# Patient Record
Sex: Male | Born: 1960 | Race: Black or African American | Hispanic: No | Marital: Married | Smoking: Never smoker
Health system: Southern US, Community
[De-identification: ages and names within clinical notes are randomized; demographics above are authoritative.]

## PROBLEM LIST (undated history)

## (undated) DIAGNOSIS — E119 Type 2 diabetes mellitus without complications: Secondary | ICD-10-CM

## (undated) DIAGNOSIS — F4310 Post-traumatic stress disorder, unspecified: Secondary | ICD-10-CM

## (undated) DIAGNOSIS — S46912A Strain of unspecified muscle, fascia and tendon at shoulder and upper arm level, left arm, initial encounter: Secondary | ICD-10-CM

## (undated) DIAGNOSIS — N189 Chronic kidney disease, unspecified: Secondary | ICD-10-CM

## (undated) DIAGNOSIS — G459 Transient cerebral ischemic attack, unspecified: Secondary | ICD-10-CM

## (undated) DIAGNOSIS — I10 Essential (primary) hypertension: Secondary | ICD-10-CM

## (undated) DIAGNOSIS — I2510 Atherosclerotic heart disease of native coronary artery without angina pectoris: Secondary | ICD-10-CM

## (undated) DIAGNOSIS — E785 Hyperlipidemia, unspecified: Secondary | ICD-10-CM

## (undated) DIAGNOSIS — G43909 Migraine, unspecified, not intractable, without status migrainosus: Secondary | ICD-10-CM

## (undated) DIAGNOSIS — K589 Irritable bowel syndrome without diarrhea: Secondary | ICD-10-CM

## (undated) DIAGNOSIS — M25373 Other instability, unspecified ankle: Secondary | ICD-10-CM

## (undated) DIAGNOSIS — M722 Plantar fascial fibromatosis: Secondary | ICD-10-CM

## (undated) DIAGNOSIS — K760 Fatty (change of) liver, not elsewhere classified: Secondary | ICD-10-CM

## (undated) DIAGNOSIS — G4730 Sleep apnea, unspecified: Secondary | ICD-10-CM

## (undated) DIAGNOSIS — M5410 Radiculopathy, site unspecified: Secondary | ICD-10-CM

## (undated) HISTORY — DX: Other instability, unspecified ankle: M25.373

## (undated) HISTORY — DX: Essential (primary) hypertension: I10

## (undated) HISTORY — DX: Atherosclerotic heart disease of native coronary artery without angina pectoris: I25.10

## (undated) HISTORY — DX: Migraine, unspecified, not intractable, without status migrainosus: G43.909

## (undated) HISTORY — DX: Radiculopathy, site unspecified: M54.10

## (undated) HISTORY — DX: Irritable bowel syndrome, unspecified: K58.9

## (undated) HISTORY — DX: Hyperlipidemia, unspecified: E78.5

## (undated) HISTORY — DX: Chronic kidney disease, unspecified: N18.9

## (undated) HISTORY — DX: Transient cerebral ischemic attack, unspecified: G45.9

## (undated) HISTORY — DX: Fatty (change of) liver, not elsewhere classified: K76.0

## (undated) HISTORY — DX: Strain of unspecified muscle, fascia and tendon at shoulder and upper arm level, left arm, initial encounter: S46.912A

## (undated) HISTORY — DX: Plantar fascial fibromatosis: M72.2

## (undated) HISTORY — DX: Sleep apnea, unspecified: G47.30

## (undated) HISTORY — PX: BUNIONECTOMY: SHX129

## (undated) HISTORY — DX: Type 2 diabetes mellitus without complications: E11.9

## (undated) HISTORY — DX: Post-traumatic stress disorder, unspecified: F43.10

---

## 2019-06-21 HISTORY — PX: COLONOSCOPY: SHX174

## 2019-11-21 NOTE — Progress Notes (Signed)
Primary Physician/Referring:  Reynold Bowen, MD  Patient ID: William Savage, male    DOB: [birth year], 59 y.o.   MRN: [id number]  Chief Complaint  Patient presents with  . Chest Pain  . New Patient (Initial Visit)   HPI:    William Savage  is a 59 y.o. African-American ex [employer], hypertension, hyperlipidemia referred to me on urgent basis due to abnormal EKG and chest pain.  Over the past 3 to 4 weeks, patient started noticing chest heaviness in the middle of the chest with stressful situations.  Spontaneously after 15 to 20 minutes.  Described as mild.  He is also noticed episodes of chest tightness with sexual intercourse, last episode was last night.  Again easily relieved with rest.  No shortness of breath, no palpitations, no PND or orthopnea.  Wife present.  Past Medical History:  Diagnosis Date  . Hyperlipidemia   . Hypertension    History reviewed. No pertinent surgical history. Family History  Problem Relation Age of Onset  . Diabetes Mother   . Stroke Mother   . Hypertension Mother   . Unexplained death Father   . Hypertension Sister   . Diabetes Sister   . Hypertension Sister   . Diabetes Sister     Social History   Tobacco Use  . Smoking status: Never Smoker  . Smokeless tobacco: Never Used  Substance Use Topics  . Alcohol use: Not Currently   Marital Status:    ROS  Review of Systems  Cardiovascular: Positive for chest pain. Negative for dyspnea on exertion, leg swelling and syncope.  Gastrointestinal: Negative for melena.   Objective  Blood pressure (!) 172/99, pulse 61, resp. rate 16, height 6' (1.829 m), weight 239 lb (108.4 kg), SpO2 97 %.  Vitals with BMI 11/22/2019 11/22/2019  Height - _0   Weight - 239 lbs  BMI - 63.14  Systolic 970 263  Diastolic 99 785  Pulse 61 68     Physical Exam  Constitutional: He appears well-developed and well-nourished. No distress.  muscular  Cardiovascular: Normal rate, regular rhythm, normal heart sounds and  intact distal pulses. Exam reveals no gallop.  No murmur heard. No leg edema, no JVD.   Pulmonary/Chest: Effort normal and breath sounds normal. No accessory muscle usage.  Abdominal: Soft. Bowel sounds are normal.   Laboratory examination:   No results for input(s): NA, K, CL, CO2, GLUCOSE, BUN, CREATININE, CALCIUM, GFRNONAA, GFRAA in the last 8760 hours. CrCl cannot be calculated (No successful lab value found.).  No flowsheet data found. No flowsheet data found.  External labs:   Lipid Panel completed 10/30/2019 Cholesterol, total 235.000 m 10/30/2019 Triglycerides 238.000 10/30/2019 HDL 39 MG/DL 10/30/2019 LDL 148.000 m 10/30/2019  Glucose Random 85.000 mg 10/30/2019 BUN 12.000 mg 10/30/2019 Creatinine, Serum 1.200 mg/ 10/30/2019  10/30/2019: EGFR 63/76, Na 140.  Medications and allergies  No Known Allergies   Current Outpatient Medications  Medication Instructions  . amLODipine (NORVASC) 10 mg, Oral, Every evening  . aspirin 81 mg, Oral, Daily  . colchicine 0.6 mg, Oral, Daily, As needed for gout   . losartan-hydrochlorothiazide (HYZAAR) 50-12.5 MG tablet 1 tablet, Oral,  Every morning - 10a  . prazosin (MINIPRESS) 1 mg, Oral, Daily at bedtime  . Psyllium (METAMUCIL) 48.57 % POWD Oral  . rosuvastatin (CRESTOR) 20 mg, Oral, Daily  . sertraline (ZOLOFT) 100 mg, Oral, Daily   Radiology:   No results found.  Cardiac Studies:   None  EKG  EKG 11/22/2019:  Normal sinus rhythm with rate of 57 bpm, normal axis, T wave abnormality noted on 11/21/2019 (PCP)  in the inferior leads no longer present.  Normal EKG.  Assessment     ICD-10-CM   1. Angina pectoris (HCC)  I20.9 EKG 12-Lead    PCV MYOCARDIAL PERFUSION WO LEXISCAN    PCV ECHOCARDIOGRAM COMPLETE  2. Primary hypertension  I10 amLODipine (NORVASC) 10 MG tablet    Basic metabolic panel    losartan-hydrochlorothiazide (HYZAAR) 50-12.5 MG tablet    PCV MYOCARDIAL PERFUSION WO LEXISCAN    PCV ECHOCARDIOGRAM COMPLETE  3.  Hypercholesteremia  E78.00 PCV MYOCARDIAL PERFUSION WO LEXISCAN    rosuvastatin (CRESTOR) 20 MG tablet     Recommendations:   William Savage  is a 59 y.o. African-American ex [employer], hypertension, hyperlipidemia referred to me on urgent basis due to abnormal EKG and chest pain.  Over the past 3 to 4 weeks, patient started noticing chest heaviness in the middle of the chest with stressful situations and also during sexual intercourse.   His blood pressure is elevated, lipids reviewed from Dr. Forde Dandy, concerned about his symptoms which have been indicator of angina pectoris.  Will increase amlodipine to 10 mg in the evening, added losartan HCT 50/12.5 mg in the morning.  Will increase Crestor that was prescribed yesterday from 5 mg to 20 mg in view of suspicion for new onset angina pectoris to be aggressive with risk modification.  Eventually he may need only 10 mg.  Changes with lifestyle including diet discussed with the patient and his wife.  Continue aspirin 81 mg that he started yesterday.  Schedule for a Exercise Nuclear stress test to evaluate for myocardial ischemia. Will schedule for an echocardiogram. OV in 4-6 weeks if stable or sooner if recurrent symptoms or high risk stress test.    Adrian Prows, MD, [facility] 11/22/2019, 11:59 AM [facility] [place] Pager: [phone] Office: [phone]

## 2019-11-22 ENCOUNTER — Ambulatory Visit: Payer: Federal, State, Local not specified - PPO | Admitting: Cardiology

## 2019-11-22 ENCOUNTER — Other Ambulatory Visit: Payer: Self-pay

## 2019-11-22 ENCOUNTER — Encounter: Payer: Self-pay | Admitting: Cardiology

## 2019-11-22 VITALS — BP 172/99 | HR 61 | Resp 16 | Ht 72.0 in | Wt 239.0 lb

## 2019-11-22 DIAGNOSIS — I10 Essential (primary) hypertension: Secondary | ICD-10-CM

## 2019-11-22 DIAGNOSIS — I209 Angina pectoris, unspecified: Secondary | ICD-10-CM

## 2019-11-22 DIAGNOSIS — E7800 Pure hypercholesterolemia, unspecified: Secondary | ICD-10-CM

## 2019-11-22 MED ORDER — AMLODIPINE BESYLATE 10 MG PO TABS: 10.0000 mg | ORAL_TABLET | Freq: Every evening | ORAL | 1 refills | Status: DC

## 2019-11-22 MED ORDER — ROSUVASTATIN CALCIUM 20 MG PO TABS: 20.0000 mg | ORAL_TABLET | Freq: Every day | ORAL | 2 refills | Status: DC

## 2019-11-22 MED ORDER — LOSARTAN POTASSIUM-HCTZ 50-12.5 MG PO TABS: 1.0000 | ORAL_TABLET | Freq: Every morning | ORAL | 2 refills | Status: DC

## 2019-11-28 ENCOUNTER — Ambulatory Visit: Payer: Federal, State, Local not specified - PPO

## 2019-11-28 ENCOUNTER — Other Ambulatory Visit: Payer: Self-pay

## 2019-11-28 DIAGNOSIS — I10 Essential (primary) hypertension: Secondary | ICD-10-CM

## 2019-11-28 DIAGNOSIS — I209 Angina pectoris, unspecified: Secondary | ICD-10-CM

## 2019-11-30 LAB — BASIC METABOLIC PANEL
BUN/Creatinine Ratio: 11 (ref 9–20)
BUN: 12 mg/dL (ref 6–24)
CO2: 26 mmol/L (ref 20–29)
Calcium: 10 mg/dL (ref 8.7–10.2)
Chloride: 103 mmol/L (ref 96–106)
Creatinine, Ser: 1.11 mg/dL (ref 0.76–1.27)
GFR calc Af Amer: 85 mL/min/{1.73_m2} (ref >59)
GFR calc non Af Amer: 74 mL/min/{1.73_m2} (ref >59)
Glucose: 84 mg/dL (ref 65–99)
Potassium: 4.3 mmol/L (ref 3.5–5.2)
Sodium: 142 mmol/L (ref 134–144)

## 2019-12-09 ENCOUNTER — Other Ambulatory Visit: Payer: Federal, State, Local not specified - PPO

## 2019-12-18 ENCOUNTER — Other Ambulatory Visit: Payer: Self-pay

## 2019-12-18 ENCOUNTER — Ambulatory Visit: Payer: Federal, State, Local not specified - PPO

## 2019-12-18 DIAGNOSIS — I10 Essential (primary) hypertension: Secondary | ICD-10-CM

## 2019-12-18 DIAGNOSIS — I209 Angina pectoris, unspecified: Secondary | ICD-10-CM

## 2019-12-18 DIAGNOSIS — E7800 Pure hypercholesterolemia, unspecified: Secondary | ICD-10-CM

## 2019-12-24 NOTE — Telephone Encounter (Signed)
From pt please read

## 2020-01-02 ENCOUNTER — Other Ambulatory Visit: Payer: Self-pay

## 2020-01-02 ENCOUNTER — Encounter: Payer: Self-pay | Admitting: Cardiology

## 2020-01-02 ENCOUNTER — Ambulatory Visit: Payer: Federal, State, Local not specified - PPO | Admitting: Cardiology

## 2020-01-02 VITALS — BP 141/84 | HR 61 | Resp 16 | Ht 72.0 in | Wt 241.4 lb

## 2020-01-02 DIAGNOSIS — I209 Angina pectoris, unspecified: Secondary | ICD-10-CM

## 2020-01-02 DIAGNOSIS — E7800 Pure hypercholesterolemia, unspecified: Secondary | ICD-10-CM

## 2020-01-02 DIAGNOSIS — I10 Essential (primary) hypertension: Secondary | ICD-10-CM

## 2020-01-02 DIAGNOSIS — N522 Drug-induced erectile dysfunction: Secondary | ICD-10-CM

## 2020-01-02 DIAGNOSIS — G4733 Obstructive sleep apnea (adult) (pediatric): Secondary | ICD-10-CM

## 2020-01-02 MED ORDER — ROSUVASTATIN CALCIUM 20 MG PO TABS: 20.0000 mg | ORAL_TABLET | Freq: Every day | ORAL | 2 refills | Status: DC

## 2020-01-02 MED ORDER — SILDENAFIL CITRATE 100 MG PO TABS: 100.0000 mg | ORAL_TABLET | Freq: Every day | ORAL | 3 refills | Status: DC | PRN

## 2020-01-02 MED ORDER — NITROGLYCERIN 0.4 MG SL SUBL: 0.4000 mg | SUBLINGUAL_TABLET | SUBLINGUAL | 3 refills | Status: DC | PRN

## 2020-01-02 MED ORDER — LOSARTAN POTASSIUM-HCTZ 100-12.5 MG PO TABS: 1.0000 | ORAL_TABLET | ORAL | 3 refills | Status: DC

## 2020-01-02 NOTE — Progress Notes (Signed)
Primary Physician/Referring:  Reynold Bowen, MD  Patient ID: William Savage, male    DOB: 1960/09/09, 59 y.o.   MRN: [id number]  Chief Complaint  Patient presents with   Hypertension   Chest Pain   Follow-up    6 week   HPI:    William Savage  is a 59 y.o. African-American ex [employer], hypertension, hyperlipidemia referred to me on urgent basis due to abnormal EKG and chest pain.  Over the past 3 to 4 weeks, patient started noticing chest heaviness in the middle of the chest with stressful situations.  Spontaneously after 15 to 20 minutes.  Described as mild.  He is also noticed episodes of chest tightness with sexual intercourse, last episode was last night.  Again easily relieved with rest.  No shortness of breath, no palpitations, no PND or orthopnea.  Wife present.  I seen him 6 weeks ago for symptoms suggestive of angina, nuclear stress test in June 2021 and echocardiogram June 2021 was low risk and normal respectively.  He has noticed improvement in symptoms of angina since increasing the dose of Crestor and also blood pressure control.  He complains of erectile dysfunction that has been ongoing for a while and requests help.  Past Medical History:  Diagnosis Date   Hyperlipidemia    Hypertension    History reviewed. No pertinent surgical history. Family History  Problem Relation Age of Onset   Diabetes Mother    Stroke Mother    Hypertension Mother    Unexplained death Father    Hypertension Sister    Diabetes Sister    Hypertension Sister    Diabetes Sister     Social History   Tobacco Use   Smoking status: Never Smoker   Smokeless tobacco: Never Used  Substance Use Topics   Alcohol use: Not Currently   Marital Status:    ROS  Review of Systems  Cardiovascular: Positive for chest pain. Negative for dyspnea on exertion, leg swelling and syncope.  Respiratory: Positive for snoring (has diagnosis of OSA ).   Gastrointestinal: Negative for melena.   Genitourinary: Positive for decreased libido.   Objective  Blood pressure (!) 141/84, pulse 61, resp. rate 16, height 6' (1.829 m), weight 241 lb 6.4 oz (109.5 kg), SpO2 97 %.  Vitals with BMI 01/02/2020 01/02/2020 11/22/2019  Height - 6' 0"  -  Weight - 241 lbs 6 oz -  BMI - 40.98 -  Systolic 119 147 829  Diastolic 84 93 99  Pulse 61 62 61     Physical Exam Constitutional:      General: He is not in acute distress.    Appearance: He is well-developed.     Comments: muscular  Cardiovascular:     Rate and Rhythm: Normal rate and regular rhythm.     Pulses: Intact distal pulses.     Heart sounds: Normal heart sounds. No murmur heard.  No gallop.      Comments: No leg edema, no JVD.  Pulmonary:     Effort: Pulmonary effort is normal. No accessory muscle usage.     Breath sounds: Normal breath sounds.  Abdominal:     General: Bowel sounds are normal.     Palpations: Abdomen is soft.    Laboratory examination:   Recent Labs    11/29/19 1147  NA 142  K 4.3  CL 103  CO2 26  GLUCOSE 84  BUN 12  CREATININE 1.11  CALCIUM 10.0  GFRNONAA 74  GFRAA 85  CrCl cannot be calculated (Patient's most recent lab result is older than the maximum 21 days allowed.).  CMP Latest Ref Rng & Units 11/29/2019  Glucose 65 - 99 mg/dL 84  BUN 6 - 24 mg/dL 12  Creatinine 0.76 - 1.27 mg/dL 1.11  Sodium 134 - 144 mmol/L 142  Potassium 3.5 - 5.2 mmol/L 4.3  Chloride 96 - 106 mmol/L 103  CO2 20 - 29 mmol/L 26  Calcium 8.7 - 10.2 mg/dL 10.0   No flowsheet data found.  External labs:   Lipid Panel completed 10/30/2019 Cholesterol, total 235.000 m 10/30/2019 Triglycerides 238.000 10/30/2019 HDL 39 MG/DL 10/30/2019 LDL 148.000 m 10/30/2019  Glucose Random 85.000 mg 10/30/2019 BUN 12.000 mg 10/30/2019 Creatinine, Serum 1.200 mg/ 10/30/2019  10/30/2019: EGFR 63/76, Na 140.  Medications and allergies  No Known Allergies   Current Outpatient Medications  Medication Instructions   amLODipine  (NORVASC) 10 mg, Oral, Every evening   aspirin 81 mg, Oral, Daily   colchicine 0.6 mg, Oral, Daily, As needed for gout    losartan-hydrochlorothiazide (HYZAAR) 100-12.5 MG tablet 1 tablet, Oral, BH-each morning   nitroGLYCERIN (NITROSTAT) 0.4 mg, Sublingual, Every 5 min PRN   prazosin (MINIPRESS) 1 mg, Oral, Daily at bedtime   Psyllium (METAMUCIL) 48.57 % POWD Oral   rosuvastatin (CRESTOR) 20 mg, Oral, Daily   sertraline (ZOLOFT) 100 mg, Oral, Daily   sildenafil (VIAGRA) 100 mg, Oral, Daily PRN   Radiology:   No results found.  Cardiac Studies:   Echocardiogram 11/28/2019:  Normal LV systolic function with visual EF 60-65%. Left ventricle cavity  is normal in size. Mild to moderate hypertrophy of the left ventricle.  Normal global wall motion. Normal diastolic filling pattern, normal LAP.  Calculated EF 59%.  Mild pulmonic regurgitation.  No prior study for comparison.  Exercise Sestamibi stress test 12/18/2019: Exercise nuclear stress test was performed using Bruce protocol. Patient reached 13.2 METS, and 88% of age predicted maximum heart rate. Exercise capacity was excellent. Chest pain not reported. Heart rate and hemodynamic response were normal. Stress EKG revealed no ischemic changes. Normal myocardial perfusion. Stress LVEF 53%.  EKG  EKG 11/22/2019: Normal sinus rhythm with rate of 57 bpm, normal axis, T wave abnormality noted on 11/21/2019 (PCP)  in the inferior leads no longer present.  Normal EKG.  Assessment     ICD-10-CM   1. Angina pectoris ([place])  I20.9 Ambulatory referral to Sleep Studies    nitroGLYCERIN (NITROSTAT) 0.4 MG SL tablet  2. OSA (obstructive sleep apnea)  G47.33 Ambulatory referral to Sleep Studies  3. Primary hypertension  I10 losartan-hydrochlorothiazide (HYZAAR) 100-12.5 MG tablet  4. Drug-induced erectile dysfunction  N52.2 sildenafil (VIAGRA) 100 MG tablet  5. Hypercholesteremia  E78.00 rosuvastatin (CRESTOR) 20 MG tablet    Lipid  Panel With LDL/HDL Ratio    Lipid Panel With LDL/HDL Ratio     Recommendations:   William Savage  is a 59 y.o. African-American ex [employer], hypertension, hyperlipidemia referred to me on urgent basis due to abnormal EKG and chest pain.  Over the past 3 to 4 weeks, patient started noticing chest heaviness in the middle of the chest with stressful situations and also during sexual intercourse.  Since increasing the dose of Crestor and antihypertensive medications, amlodipine from 5 to 10 mg and addition of losartan HCT 50/12.5 mg, he has noticed improvement in symptoms of chest pain, he is still having occasional episodes of exertional chest pain and also chest pain during intercourse.  Today the blood  pressure is still elevated but much improved.  Will increase losartan HCT to 100/12.5 mg every morning.  Continue Crestor 40 mg daily and also amlodipine 10 mg daily.  For erectile dysfunction I have prescribed Viagra and advised him regarding interaction with sublingual nitroglycerin which I prescribed today for angina pectoris to be used on a as needed basis.  He has diagnosis of sleep apnea but not using CPAP.  He continues to have fatigue and elevated blood pressure may be also be related to sleep apnea.  In view of angina pectoris and hypertension, I will refer him to be evaluated by Dr. Rexene Alberts.  I would like to see him back in 3 months or sooner if problems.  I will obtain lipid profile testing prior to his office visit.  This was a 40-minute encounter with discussions regarding angina pectoris, sleep apnea, hypertension, obesity.   Adrian Prows, MD, [facility] 01/02/2020, 12:52 PM Office: [phone]

## 2020-02-10 ENCOUNTER — Institutional Professional Consult (permissible substitution): Payer: Self-pay | Admitting: Neurology

## 2020-02-17 ENCOUNTER — Other Ambulatory Visit: Payer: Self-pay | Admitting: Cardiology

## 2020-03-08 ENCOUNTER — Encounter: Payer: Self-pay | Admitting: Neurology

## 2020-03-09 ENCOUNTER — Ambulatory Visit: Payer: Federal, State, Local not specified - PPO | Admitting: Neurology

## 2020-03-09 ENCOUNTER — Other Ambulatory Visit: Payer: Self-pay

## 2020-03-09 ENCOUNTER — Encounter: Payer: Self-pay | Admitting: Neurology

## 2020-03-09 VITALS — BP 126/82 | HR 62 | Ht 72.0 in | Wt 236.3 lb

## 2020-03-09 DIAGNOSIS — E669 Obesity, unspecified: Secondary | ICD-10-CM

## 2020-03-09 DIAGNOSIS — R635 Abnormal weight gain: Secondary | ICD-10-CM | POA: Diagnosis not present

## 2020-03-09 DIAGNOSIS — G4733 Obstructive sleep apnea (adult) (pediatric): Secondary | ICD-10-CM | POA: Diagnosis not present

## 2020-03-09 DIAGNOSIS — G4719 Other hypersomnia: Secondary | ICD-10-CM

## 2020-03-09 DIAGNOSIS — Z9989 Dependence on other enabling machines and devices: Secondary | ICD-10-CM

## 2020-03-09 NOTE — Patient Instructions (Signed)
It was nice to meet you today.  As discussed, I would like for you to have a local DME company which is a durable medical equipment company that would keep you updated with all your AutoPap related supplies. I will request that your maximum pressure be reduced so you have less feeling of getting too much air. Please try to use your machine consistently every night.  It would help to get a copy of your original sleep study from 2018.  If need be, we can consider repeating your sleep study since you also had some weight fluctuation.  Please follow-up to see one of our nurse practitioners in 3 months.

## 2020-03-09 NOTE — Progress Notes (Signed)
Subjective:    Patient ID: William Savage is a 59 y.o. male.  HPI     William Foley, MD, PhD [facility] [address], [address] [address] [place], [place] [address]  Dear Vonna Kotyk,   I saw your patient, William Savage, upon your kind request, in my sleep clinic today for Initial consultation of his sleep disorder, in particular, evaluation of his prior diagnosis of sleep apnea.  The patient is accompanied by his wife today.  As you know, William Savage is a 59 year old right-handed gentleman with an underlying medical history of hypertension, hyperlipidemia, angina, ED, and obesity, who was previously diagnosed with obstructive sleep apnea and placed on PAP therapy.  He has not been using his machine consistently. He brought his autoPAP machine and I was able to review his compliance: In the past 90 days from 12/10/2019 through 03/08/2020 he used his machine 16 days with percent use days greater than 4 hours at 13% only, indicating suboptimal compliance, he recently restarted using his machine on 02/09/2020. Residual AHI is 0.3/h, average pressure for the 95th percentile at 8.6 cm with a maximum of 10 cm, range of 4 to 20 cm which is the default setting. 95th percentile of leak at 8.4 L/min. He uses a full facemask. He reports that he got this machine in 2018 and has not received any new supplies. Prior sleep study results are not available for my review today, [place] in [place].  I reviewed your office note from 01/02/2020.  His Epworth sleepiness score is 18 out of 24, fatigue severity score is 44 out of 63. He reports not waking up rested. He has sleep disruption. He reports that he has nightmares at times. He also reports that he has PTSD. Generally, he tries to be in bed around midnight and wake up time is around 8 AM. He has nocturia about once or twice per average night. He does not drink alcohol, is a non-smoker and drinks caffeine in limitation, about 1cup/day in the  morning. He has had some weight gain in the past year, around 12 pounds.   His Past Medical History Is Significant For: Past Medical History:  Diagnosis Date  . Hyperlipidemia   . Hypertension     His Past Surgical History Is Significant For: No past surgical history on file.  His Family History Is Significant For: Family History  Problem Relation Age of Onset  . Diabetes Mother   . Stroke Mother   . Hypertension Mother   . Unexplained death Father   . Hypertension Sister   . Diabetes Sister   . Hypertension Sister   . Diabetes Sister     His Social History Is Significant For: Social History   Socioeconomic History  . Marital status: Married    Spouse name: Not on file  . Number of children: 2  . Years of education: Not on file  . Highest education level: Not on file  Occupational History  . Not on file  Tobacco Use  . Smoking status: Never Smoker  . Smokeless tobacco: Never Used  Vaping Use  . Vaping Use: Never used  Substance and Sexual Activity  . Alcohol use: Not Currently  . Drug use: Never  . Sexual activity: Not on file  Other Topics Concern  . Not on file  Social History Narrative  . Not on file   Social Determinants of Health   Financial Resource Strain:   . Difficulty of Paying Living Expenses: Not  on file  Food Insecurity:   . Worried About Programme researcher, broadcasting/film/video in the Last Year: Not on file  . Ran Out of Food in the Last Year: Not on file  Transportation Needs:   . Lack of Transportation (Medical): Not on file  . Lack of Transportation (Non-Medical): Not on file  Physical Activity:   . Days of Exercise per Week: Not on file  . Minutes of Exercise per Session: Not on file  Stress:   . Feeling of Stress : Not on file  Social Connections:   . Frequency of Communication with Friends and Family: Not on file  . Frequency of Social Gatherings with Friends and Family: Not on file  . Attends Religious Services: Not on file  . Active Member of  Clubs or Organizations: Not on file  . Attends Banker Meetings: Not on file  . Marital Status: Not on file    His Allergies Are:  No Known Allergies:   His Current Medications Are:  Outpatient Encounter Medications as of 03/09/2020  Medication Sig  . amLODipine (NORVASC) 10 MG tablet Take 1 tablet (10 mg total) by mouth every evening.  Marland Kitchen aspirin 81 MG chewable tablet Chew 81 mg by mouth daily.  . colchicine 0.6 MG tablet Take 0.6 mg by mouth daily. As needed for gout  . losartan-hydrochlorothiazide (HYZAAR) 100-12.5 MG tablet Take 1 tablet by mouth every morning.  Marland Kitchen losartan-hydrochlorothiazide (HYZAAR) 50-12.5 MG tablet TAKE 1 TABLET BY MOUTH EVERY MORNING.  . prazosin (MINIPRESS) 1 MG capsule Take 1 mg by mouth at bedtime.  . Psyllium (METAMUCIL) 48.57 % POWD Take by mouth.  . rosuvastatin (CRESTOR) 20 MG tablet Take 1 tablet (20 mg total) by mouth daily.  . sertraline (ZOLOFT) 100 MG tablet Take 100 mg by mouth daily.  . sildenafil (VIAGRA) 100 MG tablet Take 1 tablet (100 mg total) by mouth daily as needed for erectile dysfunction.  . nitroGLYCERIN (NITROSTAT) 0.4 MG SL tablet Place 1 tablet (0.4 mg total) under the tongue every 5 (five) minutes as needed for up to 25 days for chest pain.   No facility-administered encounter medications on file as of 03/09/2020.  :  Review of Systems:  Out of a complete 14 point review of systems, all are reviewed and negative with the exception of these symptoms as listed below: Review of Systems  Neurological:       Here for sleep consult. Pt reports prior sleep study and has been on cpap for a few years. Pt reports he has struggled to use consistently since. He currently does not have a local DME and would like to discuss getting set up with one.  Epworth Sleepiness Scale 0= would never doze 1= slight chance of dozing 2= moderate chance of dozing 3= high chance of dozing  Sitting and reading:3 Watching TV:3 Sitting inactive  in a public place (ex. Theater or meeting):1 As a passenger in a car for an hour without a break:2 Lying down to rest in the afternoon:3 Sitting and talking to someone:1 Sitting quietly after lunch (no alcohol):3 In a car, while stopped in traffic:2 Total: 18     Objective:  Neurological Exam  Physical Exam Physical Examination:   Vitals:   03/09/20 1104  BP: 126/82  Pulse: 62  SpO2: 97%    General Examination: The patient is a very pleasant 59 y.o. male in no acute distress. He appears well-developed and well-nourished and well groomed.   HEENT: Normocephalic, atraumatic, pupils  are equal, round and reactive to light, extraocular tracking is good without limitation to gaze excursion or nystagmus noted. Hearing is grossly intact. Face is symmetric with normal facial animation. Speech is clear with no dysarthria noted. There is no hypophonia. There is no lip, neck/head, jaw or voice tremor. Neck is supple with full range of passive and active motion. There are no carotid bruits on auscultation. Oropharynx exam reveals: moderate mouth dryness, adequate dental hygiene and moderate airway crowding. Tongue protrudes centrally and palate elevates symmetrically. Neck circumference is 16-1/4 inches.  Chest: Clear to auscultation without wheezing, rhonchi or crackles noted.  Heart: S1+S2+0, regular and normal without murmurs, rubs or gallops noted.   Abdomen: Soft, non-tender and non-distended with normal bowel sounds appreciated on auscultation.  Extremities: There is no obv. Edema.  Skin: Warm and dry without trophic changes noted.   Musculoskeletal: exam reveals some discomfort reported in the left knee as well as low back pain reported.  Neurologically:  Mental status: The patient is awake, alert and oriented in all 4 spheres. His immediate and remote memory, attention, language skills and fund of knowledge are fairly appropriate. There is no evidence of aphasia, agnosia, apraxia  or anomia. Speech is clear with normal prosody and enunciation. Thought process is linear. Mood is normal.  Cranial nerves II - XII are as described above under HEENT exam.  Motor exam: Normal bulk, strength and tone is noted. There is no tremor, fine motor skills and coordination: grossly intact.  Cerebellar testing: No dysmetria or intention tremor. There is no truncal or gait ataxia.   Gait, station and balance:  stands easily. No veering to one side is noted. No leaning to one side is noted. Posture is age-appropriate and stance is narrow based. Gait shows normal stride length and normal pace. No problems turning are noted.   Assessment and Plan:   In summary, William Savage is a very pleasant 59 y.o.-year old male with an underlying medical history of hypertension, hyperlipidemia, angina, ED, and obesity, who presents for evaluation of his sleep disorder, in particular, his prior diagnosis of obstructive sleep apnea. He has been on AutoPap therapy, recently restarted using the machine, has not been consistent with the AutoPap machine. He reports discomfort with the pressure. He has a full facemask. He is advised that we could seek a mask refit and pressure reduction through a local DME company. He is willing to establish with a local company. We may need a copy of his sleep study. I certainly would like to know the severity of his sleep apnea. Per his recollection he had moderate sleep apnea. Sleep study testing was in 2018. If need be, he would be willing to get reevaluated with another sleep study. For now, we will see if we can have him send [place] a copy of his prior sleep study results. He would be willing to try to get a copy. He reports that testing was done through [facility] in [place] in 2018. I suggested follow-up with one of our nurse practitioners in about 2 to 3 months to reevaluate his symptoms and compliance. In the interim we will send his DME order to a local DME company. I answered  all their questions today and the patient and his wife are in agreement.  Thank you very much for allowing me to participate in the care of this nice patient. If I can be of any further assistance to you please do not hesitate to call me at [phone].  Sincerely,   Star Age, MD, PhD

## 2020-04-08 ENCOUNTER — Encounter: Payer: Self-pay | Admitting: Cardiology

## 2020-04-08 ENCOUNTER — Other Ambulatory Visit: Payer: Self-pay

## 2020-04-08 ENCOUNTER — Ambulatory Visit: Payer: Federal, State, Local not specified - PPO | Admitting: Cardiology

## 2020-04-08 VITALS — BP 120/86 | HR 59 | Resp 14 | Ht 72.0 in | Wt 236.6 lb

## 2020-04-08 DIAGNOSIS — E7800 Pure hypercholesterolemia, unspecified: Secondary | ICD-10-CM

## 2020-04-08 DIAGNOSIS — G4733 Obstructive sleep apnea (adult) (pediatric): Secondary | ICD-10-CM

## 2020-04-08 DIAGNOSIS — I10 Essential (primary) hypertension: Secondary | ICD-10-CM

## 2020-04-08 NOTE — Progress Notes (Signed)
Primary Physician/Referring:  Reynold Bowen, MD  Patient ID: William Savage, male    DOB: 11-26-1960, 59 y.o.   MRN: [id number]  Chief Complaint  Patient presents with  . Chest Pain  . Hypertension  . Follow-up    3 month   HPI:    William Savage  is a 59 y.o. African-American ex [employer], hypertension, hyperlipidemia originally referred to our office due to abnormal EKG and chest pain.  Nuclear stress test and echocardiogram in June 2021 were low risk and normal respectively.  Symptoms have improved with medical management and blood pressure control.    Patient presents for 42-monthfollow-up.  At last visit increase losartan/HCTZ to 100/12.5 daily and prescribed Viagra for erectile dysfunction.  Patient reports he is presently doing well, without recurrence of anginal symptoms and erectile dysfunction has improved with use of Viagra.  Denies chest pain, shortness of breath, palpitations, PND, orthopnea.  He has been diagnosed with sleep apnea in the past, however he only uses his CPAP 2-3 times per week.  He is following with Dr. ARexene Alberts who is working on adjusting CPAP machine settings and mask for improved patient compliance.  Patient does not check his blood pressure regularly at home.  He has not used nitroglycerin since last visit.  Past Medical History:  Diagnosis Date  . Hyperlipidemia   . Hypertension    History reviewed. No pertinent surgical history. Family History  Problem Relation Age of Onset  . Diabetes Mother   . Stroke Mother   . Hypertension Mother   . Unexplained death Father   . Hypertension Sister   . Diabetes Sister   . Hypertension Sister   . Diabetes Sister     Social History   Tobacco Use  . Smoking status: Never Smoker  . Smokeless tobacco: Never Used  Substance Use Topics  . Alcohol use: Not Currently   Marital Status:    ROS  Review of Systems  Constitutional: Negative for malaise/fatigue.  Cardiovascular: Negative for chest pain, dyspnea on  exertion, leg swelling, near-syncope, orthopnea, palpitations, paroxysmal nocturnal dyspnea and syncope.  Respiratory: Positive for snoring (has diagnosis of OSA , not compliant with CPAP).   Gastrointestinal: Negative for melena.  Neurological: Negative for dizziness.   Objective  Blood pressure 135/88, pulse (!) 59, resp. rate 14, height 6' (1.829 m), weight 236 lb 9.6 oz (107.3 kg), SpO2 99 %.  Vitals with BMI 04/08/2020 03/09/2020 01/02/2020  Height 6' 0"  6' 0"  -  Weight 236 lbs 10 oz 236 lbs 5 oz -  BMI 376.54365.03-  Systolic [id number] Diastolic 88 82 84  Pulse 59 62 61     Physical Exam Vitals reviewed.  Constitutional:      General: He is not in acute distress.    Appearance: He is well-developed.     Comments: muscular  HENT:     Head: Normocephalic and atraumatic.  Cardiovascular:     Rate and Rhythm: Normal rate and regular rhythm.     Pulses: Intact distal pulses.     Heart sounds: Normal heart sounds, S1 normal and S2 normal. No murmur heard.  No gallop.      Comments: No leg edema, no JVD.  Pulmonary:     Effort: Pulmonary effort is normal. No accessory muscle usage or respiratory distress.     Breath sounds: Normal breath sounds. No wheezing, rhonchi or rales.  Abdominal:     General: Bowel sounds are normal.  Palpations: Abdomen is soft.  Musculoskeletal:     Right lower leg: No edema.     Left lower leg: No edema.  Neurological:     Mental Status: He is alert.    Laboratory examination:   CMP Latest Ref Rng & Units 11/29/2019  Glucose 65 - 99 mg/dL 84  BUN 6 - 24 mg/dL 12  Creatinine 0.76 - 1.27 mg/dL 1.11  Sodium 134 - 144 mmol/L 142  Potassium 3.5 - 5.2 mmol/L 4.3  Chloride 96 - 106 mmol/L 103  CO2 20 - 29 mmol/L 26  Calcium 8.7 - 10.2 mg/dL 10.0   No flowsheet data found. Lipid Panel  No results found for: CHOL, TRIG, HDL, CHOLHDL, VLDL, LDLCALC, LDLDIRECT HEMOGLOBIN A1C No results found for: HGBA1C, MPG TSH No results for input(s):  TSH in the last 8760 hours.  No flowsheet data found.  External labs:  03/25/2020:  Total cholesterol 131, triglycerides 86, HDL 36, LDL 78 BUN 12, creatinine 1.2, GFR 62, sodium 140, potassium 4.2,  BMP otherwise normal  Lipid Panel completed 10/30/2019 Cholesterol, total 235.000 m 10/30/2019 Triglycerides 238.000 10/30/2019 HDL 39 MG/DL 10/30/2019 LDL 148.000 m 10/30/2019  Glucose Random 85.000 mg 10/30/2019 BUN 12.000 mg 10/30/2019 Creatinine, Serum 1.200 mg/ 10/30/2019  10/30/2019: EGFR 63/76, Na 140.  Medications and allergies  No Known Allergies   Current Outpatient Medications  Medication Instructions  . amLODipine (NORVASC) 10 mg, Oral, Every evening  . aspirin 81 mg, Oral, Daily  . colchicine 0.6 mg, Oral, Daily, As needed for gout   . losartan-hydrochlorothiazide (HYZAAR) 100-12.5 MG tablet 1 tablet, Oral, BH-each morning  . losartan-hydrochlorothiazide (HYZAAR) 50-12.5 MG tablet TAKE 1 TABLET BY MOUTH EVERY MORNING.  . nitroGLYCERIN (NITROSTAT) 0.4 mg, Sublingual, Every 5 min PRN  . prazosin (MINIPRESS) 1 mg, Oral, Daily at bedtime  . Psyllium (METAMUCIL) 48.57 % POWD Oral  . rosuvastatin (CRESTOR) 20 mg, Oral, Daily  . sertraline (ZOLOFT) 100 mg, Oral, Daily  . sildenafil (VIAGRA) 100 mg, Oral, Daily PRN   Radiology:   No results found.  Cardiac Studies:   Echocardiogram 11/28/2019:  Normal LV systolic function with visual EF 60-65%. Left ventricle cavity  is normal in size. Mild to moderate hypertrophy of the left ventricle.  Normal global wall motion. Normal diastolic filling pattern, normal LAP.  Calculated EF 59%.  Mild pulmonic regurgitation.  No prior study for comparison.  Exercise Sestamibi stress test 12/18/2019: Exercise nuclear stress test was performed using Bruce protocol. Patient reached 13.2 METS, and 88% of age predicted maximum heart rate. Exercise capacity was excellent. Chest pain not reported. Heart rate and hemodynamic response were normal.  Stress EKG revealed no ischemic changes. Normal myocardial perfusion. Stress LVEF 53%.  EKG  EKG 11/22/2019: Normal sinus rhythm with rate of 57 bpm, normal axis, T wave abnormality noted on 11/21/2019 (PCP)  in the inferior leads no longer present.  Normal EKG.  Assessment     ICD-10-CM   1. Primary hypertension  I10   2. Hypercholesteremia  E78.00   3. OSA (obstructive sleep apnea)  G47.33      Recommendations:   William Savage  is a 59 y.o. African-American ex [employer], hypertension, hyperlipidemia originally referred to our office due to abnormal EKG and chest pain.  Nuclear stress test and echocardiogram in June 2021 were low risk and normal respectively.  Symptoms have improved with medical management and blood pressure control.    Patient presents for follow-up, presently doing well without recurrence of angina.  Blood pressure was originally elevated in the office today, upon recheck well-controlled 120/86.  Encourage patient to continue to monitor blood pressure at home on a regular basis.  However as blood pressure is well controlled will continue amlodipine 10 mg daily, losartan/HCTZ 100/12.5 mg daily. Instructed patient to notify our office if blood pressure on home readings remains consistently >353 systolic.  Reviewed external labs from PCP, lipids are well controlled.  Will continue rosuvastatin 20 mg daily. S/L NTG was again explained how to and when to use it and to notify us if there is change in frequency of use. Interaction with cialis-like agents was discussed, patient verbalized understanding.   Of note he is currently only wearing his CPAP 2-3 nights per week.  Encouraged him to continue to follow with Dr. Rexene Alberts in order to improve tolerability of CPAP for sleep apnea.  Follow-up in 6 months for hypertension, hyperlipidemia.  Patient was seen in collaboration with Dr. Einar Gip and is in agreement of the plan.    William Berthold, PA-C 04/08/2020, 2:06 PM Office:  [phone]

## 2020-04-10 NOTE — Progress Notes (Signed)
Labs 03/25/2020:  Total cholesterol 131, triglycerides eighty-six, HDL thirty-six, LDL seventy-eight, non-HDL cholesterol ninety-five.  Total protein 8.2, albumin 4.2, LFTs including AST and ALT normal.  Serum glucose 85 mg, BUN twelve, creatinine 1.2, EGFR >62 mL.  Sodium 140, potassium 4.2.

## 2020-06-11 ENCOUNTER — Other Ambulatory Visit: Payer: Self-pay | Admitting: Cardiology

## 2020-06-11 DIAGNOSIS — I10 Essential (primary) hypertension: Secondary | ICD-10-CM

## 2020-06-23 ENCOUNTER — Ambulatory Visit: Payer: Federal, State, Local not specified - PPO | Admitting: Family Medicine

## 2020-06-23 ENCOUNTER — Encounter: Payer: Self-pay | Admitting: Family Medicine

## 2020-06-23 VITALS — BP 114/70 | HR 53 | Ht 72.0 in | Wt 238.0 lb

## 2020-06-23 DIAGNOSIS — G4733 Obstructive sleep apnea (adult) (pediatric): Secondary | ICD-10-CM | POA: Diagnosis not present

## 2020-06-23 DIAGNOSIS — Z9989 Dependence on other enabling machines and devices: Secondary | ICD-10-CM

## 2020-06-23 NOTE — Progress Notes (Addendum)
PATIENT: William Savage DOB: 05/10/1961  REASON FOR VISIT: follow up HISTORY FROM: patient  Chief Complaint  Patient presents with  . Follow-up    Rm 2, alone , doing well on cpap      HISTORY OF PRESENT ILLNESS: Today 06/23/20 William Savage is a 60 y.o. male here today for follow up for OSA on CPAP. He was seen by Dr Rexene Alberts in 02/2020 when he had restarted CPAP therapy. Pressure settings were adjusted as he felt they were too strong. He reports that he has tried to use CPAP "on and off" since last being seen. He feels that pressure settings are fine. He is using a full face mask. He reports difficulty with anxiety, depression and [facility]. He feels claustrophobic at times in full face mask. He continues to feel tired and has significant daytime sleepiness.   Compliance report dated 05/23/2020 through 06/22/2019 reveals that he used CPAP 3 of the past 30 days for compliance of 10%.  He used CPAP greater than 4 hours to of the past 30 days for compliance of 7%.  Average usage on days used was 4 hours and 44 minutes.  Residual AHI was 0.1 on 4 to 20 cm of water and an EPR of 2.  Pressure settings in the 95th percentile of 7.9.  There was no significant leak.   HISTORY: (copied from previous note)  Dear Ulice Dash,   I saw your patient, William Savage, upon your kind request, in my sleep clinic today for Initial consultation of his sleep disorder, in particular, evaluation of his prior diagnosis of sleep apnea.  The patient is accompanied by his wife today.  As you know, Mr. Hattabaugh is a 60 year old right-handed gentleman with an underlying medical history of hypertension, hyperlipidemia, angina, ED, and obesity, who was previously diagnosed with obstructive sleep apnea and placed on PAP therapy.  He has not been using his machine consistently. He brought his autoPAP machine and I was able to review his compliance: In the past 90 days from 12/10/2019 through 03/08/2020 he used his machine 16 days with  percent use days greater than 4 hours at 13% only, indicating suboptimal compliance, he recently restarted using his machine on 02/09/2020. Residual AHI is 0.3/h, average pressure for the 95th percentile at 8.6 cm with a maximum of 10 cm, range of 4 to 20 cm which is the default setting. 95th percentile of leak at 8.4 L/min. He uses a full facemask. He reports that he got this machine in 2018 and has not received any new supplies. Prior sleep study results are not available for my review today, [place] in [place].  I reviewed your office note from 01/02/2020.  His Epworth sleepiness score is 18 out of 24, fatigue severity score is 44 out of 63. He reports not waking up rested. He has sleep disruption. He reports that he has nightmares at times. He also reports that he has PTSD. Generally, he tries to be in bed around midnight and wake up time is around 8 AM. He has nocturia about once or twice per average night. He does not drink alcohol, is a non-smoker and drinks caffeine in limitation, about 1cup/day in the morning. He has had some weight gain in the past year, around 12 pounds.    REVIEW OF SYSTEMS: Out of a complete 14 system review of symptoms, the patient complains only of the following symptoms, anxiety, depression, PTSD, daytime sleepiness, and all other reviewed systems are  negative.  ESS:16 FSS:30   ALLERGIES: No Known Allergies  HOME MEDICATIONS: Outpatient Medications Prior to Visit  Medication Sig Dispense Refill  . amLODipine (NORVASC) 10 MG tablet TAKE 1 TABLET BY MOUTH EVERY DAY IN THE EVENING 90 tablet 1  . aspirin 81 MG chewable tablet Chew 81 mg by mouth daily.    . colchicine 0.6 MG tablet Take 0.6 mg by mouth daily. As needed for gout    . losartan-hydrochlorothiazide (HYZAAR) 100-12.5 MG tablet Take 1 tablet by mouth every morning. 90 tablet 3  . prazosin (MINIPRESS) 1 MG capsule Take 1 mg by mouth in the morning, at noon, in the evening, and at bedtime. 1  tab 4 times a day    . Psyllium (METAMUCIL) 48.57 % POWD Take by mouth.    . rosuvastatin (CRESTOR) 20 MG tablet Take 1 tablet (20 mg total) by mouth daily. 90 tablet 2  . sertraline (ZOLOFT) 100 MG tablet Take 100 mg by mouth daily.    . sildenafil (VIAGRA) 100 MG tablet Take 1 tablet (100 mg total) by mouth daily as needed for erectile dysfunction. 20 tablet 3  . nitroGLYCERIN (NITROSTAT) 0.4 MG SL tablet Place 1 tablet (0.4 mg total) under the tongue every 5 (five) minutes as needed for up to 25 days for chest pain. 25 tablet 3   No facility-administered medications prior to visit.    PAST MEDICAL HISTORY: Past Medical History:  Diagnosis Date  . Hyperlipidemia   . Hypertension     PAST SURGICAL HISTORY: History reviewed. No pertinent surgical history.  FAMILY HISTORY: Family History  Problem Relation Age of Onset  . Diabetes Mother   . Stroke Mother   . Hypertension Mother   . Unexplained death Father   . Hypertension Sister   . Diabetes Sister   . Hypertension Sister   . Diabetes Sister     SOCIAL HISTORY: Social History   Socioeconomic History  . Marital status: Married    Spouse name: Not on file  . Number of children: 2  . Years of education: Not on file  . Highest education level: Not on file  Occupational History  . Not on file  Tobacco Use  . Smoking status: Never Smoker  . Smokeless tobacco: Never Used  Vaping Use  . Vaping Use: Never used  Substance and Sexual Activity  . Alcohol use: Not Currently  . Drug use: Never  . Sexual activity: Not on file  Other Topics Concern  . Not on file  Social History Narrative  . Not on file   Social Determinants of Health   Financial Resource Strain: Not on file  Food Insecurity: Not on file  Transportation Needs: Not on file  Physical Activity: Not on file  Stress: Not on file  Social Connections: Not on file  Intimate Partner Violence: Not on file     PHYSICAL EXAM  Vitals:   06/23/20 1032  BP:  114/70  Pulse: (!) 53  Weight: 238 lb (108 kg)  Height: 6' (1.829 m)   Body mass index is 32.28 kg/m.  Generalized: Well developed, in no acute distress  Cardiology: normal rate and rhythm, no murmur noted Respiratory: clear to auscultation bilaterally  Neurological examination  Mentation: Alert oriented to time, place, history taking. Follows all commands speech and language fluent Cranial nerve II-XII: Pupils were equal round reactive to light. Extraocular movements were full, visual field were full  Motor: The motor testing reveals 5 over 5 strength of all  4 extremities. Good symmetric motor tone is noted throughout.  Gait and station: Gait is normal.    DIAGNOSTIC DATA (LABS, IMAGING, TESTING) - I reviewed patient records, labs, notes, testing and imaging myself where available.  No flowsheet data found.   No results found for: WBC, HGB, HCT, MCV, PLT    Component Value Date/Time   NA 142 11/29/2019 1147   K 4.3 11/29/2019 1147   CL 103 11/29/2019 1147   CO2 26 11/29/2019 1147   GLUCOSE 84 11/29/2019 1147   BUN 12 11/29/2019 1147   CREATININE 1.11 11/29/2019 1147   CALCIUM 10.0 11/29/2019 1147   GFRNONAA 74 11/29/2019 1147   GFRAA 85 11/29/2019 1147   No results found for: CHOL, HDL, LDLCALC, LDLDIRECT, TRIG, CHOLHDL No results found for: UXLK4M No results found for: VITAMINB12 No results found for: TSH   ASSESSMENT AND PLAN 60 y.o. year old male  has a past medical history of Hyperlipidemia and Hypertension. here with     ICD-10-CM   1. OSA on CPAP  G47.33 For home use only DME continuous positive airway pressure (CPAP)   Z99.89 For home use only DME continuous positive airway pressure (CPAP)    For home use only DME continuous positive airway pressure (CPAP)     Trevaun Rendleman continues to struggle with CPAP compliance. Compliance report reveals he has used CPAP very little over the past 30 days. Pressure setting were not changed as ordered in 02/2020. He no  longer feels that he is having difficulty with pressure settings, however, I remain a little concerned as he has not used CPAP consistently. I will resubmit orders for transfer of care to [facility]. We will also send supply, pressure change and mask refit orders today. He was advised to reach out to me if he has not heard back in 2 weeks.  He was encouraged to continue using CPAP nightly and for greater than 4 hours each night. Risks of untreated sleep apnea review and education materials provided. Healthy lifestyle habits encouraged. He will follow up in 3 months, sooner if needed. He verbalizes understanding and agreement with this plan.   Orders Placed This Encounter  Procedures  . For home use only DME continuous positive airway pressure (CPAP)    Supplies    Order Specific Question:   Length of Need    Answer:   Lifetime    Order Specific Question:   Patient has OSA or probable OSA    Answer:   Yes    Order Specific Question:   Is the patient currently using CPAP in the home    Answer:   Yes    Order Specific Question:   Settings    Answer:   Other see comments    Order Specific Question:   CPAP supplies needed    Answer:   Mask, headgear, cushions, filters, heated tubing and water chamber  . For home use only DME continuous positive airway pressure (CPAP)    Patient needs mask refit, has anxiety and PTSD, feels claustrophobic in current full face mask    Order Specific Question:   Length of Need    Answer:   Lifetime    Order Specific Question:   Patient has OSA or probable OSA    Answer:   Yes    Order Specific Question:   Is the patient currently using CPAP in the home    Answer:   Yes    Order Specific Question:   Settings  Answer:   Other see comments    Order Specific Question:   CPAP supplies needed    Answer:   Mask, headgear, cushions, filters, heated tubing and water chamber  . For home use only DME continuous positive airway pressure (CPAP)    Please adjust pressure  setting to min pressure 4cmH20 and max pressure 12cmH20, EPR 2    Order Specific Question:   Length of Need    Answer:   Lifetime    Order Specific Question:   Patient has OSA or probable OSA    Answer:   Yes    Order Specific Question:   Is the patient currently using CPAP in the home    Answer:   Yes    Order Specific Question:   Settings    Answer:   Other see comments    Order Specific Question:   CPAP supplies needed    Answer:   Mask, headgear, cushions, filters, heated tubing and water chamber     No orders of the defined types were placed in this encounter.     I spent 25 minutes with the patient. 50% of this time was spent counseling and educating patient on plan of care and medications.    Shawnie Dapper, FNP-C 06/23/2020, 12:26 PM [facility] [address], [address] [place], [place] [address] [phone]  I reviewed the above note and documentation by the Nurse Practitioner and agree with the history, exam, assessment and plan as outlined above. I was available for consultation. Huston Foley, MD, PhD [facility])

## 2020-06-23 NOTE — Patient Instructions (Addendum)
Please continue using your CPAP regularly. While your insurance requires that you use CPAP at least 4 hours each night on 70% of the nights, I recommend, that you not skip any nights and use it throughout the night if you can. Getting used to CPAP and staying with the treatment long term does take time and patience and discipline. Untreated obstructive sleep apnea when it is moderate to severe can have an adverse impact on cardiovascular health and raise her risk for heart disease, arrhythmias, hypertension, congestive heart failure, stroke and diabetes. Untreated obstructive sleep apnea causes sleep disruption, nonrestorative sleep, and sleep deprivation. This can have an impact on your day to day functioning and cause daytime sleepiness and impairment of cognitive function, memory loss, mood disturbance, and problems focussing. Using CPAP regularly can improve these symptoms.   I will send mask refitting orders and orders to adjust settings to [facility].  Please let me know if you do not hear back from [facility] in 1-2 weeks. Try to use CPAP every night. Build time each week. Goal is to use CPAP nightly and more than 4 hours each night.   I will have you return for follow up in 3 months   Sleep Apnea Sleep apnea affects breathing during sleep. It causes breathing to stop for a short time or to become shallow. It can also increase the risk of:  Heart attack.  Stroke.  Being very overweight (obese).  Diabetes.  Heart failure.  Irregular heartbeat. The goal of treatment is to help you breathe normally again. What are the causes? There are three kinds of sleep apnea:  Obstructive sleep apnea. This is caused by a blocked or collapsed airway.  Central sleep apnea. This happens when the brain does not send the right signals to the muscles that control breathing.  Mixed sleep apnea. This is a combination of obstructive and central sleep apnea. The most common cause of this condition is a  collapsed or blocked airway. This can happen if:  Your throat muscles are too relaxed.  Your tongue and tonsils are too large.  You are overweight.  Your airway is too small. What increases the risk?  Being overweight.  Smoking.  Having a small airway.  Being older.  Being male.  Drinking alcohol.  Taking medicines to calm yourself (sedatives or tranquilizers).  Having family members with the condition. What are the signs or symptoms?  Trouble staying asleep.  Being sleepy or tired during the day.  Getting angry a lot.  Loud snoring.  Headaches in the morning.  Not being able to focus your mind (concentrate).  Forgetting things.  Less interest in sex.  Mood swings.  Personality changes.  Feelings of sadness (depression).  Waking up a lot during the night to pee (urinate).  Dry mouth.  Sore throat. How is this diagnosed?  Your medical history.  A physical exam.  A test that is done when you are sleeping (sleep study). The test is most often done in a sleep lab but may also be done at home. How is this treated?   Sleeping on your side.  Using a medicine to get rid of mucus in your nose (decongestant).  Avoiding the use of alcohol, medicines to help you relax, or certain pain medicines (narcotics).  Losing weight, if needed.  Changing your diet.  Not smoking.  Using a machine to open your airway while you sleep, such as: ? An oral appliance. This is a mouthpiece that shifts your lower  jaw forward. ? A CPAP device. This device blows air through a mask when you breathe out (exhale). ? An EPAP device. This has valves that you put in each nostril. ? A BPAP device. This device blows air through a mask when you breathe in (inhale) and breathe out.  Having surgery if other treatments do not work. It is important to get treatment for sleep apnea. Without treatment, it can lead to:  High blood pressure.  Coronary artery disease.  In men,  not being able to have an erection (impotence).  Reduced thinking ability. Follow these instructions at home: Lifestyle  Make changes that your doctor recommends.  Eat a healthy diet.  Lose weight if needed.  Avoid alcohol, medicines to help you relax, and some pain medicines.  Do not use any products that contain nicotine or tobacco, such as cigarettes, e-cigarettes, and chewing tobacco. If you need help quitting, ask your doctor. General instructions  Take over-the-counter and prescription medicines only as told by your doctor.  If you were given a machine to use while you sleep, use it only as told by your doctor.  If you are having surgery, make sure to tell your doctor you have sleep apnea. You may need to bring your device with you.  Keep all follow-up visits as told by your doctor. This is important. Contact a doctor if:  The machine that you were given to use during sleep bothers you or does not seem to be working.  You do not get better.  You get worse. Get help right away if:  Your chest hurts.  You have trouble breathing in enough air.  You have an uncomfortable feeling in your back, arms, or stomach.  You have trouble talking.  One side of your body feels weak.  A part of your face is hanging down. These symptoms may be an emergency. Do not wait to see if the symptoms will go away. Get medical help right away. Call your local emergency services (911 in the [place] Do not drive yourself to the hospital. Summary  This condition affects breathing during sleep.  The most common cause is a collapsed or blocked airway.  The goal of treatment is to help you breathe normally while you sleep. This information is not intended to replace advice given to you by your health care provider. Make sure you discuss any questions you have with your health care provider. Document Revised: 03/23/2018 Document Reviewed: 01/30/2018 Elsevier Patient Education  2020 [employer].

## 2020-06-24 NOTE — Progress Notes (Addendum)
CM sent to [facility] for DME cpap needs.Kathee Delton, RN got it, thank you!

## 2020-07-08 ENCOUNTER — Telehealth: Payer: Self-pay | Admitting: *Deleted

## 2020-07-09 NOTE — Telephone Encounter (Signed)
Pt has gotten from [facility].  Fax confirmation received [phone] most recent DME order.

## 2020-07-09 NOTE — Telephone Encounter (Signed)
Brown, Jessica  Carli Lefevers S, RN Thank you!   

## 2020-09-22 NOTE — Patient Instructions (Addendum)
Please continue using your CPAP regularly. While your insurance requires that you use CPAP at least 4 hours each night on 70% of the nights, I recommend, that you not skip any nights and use it throughout the night if you can. Getting used to CPAP and staying with the treatment long term does take time and patience and discipline. Untreated obstructive sleep apnea when it is moderate to severe can have an adverse impact on cardiovascular health and raise her risk for heart disease, arrhythmias, hypertension, congestive heart failure, stroke and diabetes. Untreated obstructive sleep apnea causes sleep disruption, nonrestorative sleep, and sleep deprivation. This can have an impact on your day to day functioning and cause daytime sleepiness and impairment of cognitive function, memory loss, mood disturbance, and problems focussing. Using CPAP regularly can improve these symptoms.   Follow up in 1 year   Sleep Apnea Sleep apnea affects breathing during sleep. It causes breathing to stop for a short time or to become shallow. It can also increase the risk of:  Heart attack.  Stroke.  Being very overweight (obese).  Diabetes.  Heart failure.  Irregular heartbeat. The goal of treatment is to help you breathe normally again. What are the causes? There are three kinds of sleep apnea:  Obstructive sleep apnea. This is caused by a blocked or collapsed airway.  Central sleep apnea. This happens when the brain does not send the right signals to the muscles that control breathing.  Mixed sleep apnea. This is a combination of obstructive and central sleep apnea. The most common cause of this condition is a collapsed or blocked airway. This can happen if:  Your throat muscles are too relaxed.  Your tongue and tonsils are too large.  You are overweight.  Your airway is too small.   What increases the risk?  Being overweight.  Smoking.  Having a small airway.  Being older.  Being  male.  Drinking alcohol.  Taking medicines to calm yourself (sedatives or tranquilizers).  Having family members with the condition. What are the signs or symptoms?  Trouble staying asleep.  Being sleepy or tired during the day.  Getting angry a lot.  Loud snoring.  Headaches in the morning.  Not being able to focus your mind (concentrate).  Forgetting things.  Less interest in sex.  Mood swings.  Personality changes.  Feelings of sadness (depression).  Waking up a lot during the night to pee (urinate).  Dry mouth.  Sore throat. How is this diagnosed?  Your medical history.  A physical exam.  A test that is done when you are sleeping (sleep study). The test is most often done in a sleep lab but may also be done at home. How is this treated?  Sleeping on your side.  Using a medicine to get rid of mucus in your nose (decongestant).  Avoiding the use of alcohol, medicines to help you relax, or certain pain medicines (narcotics).  Losing weight, if needed.  Changing your diet.  Not smoking.  Using a machine to open your airway while you sleep, such as: ? An oral appliance. This is a mouthpiece that shifts your lower jaw forward. ? A CPAP device. This device blows air through a mask when you breathe out (exhale). ? An EPAP device. This has valves that you put in each nostril. ? A BPAP device. This device blows air through a mask when you breathe in (inhale) and breathe out.  Having surgery if other treatments do not work. It   is important to get treatment for sleep apnea. Without treatment, it can lead to:  High blood pressure.  Coronary artery disease.  In men, not being able to have an erection (impotence).  Reduced thinking ability.   Follow these instructions at home: Lifestyle  Make changes that your doctor recommends.  Eat a healthy diet.  Lose weight if needed.  Avoid alcohol, medicines to help you relax, and some pain  medicines.  Do not use any products that contain nicotine or tobacco, such as cigarettes, e-cigarettes, and chewing tobacco. If you need help quitting, ask your doctor. General instructions  Take over-the-counter and prescription medicines only as told by your doctor.  If you were given a machine to use while you sleep, use it only as told by your doctor.  If you are having surgery, make sure to tell your doctor you have sleep apnea. You may need to bring your device with you.  Keep all follow-up visits as told by your doctor. This is important. Contact a doctor if:  The machine that you were given to use during sleep bothers you or does not seem to be working.  You do not get better.  You get worse. Get help right away if:  Your chest hurts.  You have trouble breathing in enough air.  You have an uncomfortable feeling in your back, arms, or stomach.  You have trouble talking.  One side of your body feels weak.  A part of your face is hanging down. These symptoms may be an emergency. Do not wait to see if the symptoms will go away. Get medical help right away. Call your local emergency services (911 in the [place] Do not drive yourself to the hospital. Summary  This condition affects breathing during sleep.  The most common cause is a collapsed or blocked airway.  The goal of treatment is to help you breathe normally while you sleep. This information is not intended to replace advice given to you by your health care provider. Make sure you discuss any questions you have with your health care provider. Document Revised: 03/23/2018 Document Reviewed: 01/30/2018 Elsevier Patient Education  2021 Elsevier Inc.  

## 2020-09-22 NOTE — Progress Notes (Addendum)
PATIENT: William Savage DOB: 07/04/1960  REASON FOR VISIT: follow up HISTORY FROM: patient  Chief Complaint  Patient presents with  . Obstructive Sleep Apnea    RM 1 alone Pt is well, CPAP helps him sleep all night for the most part      HISTORY OF PRESENT ILLNESS: 09/23/20 ALL: He returns for follow up for OSA on CPAP. He is doing well on therapy. He is using CPAP nightly. He is sleeping better. He feels rested in the mornings. He is having less nightmares. He has been working with his PCP to control BP. He is doing well, today, and without concerns.       06/23/2020 ALL:  William Savage is a 60 y.o. male here today for follow up for OSA on CPAP. He was seen by Dr William Savage in 02/2020 when he had restarted CPAP therapy. Pressure settings were adjusted as he felt they were too strong. He reports that he has tried to use CPAP "on and off" since last being seen. He feels that pressure settings are fine. He is using a full face mask. He reports difficulty with anxiety, depression and [facility]. He feels claustrophobic at times in full face mask. He continues to feel tired and has significant daytime sleepiness.   Compliance report dated 05/23/2020 through 06/22/2019 reveals that he used CPAP 3 of the past 30 days for compliance of 10%.  He used CPAP greater than 4 hours to of the past 30 days for compliance of 7%.  Average usage on days used was 4 hours and 44 minutes.  Residual AHI was 0.1 on 4 to 20 cm of water and an EPR of 2.  Pressure settings in the 95th percentile of 7.9.  There was no significant leak.   HISTORY: (copied from previous note)  Dear William Savage,   I saw your patient, William Savage, upon your kind request, in my sleep clinic today for Initial consultation of his sleep disorder, in particular, evaluation of his prior diagnosis of sleep apnea.  The patient is accompanied by his wife today.  As you know, William Savage is a 60 year old right-handed gentleman with an underlying  medical history of hypertension, hyperlipidemia, angina, ED, and obesity, who was previously diagnosed with obstructive sleep apnea and placed on PAP therapy.  He has not been using his machine consistently. He brought his autoPAP machine and I was able to review his compliance: In the past 90 days from 12/10/2019 through 03/08/2020 he used his machine 16 days with percent use days greater than 4 hours at 13% only, indicating suboptimal compliance, he recently restarted using his machine on 02/09/2020. Residual AHI is 0.3/h, average pressure for the 95th percentile at 8.6 cm with a maximum of 10 cm, range of 4 to 20 cm which is the default setting. 95th percentile of leak at 8.4 L/min. He uses a full facemask. He reports that he got this machine in 2018 and has not received any new supplies. Prior sleep study results are not available for my review today, [place] in [place].  I reviewed your office note from 01/02/2020.  His Epworth sleepiness score is 18 out of 24, fatigue severity score is 44 out of 63. He reports not waking up rested. He has sleep disruption. He reports that he has nightmares at times. He also reports that he has PTSD. Generally, he tries to be in bed around midnight and wake up time is around 8 AM. He has nocturia  about once or twice per average night. He does not drink alcohol, is a non-smoker and drinks caffeine in limitation, about 1cup/day in the morning. He has had some weight gain in the past year, around 12 pounds.    REVIEW OF SYSTEMS: Out of a complete 14 system review of symptoms, the patient complains only of the following symptoms, anxiety, depression, PTSD, daytime sleepiness, and all other reviewed systems are negative.  ESS:11 FSS:26   ALLERGIES: No Known Allergies  HOME MEDICATIONS: Outpatient Medications Prior to Visit  Medication Sig Dispense Refill  . amLODipine (NORVASC) 10 MG tablet TAKE 1 TABLET BY MOUTH EVERY DAY IN THE EVENING 90 tablet 1  .  aspirin 81 MG chewable tablet Chew 81 mg by mouth daily.    . colchicine 0.6 MG tablet Take 0.6 mg by mouth daily. As needed for gout    . losartan-hydrochlorothiazide (HYZAAR) 100-12.5 MG tablet Take 1 tablet by mouth every morning. 90 tablet 3  . nitroGLYCERIN (NITROSTAT) 0.4 MG SL tablet Place 1 tablet (0.4 mg total) under the tongue every 5 (five) minutes as needed for up to 25 days for chest pain. 25 tablet 3  . prazosin (MINIPRESS) 1 MG capsule Take 1 mg by mouth in the morning, at noon, in the evening, and at bedtime. 1 tab 4 times a day    . Psyllium (METAMUCIL) 48.57 % POWD Take by mouth.    . rosuvastatin (CRESTOR) 20 MG tablet Take 1 tablet (20 mg total) by mouth daily. 90 tablet 2  . sertraline (ZOLOFT) 100 MG tablet Take 100 mg by mouth daily.    . sildenafil (VIAGRA) 100 MG tablet Take 1 tablet (100 mg total) by mouth daily as needed for erectile dysfunction. 20 tablet 3   No facility-administered medications prior to visit.    PAST MEDICAL HISTORY: Past Medical History:  Diagnosis Date  . Hyperlipidemia   . Hypertension     PAST SURGICAL HISTORY: History reviewed. No pertinent surgical history.  FAMILY HISTORY: Family History  Problem Relation Age of Onset  . Diabetes Mother   . Stroke Mother   . Hypertension Mother   . Unexplained death Father   . Hypertension Sister   . Diabetes Sister   . Hypertension Sister   . Diabetes Sister     SOCIAL HISTORY: Social History   Socioeconomic History  . Marital status: Married    Spouse name: Not on file  . Number of children: 2  . Years of education: Not on file  . Highest education level: Not on file  Occupational History  . Not on file  Tobacco Use  . Smoking status: Never Smoker  . Smokeless tobacco: Never Used  Vaping Use  . Vaping Use: Never used  Substance and Sexual Activity  . Alcohol use: Not Currently  . Drug use: Never  . Sexual activity: Not on file  Other Topics Concern  . Not on file  Social  History Narrative  . Not on file   Social Determinants of Health   Financial Resource Strain: Not on file  Food Insecurity: Not on file  Transportation Needs: Not on file  Physical Activity: Not on file  Stress: Not on file  Social Connections: Not on file  Intimate Partner Violence: Not on file     PHYSICAL EXAM  Vitals:   09/23/20 0918  BP: 138/86  Pulse: 64  Weight: 240 lb (108.9 kg)  Height: 6' (1.829 m)   Body mass index is 32.55 kg/m.  Generalized: Well developed, in no acute distress  Cardiology: normal rate and rhythm, no murmur noted Respiratory: clear to auscultation bilaterally  Neurological examination  Mentation: Alert oriented to time, place, history taking. Follows all commands speech and language fluent Cranial nerve II-XII: Pupils were equal round reactive to light. Extraocular movements were full, visual field were full  Motor: The motor testing reveals 5 over 5 strength of all 4 extremities. Good symmetric motor tone is noted throughout.  Gait and station: Gait is normal.    DIAGNOSTIC DATA (LABS, IMAGING, TESTING) - I reviewed patient records, labs, notes, testing and imaging myself where available.  No flowsheet data found.   No results found for: WBC, HGB, HCT, MCV, PLT    Component Value Date/Time   NA 142 11/29/2019 1147   K 4.3 11/29/2019 1147   CL 103 11/29/2019 1147   CO2 26 11/29/2019 1147   GLUCOSE 84 11/29/2019 1147   BUN 12 11/29/2019 1147   CREATININE 1.11 11/29/2019 1147   CALCIUM 10.0 11/29/2019 1147   GFRNONAA 74 11/29/2019 1147   GFRAA 85 11/29/2019 1147   No results found for: CHOL, HDL, LDLCALC, LDLDIRECT, TRIG, CHOLHDL No results found for: TKWI0X No results found for: VITAMINB12 No results found for: TSH   ASSESSMENT AND PLAN 60 y.o. year old male  has a past medical history of Hyperlipidemia and Hypertension. here with     ICD-10-CM   1. OSA on CPAP  G47.33 For home use only DME continuous positive airway  pressure (CPAP)   Z99.89      Kalvin Buss is doing well on CPAP therapy. Compliance report reveals excellent compliance with daily and 4 hour use. ESS and FSS are improved. He was encouraged to continue using CPAP nightly and for greater than 4 hours each night. Risks of untreated sleep apnea review and education materials provided. Healthy lifestyle habits encouraged. He will follow up in 1 year, sooner if needed. He verbalizes understanding and agreement with this plan.    Orders Placed This Encounter  Procedures  . For home use only DME continuous positive airway pressure (CPAP)    Supplies    Order Specific Question:   Length of Need    Answer:   Lifetime    Order Specific Question:   Patient has OSA or probable OSA    Answer:   Yes    Order Specific Question:   Is the patient currently using CPAP in the home    Answer:   Yes    Order Specific Question:   Settings    Answer:   Other see comments    Order Specific Question:   CPAP supplies needed    Answer:   Mask, headgear, cushions, filters, heated tubing and water chamber     No orders of the defined types were placed in this encounter.     I spent 25 minutes with the patient. 50% of this time was spent counseling and educating patient on plan of care and medications.    Shawnie Dapper, FNP-C 09/23/2020, 9:57 AM [facility] [address], [address] [place], [place] [address] [phone]  I reviewed the above note and documentation by the Nurse Practitioner and agree with the history, exam, assessment and plan as outlined above. I was available for consultation. Huston Foley, MD, PhD [facility])

## 2020-09-23 ENCOUNTER — Ambulatory Visit: Payer: Federal, State, Local not specified - PPO | Admitting: Family Medicine

## 2020-09-23 ENCOUNTER — Encounter: Payer: Self-pay | Admitting: Family Medicine

## 2020-09-23 ENCOUNTER — Other Ambulatory Visit: Payer: Self-pay

## 2020-09-23 VITALS — BP 138/86 | HR 64 | Ht 72.0 in | Wt 240.0 lb

## 2020-09-23 DIAGNOSIS — Z9989 Dependence on other enabling machines and devices: Secondary | ICD-10-CM | POA: Diagnosis not present

## 2020-09-23 DIAGNOSIS — G4733 Obstructive sleep apnea (adult) (pediatric): Secondary | ICD-10-CM

## 2020-10-07 ENCOUNTER — Ambulatory Visit: Payer: Federal, State, Local not specified - PPO | Admitting: Cardiology

## 2020-11-12 ENCOUNTER — Ambulatory Visit: Payer: Federal, State, Local not specified - PPO | Admitting: Cardiology

## 2020-11-12 ENCOUNTER — Other Ambulatory Visit: Payer: Self-pay

## 2020-11-12 ENCOUNTER — Encounter: Payer: Self-pay | Admitting: Cardiology

## 2020-11-12 VITALS — BP 140/85 | HR 61 | Temp 97.9°F | Resp 17 | Ht 72.0 in | Wt 228.0 lb

## 2020-11-12 DIAGNOSIS — I10 Essential (primary) hypertension: Secondary | ICD-10-CM

## 2020-11-12 DIAGNOSIS — E7800 Pure hypercholesterolemia, unspecified: Secondary | ICD-10-CM

## 2020-11-12 DIAGNOSIS — G4733 Obstructive sleep apnea (adult) (pediatric): Secondary | ICD-10-CM

## 2020-11-12 MED ORDER — ROSUVASTATIN CALCIUM 10 MG PO TABS: 10.0000 mg | ORAL_TABLET | Freq: Every day | ORAL | Status: DC

## 2020-11-12 MED ORDER — LOSARTAN POTASSIUM-HCTZ 100-25 MG PO TABS: 1.0000 | ORAL_TABLET | ORAL | 3 refills | Status: DC

## 2020-11-12 NOTE — Progress Notes (Signed)
Primary Physician/Referring:  Reynold Bowen, MD  Patient ID: William Savage, male    DOB: 1960/11/01, 60 y.o.   MRN: [id number]  No chief complaint on file.  HPI:    William Savage  is a 60 y.o. African-American ex [employer], hypertension, hyperlipidemia, symptoms suggestive of angina pectoris and had nuclear stress test and echocardiogram in June 2021 were low risk and normal respectively.  Symptoms have improved with medical management and blood pressure control.    Patient presents for 12-monthfollow-up.  He has no specific complaints today, he has not had any chest pain, dyspnea, leg edema.  States that he discontinued amlodipine as his blood pressure had been controlled.  Home blood pressure recordings have been around 1258-527systolic over 85 to 90 mmHg diastolic.  Is presently using his CPAP on a regular basis and has been very compliant.     Past Medical History:  Diagnosis Date  . Hyperlipidemia   . Hypertension    Past Surgical History:  Procedure Laterality Date  . COLONOSCOPY  2021   Family History  Problem Relation Age of Onset  . Diabetes Mother   . Stroke Mother   . Hypertension Mother   . Unexplained death Father   . Hypertension Sister   . Diabetes Sister   . Hypertension Sister   . Diabetes Sister     Social History   Tobacco Use  . Smoking status: Never Smoker  . Smokeless tobacco: Never Used  Substance Use Topics  . Alcohol use: Not Currently   Marital Status:    ROS  Review of Systems  Constitutional: Negative for malaise/fatigue.  Cardiovascular: Negative for chest pain, dyspnea on exertion, leg swelling, near-syncope, orthopnea, palpitations, paroxysmal nocturnal dyspnea and syncope.  Respiratory: Positive for snoring (has diagnosis of OSA , not compliant with CPAP).   Gastrointestinal: Negative for melena.  Neurological: Negative for dizziness.   Objective  Blood pressure 140/85, pulse 61, temperature 97.9 F (36.6 C), temperature source  Temporal, resp. rate 17, height 6' (1.829 m), weight 228 lb (103.4 kg), SpO2 98 %.   Vitals with BMI 11/12/2020 09/23/2020 06/23/2020  Height 6' 0"  6' 0"  6' 0"   Weight 228 lbs 240 lbs 238 lbs  BMI 30.92 378.24323.53 Systolic [id number] Diastolic 85 86 70  Pulse 61 64 53     Physical Exam Vitals reviewed.  Constitutional:      General: He is not in acute distress.    Appearance: He is well-developed.     Comments: muscular  HENT:     Head: Normocephalic and atraumatic.  Cardiovascular:     Rate and Rhythm: Normal rate and regular rhythm.     Pulses: Intact distal pulses.     Heart sounds: Normal heart sounds, S1 normal and S2 normal. No murmur heard. No gallop.      Comments: No leg edema, no JVD.  Pulmonary:     Effort: Pulmonary effort is normal. No accessory muscle usage or respiratory distress.     Breath sounds: Normal breath sounds. No wheezing, rhonchi or rales.  Abdominal:     General: Bowel sounds are normal.     Palpations: Abdomen is soft.  Musculoskeletal:     Right lower leg: No edema.     Left lower leg: No edema.  Neurological:     Mental Status: He is alert.    Laboratory examination:    External labs:   Lab 10/08/2020:  Total cholesterol 106, triglycerides 64,  HDL 44, LDL 49.  Hb 14.5/HCT 43.9, platelets 238.  Normal indicis.  BUN 12, creatinine 1.12, EGFR 77 mL, hepatic function normal. Labs 03/25/2020:  Total cholesterol 131, triglycerides eighty-six, HDL thirty-six, LDL seventy-eight, non-HDL cholesterol ninety-five.  Total protein 8.2, albumin 4.2, LFTs including AST and ALT normal.  Serum glucose 85 mg, BUN twelve, creatinine 1.2, EGFR >62 mL.  Sodium 140, potassium 4.2.   Lipid Panel completed 10/30/2019 Cholesterol, total 235.000 m 10/30/2019 Triglycerides 238.000 10/30/2019 HDL 39 MG/DL 10/30/2019 LDL 148.000 m 10/30/2019  Glucose Random 85.000 mg 10/30/2019 BUN 12.000 mg 10/30/2019 Creatinine, Serum 1.200 mg/ 10/30/2019  10/30/2019: EGFR  63/76, Na 140.  Medications and allergies  No Known Allergies   Current Outpatient Medications  Medication Instructions  . aspirin 81 mg, Oral, Daily  . colchicine 0.6 mg, Oral, Daily, As needed for gout   . losartan-hydrochlorothiazide (HYZAAR) 100-25 MG tablet 1 tablet, Oral, BH-each morning  . nitroGLYCERIN (NITROSTAT) 0.4 mg, Sublingual, Every 5 min PRN  . prazosin (MINIPRESS) 1 mg, Oral, 4 times daily, 1 tab 4 times a day  . Psyllium (METAMUCIL) 48.57 % POWD Oral  . rosuvastatin (CRESTOR) 10 mg, Oral, Daily  . sertraline (ZOLOFT) 100 mg, Oral, Daily  . sildenafil (VIAGRA) 100 mg, Oral, Daily PRN   Radiology:   No results found.  Cardiac Studies:   Echocardiogram 11/28/2019:  Normal LV systolic function with visual EF 60-65%. Left ventricle cavity  is normal in size. Mild to moderate hypertrophy of the left ventricle.  Normal global wall motion. Normal diastolic filling pattern, normal LAP.  Calculated EF 59%.  Mild pulmonic regurgitation.  No prior study for comparison.  Exercise Sestamibi stress test 12/18/2019: Exercise nuclear stress test was performed using Bruce protocol. Patient reached 13.2 METS, and 88% of age predicted maximum heart rate. Exercise capacity was excellent. Chest pain not reported. Heart rate and hemodynamic response were normal. Stress EKG revealed no ischemic changes. Normal myocardial perfusion. Stress LVEF 53%.  EKG  EKG 11/12/2020: Normal sinus rhythm heart rate of 68 bpm, normal axis.  No evidence of ischemia, normal EKG.   No significant change from EKG 11/22/2019.  Assessment     ICD-10-CM   1. Primary hypertension  I10 EKG 12-Lead    losartan-hydrochlorothiazide (HYZAAR) 100-25 MG tablet  2. Hypercholesteremia  E78.00 rosuvastatin (CRESTOR) 10 MG tablet  3. OSA (obstructive sleep apnea)  G47.33     Meds ordered this encounter  Medications  . losartan-hydrochlorothiazide (HYZAAR) 100-25 MG tablet    Sig: Take 1 tablet by mouth every  morning.    Dispense:  90 tablet    Refill:  3  . rosuvastatin (CRESTOR) 10 MG tablet    Sig: Take 1 tablet (10 mg total) by mouth daily.    Medications Discontinued During This Encounter  Medication Reason  . amLODipine (NORVASC) 10 MG tablet Error  . losartan-hydrochlorothiazide (HYZAAR) 100-12.5 MG tablet Dose change  . rosuvastatin (CRESTOR) 20 MG tablet     Recommendations:   William Savage  is a 60 y.o. African-American ex [employer], hypertension, hyperlipidemia, symptoms suggestive of angina pectoris and had nuclear stress test and echocardiogram in June 2021 were low risk and normal respectively.  Symptoms have improved with medical management and blood pressure control.    Patient presents for 61-monthfollow-up. Patient presents for follow-up, presently doing well without recurrence of angina.    His blood pressure is elevated today and also at home.  I have increased losartan HCT from 100/12.5 mg  200/25 mg in the morning, I discussed potential exacerbation of gout and if so, we can go back to adding amlodipine and reducing the hydrochlorothiazide back to 12.5 mg dose.  With regard to hyperlipidemia, lipids are very well under control.  As he has no known coronary disease and his vascular examination is normal and he has had a low risk nuclear stress test, we can reduce his Crestor back to 10 mg daily.  Weight loss discussed with the patient, he needs to get his BMI at least around 27-28, but the ideal body weight around maximum of 200 pounds.  Patient will make an try lifestyle changes.  Otherwise stable from cardiac standpoint, I will see him back on a as needed basis.      Adrian Prows, MD, [facility] 11/12/2020, 12:35 PM Office: [phone] Fax: [phone] Pager: [phone]

## 2020-11-12 NOTE — Patient Instructions (Signed)
Reduce the weight by 200 pounds.

## 2020-11-18 ENCOUNTER — Other Ambulatory Visit: Payer: Self-pay | Admitting: Cardiology

## 2020-11-18 DIAGNOSIS — E7800 Pure hypercholesterolemia, unspecified: Secondary | ICD-10-CM

## 2020-11-19 ENCOUNTER — Other Ambulatory Visit: Payer: Self-pay | Admitting: Cardiology

## 2020-11-19 DIAGNOSIS — E7800 Pure hypercholesterolemia, unspecified: Secondary | ICD-10-CM

## 2020-12-24 ENCOUNTER — Other Ambulatory Visit: Payer: Self-pay | Admitting: Cardiology

## 2020-12-24 DIAGNOSIS — E7800 Pure hypercholesterolemia, unspecified: Secondary | ICD-10-CM

## 2021-05-19 ENCOUNTER — Other Ambulatory Visit (HOSPITAL_COMMUNITY): Payer: Self-pay | Admitting: Nephrology

## 2021-05-19 ENCOUNTER — Other Ambulatory Visit: Payer: Self-pay | Admitting: Nephrology

## 2021-05-19 DIAGNOSIS — R972 Elevated prostate specific antigen [PSA]: Secondary | ICD-10-CM

## 2021-06-01 ENCOUNTER — Ambulatory Visit (HOSPITAL_COMMUNITY): Payer: Federal, State, Local not specified - PPO

## 2021-06-01 ENCOUNTER — Encounter (HOSPITAL_COMMUNITY): Payer: Self-pay

## 2021-06-09 ENCOUNTER — Ambulatory Visit (HOSPITAL_COMMUNITY)
Admission: RE | Admit: 2021-06-09 | Discharge: 2021-06-09 | Disposition: A | Discharge status: Home / Self Care | Payer: No Typology Code available for payment source | Source: Ambulatory Visit | Attending: Nephrology | Admitting: Nephrology

## 2021-06-09 ENCOUNTER — Other Ambulatory Visit: Payer: Self-pay

## 2021-06-09 DIAGNOSIS — R972 Elevated prostate specific antigen [PSA]: Secondary | ICD-10-CM | POA: Diagnosis not present

## 2021-06-09 MED ORDER — GADOBUTROL 1 MMOL/ML IV SOLN
10.0000 mL | Freq: Once | INTRAVENOUS | Status: AC | PRN
  Administered 2021-06-09: 08:00:00 10 mL via INTRAVENOUS

## 2021-08-05 DIAGNOSIS — R9431 Abnormal electrocardiogram [ECG] [EKG]: Secondary | ICD-10-CM | POA: Diagnosis not present

## 2021-09-01 DIAGNOSIS — S8391XA Sprain of unspecified site of right knee, initial encounter: Secondary | ICD-10-CM | POA: Diagnosis not present

## 2021-09-07 DIAGNOSIS — R3912 Poor urinary stream: Secondary | ICD-10-CM | POA: Diagnosis not present

## 2021-09-07 DIAGNOSIS — N401 Enlarged prostate with lower urinary tract symptoms: Secondary | ICD-10-CM | POA: Diagnosis not present

## 2021-09-07 DIAGNOSIS — R350 Frequency of micturition: Secondary | ICD-10-CM | POA: Diagnosis not present

## 2021-09-07 DIAGNOSIS — R3915 Urgency of urination: Secondary | ICD-10-CM | POA: Diagnosis not present

## 2021-09-07 DIAGNOSIS — R2689 Other abnormalities of gait and mobility: Secondary | ICD-10-CM | POA: Diagnosis not present

## 2021-09-15 DIAGNOSIS — R972 Elevated prostate specific antigen [PSA]: Secondary | ICD-10-CM | POA: Diagnosis not present

## 2021-09-30 DIAGNOSIS — M7651 Patellar tendinitis, right knee: Secondary | ICD-10-CM | POA: Diagnosis not present

## 2021-09-30 DIAGNOSIS — M25561 Pain in right knee: Secondary | ICD-10-CM | POA: Diagnosis not present

## 2021-09-30 DIAGNOSIS — M25761 Osteophyte, right knee: Secondary | ICD-10-CM | POA: Diagnosis not present

## 2021-10-25 DIAGNOSIS — M25561 Pain in right knee: Secondary | ICD-10-CM | POA: Diagnosis not present

## 2021-11-04 DIAGNOSIS — M2391 Unspecified internal derangement of right knee: Secondary | ICD-10-CM | POA: Diagnosis not present

## 2021-11-04 DIAGNOSIS — M7651 Patellar tendinitis, right knee: Secondary | ICD-10-CM | POA: Diagnosis not present

## 2021-11-08 DIAGNOSIS — G43909 Migraine, unspecified, not intractable, without status migrainosus: Secondary | ICD-10-CM | POA: Diagnosis not present

## 2021-11-09 DIAGNOSIS — N401 Enlarged prostate with lower urinary tract symptoms: Secondary | ICD-10-CM | POA: Diagnosis not present

## 2021-11-18 DIAGNOSIS — M2391 Unspecified internal derangement of right knee: Secondary | ICD-10-CM | POA: Diagnosis not present

## 2021-11-18 DIAGNOSIS — M7651 Patellar tendinitis, right knee: Secondary | ICD-10-CM | POA: Diagnosis not present

## 2021-11-19 DIAGNOSIS — R7302 Impaired glucose tolerance (oral): Secondary | ICD-10-CM | POA: Diagnosis not present

## 2021-12-02 DIAGNOSIS — M25562 Pain in left knee: Secondary | ICD-10-CM | POA: Diagnosis not present

## 2021-12-02 DIAGNOSIS — M25561 Pain in right knee: Secondary | ICD-10-CM | POA: Diagnosis not present

## 2021-12-16 DIAGNOSIS — M25561 Pain in right knee: Secondary | ICD-10-CM | POA: Diagnosis not present

## 2021-12-21 IMAGING — MR MR PROSTATE WO/W CM
13 series · 48 of 48 positions shown · IV contrast (10 GADAVIST)
Comparison: None.

CLINICAL DATA: Elevated PSA.

EXAM:
MR PROSTATE WITHOUT AND WITH CONTRAST
TECHNIQUE: Multiplanar multisequence MRI images were obtained of the pelvis
centered about the prostate. Pre and post contrast images were
obtained.
CONTRAST:  10mL GADAVIST GADOBUTROL 1 MMOL/ML IV SOLN

[Series 3: T1 · axial · 5.0mm · 1.19mm/px · 1 of 72 slices shown (1 of 2)]
[im 1/72]
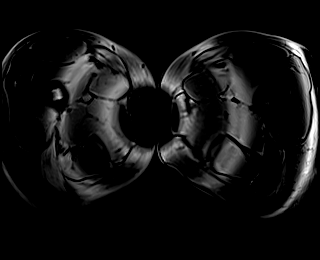

[Series 4: T1 · axial · 5.0mm · 1.19mm/px · z∈[-156,+199]mm · 2 of 72 slices shown (2 of 2)]
[im 1/72]
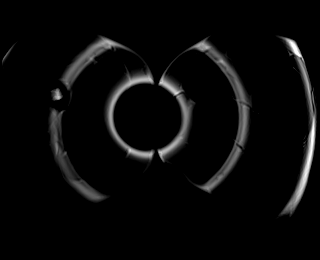
[im 72/72]
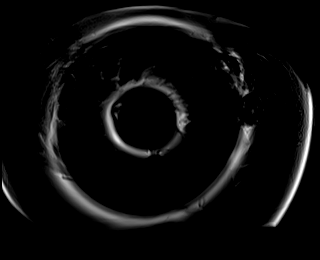

[Series 5: T2 · axial · 3.0mm · 0.47mm/px · 1 of 32 slices shown (1 of 3)]
[im 1/32]
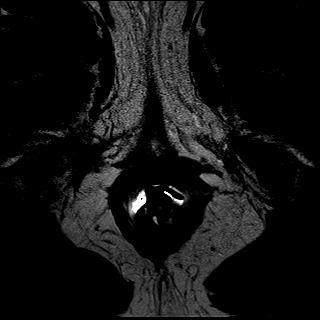

[Series 6: T2 · coronal · 3.0mm · 0.47mm/px · 1 of 27 slices shown (2 of 3)]
[im 1/27]
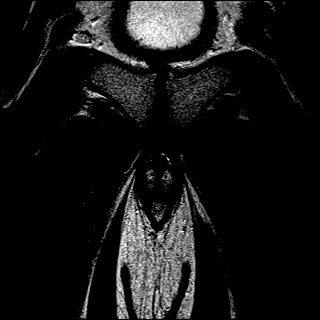

[Series 7: T2 · axial · 1.0mm · 1.00mm/px · z∈[-89,+6]mm · 2 of 96 slices shown (3 of 3)]
[im 1/96]
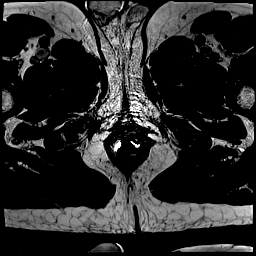
[im 96/96]
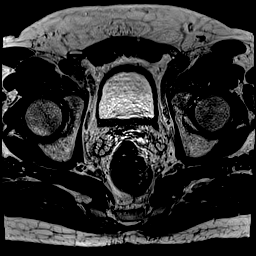

[Series 8: ep2d_diff_b100_500_800_tra_endo**_tracew_dfc_mix · axial · 3.0mm · 1.60mm/px · z∈[-88,+9]mm · 2 of 96 slices shown]
[im 1/96]
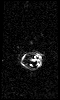
[im 96/96]
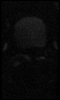

[Series 9: ep2d_diff_b100_500_800_tra_endo**_adc_dfc_mix · axial · 3.0mm · 1.60mm/px · 1 of 34 slices shown]
[im 1/34]
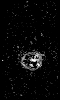

[Series 10: ep2d_diff_b100_500_800_tra_endo**_calc_bval_dfc_mix · axial · 3.0mm · 1.60mm/px · 1 of 34 slices shown]
[im 1/34]
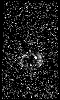

[Series 11: ep2d_diff_bvalue (id) · axial · 3.0mm · 1.60mm/px · 1 of 34 slices shown]
[im 1/34]
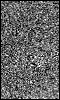

[Series 12: axial multiphase · axial · 3.0mm · 0.98mm/px · z∈[-82,+22]mm · 16 of 720 slices shown]
[im 1/720]
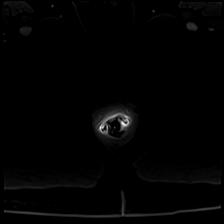
[im 48/720]
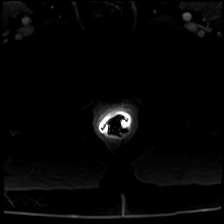
[im 96/720]
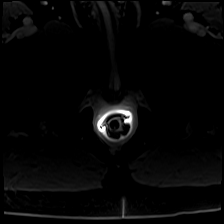
[im 144/720]
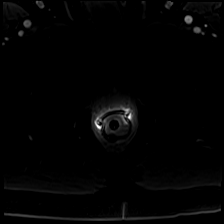
[im 192/720]
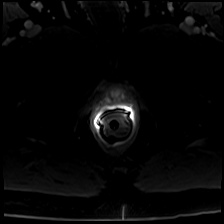
[im 240/720]
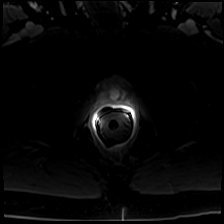
[im 288/720]
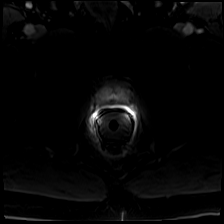
[im 336/720]
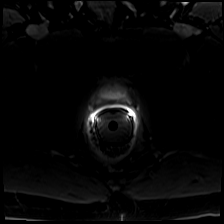
[im 384/720]
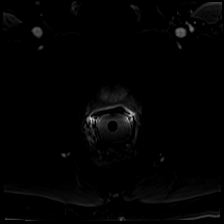
[im 432/720]
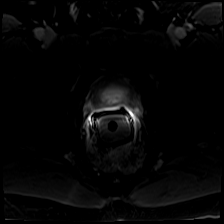
[im 480/720]
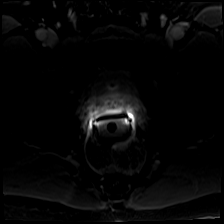
[im 528/720]
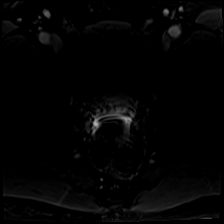
[im 576/720]
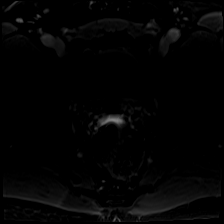
[im 624/720]
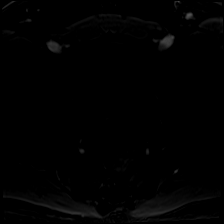
[im 672/720]
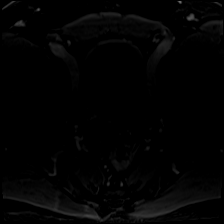
[im 720/720]
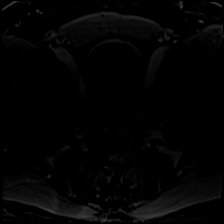

[Series 13: axial multiphase_sub · axial · 3.0mm · 0.98mm/px · z∈[-82,+22]mm · 16 of 684 slices shown]
[im 1/684]
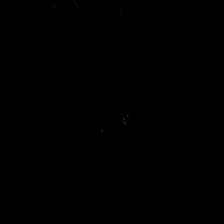
[im 46/684]
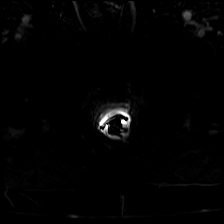
[im 92/684]
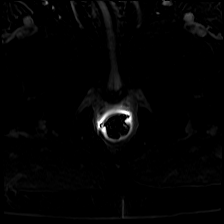
[im 137/684]
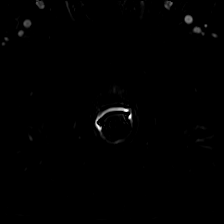
[im 183/684]
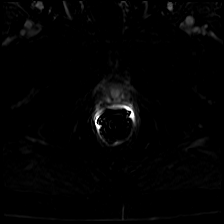
[im 228/684]
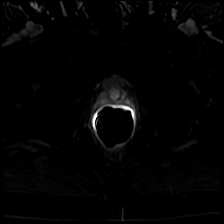
[im 274/684]
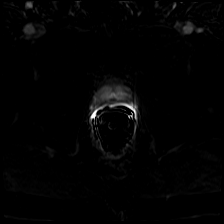
[im 319/684]
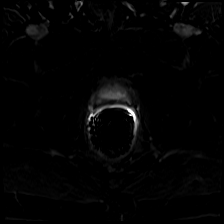
[im 365/684]
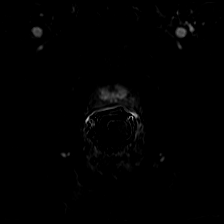
[im 410/684]
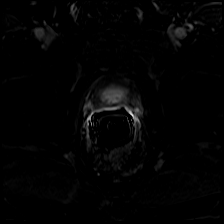
[im 456/684]
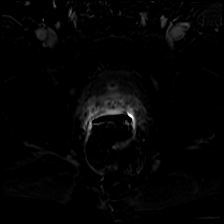
[im 501/684]
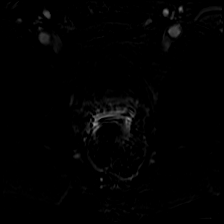
[im 547/684]
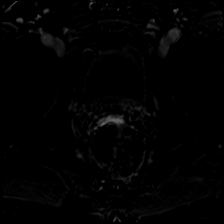
[im 592/684]
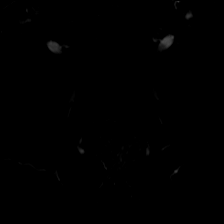
[im 638/684]
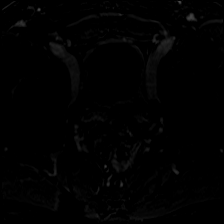
[im 684/684]
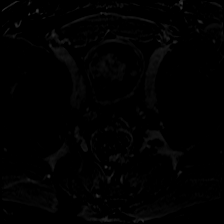

[Series 14: iliac crest thru · axial · 2.5mm · 1.19mm/px · z∈[-151,+107]mm · 2 of 104 slices shown (1 of 2)]
[im 1/104]
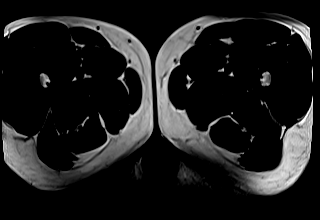
[im 104/104]
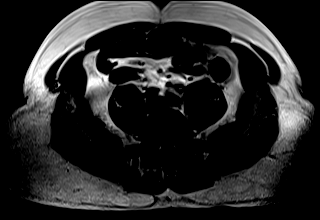

[Series 15: iliac crest thru · axial · 2.5mm · 1.19mm/px · z∈[-151,+107]mm · 2 of 104 slices shown (2 of 2)]
[im 1/104]
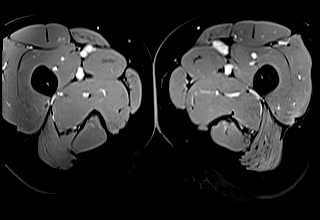
[im 104/104]
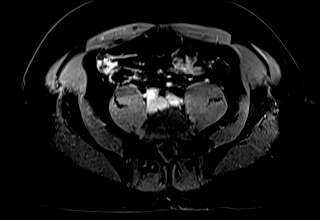

[48 of 48 positions shown; findings below may reference images not displayed]

FINDINGS: Prostate:

-- Peripheral Zone: Linear/wedge shaped hypointensities are noted on
ADC; however, no focal ADC hypointense or high b-value DWI
hyperintense nodules are identified.

-- Transition/Central Zone: Mildly enlarged; however, no suspicious
nodules are identified on T2-weighted or diffusion sequences.

-- Measurements/Volume:  4.7 by 2.6 x 5.0 cm (volume = 32 cm^3)

Transcapsular spread:  Absent

Seminal vesicle involvement:  Absent

Neurovascular bundle involvement:  Absent

Pelvic adenopathy: None visualized

Bone metastasis: None visualized

Other:  None
IMPRESSION: No radiographic evidence of high-grade prostate carcinoma. PI-RADS 2
(v.2.1): Low (clinically significant cancer unlikely)

## 2021-12-30 DIAGNOSIS — M25561 Pain in right knee: Secondary | ICD-10-CM | POA: Diagnosis not present

## 2022-01-03 DIAGNOSIS — N401 Enlarged prostate with lower urinary tract symptoms: Secondary | ICD-10-CM | POA: Diagnosis not present

## 2022-01-03 DIAGNOSIS — E785 Hyperlipidemia, unspecified: Secondary | ICD-10-CM | POA: Diagnosis not present

## 2022-01-03 DIAGNOSIS — R519 Headache, unspecified: Secondary | ICD-10-CM | POA: Diagnosis not present

## 2022-01-03 DIAGNOSIS — I10 Essential (primary) hypertension: Secondary | ICD-10-CM | POA: Diagnosis not present

## 2022-01-13 ENCOUNTER — Other Ambulatory Visit: Payer: Self-pay | Admitting: Cardiology

## 2022-01-13 DIAGNOSIS — I10 Essential (primary) hypertension: Secondary | ICD-10-CM

## 2022-01-20 ENCOUNTER — Other Ambulatory Visit: Payer: Self-pay | Admitting: Cardiology

## 2022-01-20 DIAGNOSIS — I10 Essential (primary) hypertension: Secondary | ICD-10-CM

## 2022-03-01 DIAGNOSIS — F32A Depression, unspecified: Secondary | ICD-10-CM | POA: Diagnosis not present

## 2022-03-01 DIAGNOSIS — R110 Nausea: Secondary | ICD-10-CM | POA: Diagnosis not present

## 2022-03-01 DIAGNOSIS — R944 Abnormal results of kidney function studies: Secondary | ICD-10-CM | POA: Diagnosis not present

## 2022-03-01 DIAGNOSIS — F4312 Post-traumatic stress disorder, chronic: Secondary | ICD-10-CM | POA: Diagnosis not present

## 2022-03-01 DIAGNOSIS — R809 Proteinuria, unspecified: Secondary | ICD-10-CM | POA: Diagnosis not present

## 2022-03-01 DIAGNOSIS — R438 Other disturbances of smell and taste: Secondary | ICD-10-CM | POA: Diagnosis not present

## 2022-03-01 DIAGNOSIS — R779 Abnormality of plasma protein, unspecified: Secondary | ICD-10-CM | POA: Diagnosis not present

## 2022-04-11 DIAGNOSIS — G43109 Migraine with aura, not intractable, without status migrainosus: Secondary | ICD-10-CM | POA: Diagnosis not present

## 2022-05-04 DIAGNOSIS — D631 Anemia in chronic kidney disease: Secondary | ICD-10-CM | POA: Diagnosis not present

## 2022-05-04 DIAGNOSIS — I129 Hypertensive chronic kidney disease with stage 1 through stage 4 chronic kidney disease, or unspecified chronic kidney disease: Secondary | ICD-10-CM | POA: Diagnosis not present

## 2022-05-04 DIAGNOSIS — Z23 Encounter for immunization: Secondary | ICD-10-CM | POA: Diagnosis not present

## 2022-05-04 DIAGNOSIS — R739 Hyperglycemia, unspecified: Secondary | ICD-10-CM | POA: Diagnosis not present

## 2022-05-04 DIAGNOSIS — N182 Chronic kidney disease, stage 2 (mild): Secondary | ICD-10-CM | POA: Diagnosis not present

## 2022-05-04 DIAGNOSIS — N179 Acute kidney failure, unspecified: Secondary | ICD-10-CM | POA: Diagnosis not present

## 2022-05-18 DIAGNOSIS — I10 Essential (primary) hypertension: Secondary | ICD-10-CM | POA: Diagnosis not present

## 2022-05-18 DIAGNOSIS — E782 Mixed hyperlipidemia: Secondary | ICD-10-CM | POA: Diagnosis not present

## 2022-05-18 DIAGNOSIS — R7302 Impaired glucose tolerance (oral): Secondary | ICD-10-CM | POA: Diagnosis not present

## 2022-05-18 DIAGNOSIS — Z125 Encounter for screening for malignant neoplasm of prostate: Secondary | ICD-10-CM | POA: Diagnosis not present

## 2022-05-18 DIAGNOSIS — M109 Gout, unspecified: Secondary | ICD-10-CM | POA: Diagnosis not present

## 2022-05-23 DIAGNOSIS — I10 Essential (primary) hypertension: Secondary | ICD-10-CM | POA: Diagnosis not present

## 2022-05-23 DIAGNOSIS — Z1331 Encounter for screening for depression: Secondary | ICD-10-CM | POA: Diagnosis not present

## 2022-05-23 DIAGNOSIS — Z0000 Encounter for general adult medical examination without abnormal findings: Secondary | ICD-10-CM | POA: Diagnosis not present

## 2022-05-23 DIAGNOSIS — R82998 Other abnormal findings in urine: Secondary | ICD-10-CM | POA: Diagnosis not present

## 2022-05-23 DIAGNOSIS — Z1339 Encounter for screening examination for other mental health and behavioral disorders: Secondary | ICD-10-CM | POA: Diagnosis not present

## 2022-06-30 DIAGNOSIS — E785 Hyperlipidemia, unspecified: Secondary | ICD-10-CM | POA: Diagnosis not present

## 2022-06-30 DIAGNOSIS — K649 Unspecified hemorrhoids: Secondary | ICD-10-CM | POA: Diagnosis not present

## 2022-06-30 DIAGNOSIS — I10 Essential (primary) hypertension: Secondary | ICD-10-CM | POA: Diagnosis not present

## 2022-06-30 DIAGNOSIS — G43909 Migraine, unspecified, not intractable, without status migrainosus: Secondary | ICD-10-CM | POA: Diagnosis not present

## 2022-07-21 ENCOUNTER — Encounter: Payer: Self-pay | Admitting: Cardiology

## 2022-07-21 DIAGNOSIS — F4310 Post-traumatic stress disorder, unspecified: Secondary | ICD-10-CM | POA: Diagnosis not present

## 2022-07-21 DIAGNOSIS — I517 Cardiomegaly: Secondary | ICD-10-CM | POA: Diagnosis not present

## 2022-07-21 DIAGNOSIS — I119 Hypertensive heart disease without heart failure: Secondary | ICD-10-CM | POA: Diagnosis not present

## 2022-07-21 DIAGNOSIS — I25119 Atherosclerotic heart disease of native coronary artery with unspecified angina pectoris: Secondary | ICD-10-CM | POA: Diagnosis not present

## 2022-07-22 DIAGNOSIS — Z0189 Encounter for other specified special examinations: Secondary | ICD-10-CM | POA: Diagnosis not present

## 2022-07-22 NOTE — Telephone Encounter (Signed)
From patient

## 2022-07-28 DIAGNOSIS — H524 Presbyopia: Secondary | ICD-10-CM | POA: Diagnosis not present

## 2022-07-28 DIAGNOSIS — H2513 Age-related nuclear cataract, bilateral: Secondary | ICD-10-CM | POA: Diagnosis not present

## 2022-07-28 DIAGNOSIS — H04123 Dry eye syndrome of bilateral lacrimal glands: Secondary | ICD-10-CM | POA: Diagnosis not present

## 2022-07-28 DIAGNOSIS — H35033 Hypertensive retinopathy, bilateral: Secondary | ICD-10-CM | POA: Diagnosis not present

## 2022-08-01 ENCOUNTER — Encounter: Payer: Self-pay | Admitting: Cardiology

## 2022-08-01 ENCOUNTER — Ambulatory Visit: Payer: Federal, State, Local not specified - PPO | Admitting: Cardiology

## 2022-08-01 VITALS — BP 136/89 | HR 61 | Resp 16 | Ht 72.0 in | Wt 246.0 lb

## 2022-08-01 DIAGNOSIS — E782 Mixed hyperlipidemia: Secondary | ICD-10-CM | POA: Diagnosis not present

## 2022-08-01 DIAGNOSIS — G4730 Sleep apnea, unspecified: Secondary | ICD-10-CM

## 2022-08-01 DIAGNOSIS — I2510 Atherosclerotic heart disease of native coronary artery without angina pectoris: Secondary | ICD-10-CM

## 2022-08-01 DIAGNOSIS — Z8673 Personal history of transient ischemic attack (TIA), and cerebral infarction without residual deficits: Secondary | ICD-10-CM

## 2022-08-01 DIAGNOSIS — I10 Essential (primary) hypertension: Secondary | ICD-10-CM

## 2022-08-01 DIAGNOSIS — R7303 Prediabetes: Secondary | ICD-10-CM

## 2022-08-01 NOTE — Progress Notes (Signed)
ID:  William Savage, DOB [birth year], MRN [id number]  PCP:  Reynold Bowen, MD  Cardiologist:  Rex Kras, DO, [facility] (established care 08/01/2022) Former Cardiology Providers: Dr. Adrian Prows  Date: 08/01/22 Last Office Visit: 11/12/2020  Chief Complaint  Patient presents with   Hypertension   Follow-up    HPI  William Savage is a 62 y.o. African-American ex [employer] whose  past medical history and cardiovascular risk factors include: Hypertension, hyperlipidemia, sleep apnea on CPAP, PTSD, mild nonobstructive CAD, obesity due to excess calories, Hx of TIA (around 2014),  prediabetes.  Patient was last seen by my partner in May 2022 and was asked to see on as-needed basis.  Recently had a visit at [facility] in January 2024 for precordial pain concerning for possible ACS.  He underwent left heart catheterization and was noted to have mild nonobstructive CAD with preserved LVEF per echocardiogram as well as ventriculography.   He now presents to the office to establish care.  No recurrence of anginal discomfort or heart failure symptoms.  FUNCTIONAL STATUS: No structured exercise program or daily routine.   ALLERGIES: No Known Allergies  MEDICATION LIST PRIOR TO VISIT: Current Meds  Medication Sig   aspirin 81 MG chewable tablet Chew 81 mg by mouth daily.   colchicine 0.6 MG tablet Take 0.6 mg by mouth daily. As needed for gout   hydrocortisone-pramoxine (PROCTOFOAM-HC) rectal foam INSERT 1 APPLICATORFUL RECTALLY TWICE A DAY AS NEEDED FOR HEMORRHOIDS   losartan-hydrochlorothiazide (HYZAAR) 100-25 MG tablet Take 1 tablet by mouth every morning.   nitroGLYCERIN (NITROSTAT) 0.4 MG SL tablet Place 1 tablet (0.4 mg total) under the tongue every 5 (five) minutes as needed for up to 25 days for chest pain.   prazosin (MINIPRESS) 1 MG capsule Take 1 mg by mouth in the morning, at noon, in the evening, and at bedtime. 1 tab 4 times a day   Psyllium (METAMUCIL) 48.57 % POWD Take by mouth.    Rimegepant Sulfate (NURTEC) 75 MG TBDP Take by mouth.   rosuvastatin (CRESTOR) 20 MG tablet TAKE 1 TABLET BY MOUTH EVERY DAY   sildenafil (VIAGRA) 100 MG tablet Take 1 tablet (100 mg total) by mouth daily as needed for erectile dysfunction.   topiramate (TOPAMAX) 25 MG tablet Take 25 mg by mouth daily.     PAST MEDICAL HISTORY: Past Medical History:  Diagnosis Date   Hyperlipidemia    Hypertension    Nonobstructive atherosclerosis of coronary artery    Sleep apnea    TIA (transient ischemic attack)     PAST SURGICAL HISTORY: Past Surgical History:  Procedure Laterality Date   COLONOSCOPY  2021    FAMILY HISTORY: The patient family history includes Diabetes in his mother, sister, and sister; Hypertension in his mother, sister, and sister; Stroke in his mother; Unexplained death in his father.  SOCIAL HISTORY:  The patient  reports that he has never smoked. He has never used smokeless tobacco. He reports that he does not currently use alcohol. He reports that he does not use drugs.  REVIEW OF SYSTEMS: Review of Systems  HENT:  Positive for tinnitus.   Cardiovascular:  Negative for chest pain, claudication, dyspnea on exertion, irregular heartbeat, leg swelling, near-syncope, orthopnea, palpitations, paroxysmal nocturnal dyspnea and syncope.  Respiratory:  Negative for shortness of breath.   Hematologic/Lymphatic: Negative for bleeding problem.  Musculoskeletal:  Negative for muscle cramps and myalgias.  Neurological:  Positive for light-headedness (orthostasis - at times). Negative for dizziness.  Migraines    PHYSICAL EXAM:    08/01/2022    9:44 AM 08/01/2022    9:43 AM 11/12/2020   11:30 AM  Vitals with BMI  Height  6' 0"$  6' 0"$   Weight  246 lbs 228 lbs  BMI  [id number]  Systolic [id number]  Diastolic 89 95 85  Pulse 61 59 61    Physical Exam  Constitutional: No distress.  Age appropriate, hemodynamically stable, ambulates w/ cane.   Neck: No JVD  present.  Cardiovascular: Normal rate, regular rhythm, S1 normal, S2 normal, intact distal pulses and normal pulses. Exam reveals no gallop, no S3 and no S4.  No murmur heard. Pulmonary/Chest: Effort normal and breath sounds normal. No stridor. He has no wheezes. He has no rales.  Abdominal: Soft. Bowel sounds are normal. He exhibits no distension. There is no abdominal tenderness.  Musculoskeletal:        General: No edema.     Cervical back: Neck supple.  Neurological: He is alert and oriented to person, place, and time. He has intact cranial nerves (2-12).  Skin: Skin is warm and moist.   CARDIAC DATABASE: EKG: 08/01/2022: Sinus rhythm, 61 bpm, normal axis, without underlying injury pattern.  Echocardiogram: PCV ECHOCARDIOGRAM COMPLETE 11/28/2019  Narrative Echocardiogram 11/28/2019: Normal LV systolic function with visual EF 60-65%. Left ventricle cavity is normal in size. Mild to moderate hypertrophy of the left ventricle. Normal global wall motion. Normal diastolic filling pattern, normal LAP. Calculated EF 59%. Mild pulmonic regurgitation. No prior study for comparison.   07/13/2022: [facility]where 1. Normal ventricular size and function; EF 55-60%.  2.  Normal LV diastolic function.  3.  Normal right ventricular size with mild reduction in function.  4.  No significant valvular abnormalities.   Stress Testing: PCV MYOCARDIAL PERFUSION WO LEXISCAN 12/18/2019  Narrative Exercise Sestamibi stress test 12/18/2019: Exercise nuclear stress test was performed using Bruce protocol. Patient reached 13.2 METS, and 88% of age predicted maximum heart rate. Exercise capacity was excellent. Chest pain not reported. Heart rate and hemodynamic response were normal. Stress EKG revealed no ischemic changes. Normal myocardial perfusion. Stress LVEF 53%. Low risk study.  Heart Catheterization: [facility] Everywhere: 07/12/2022 Hemodynamics: Aortic pressure 126/72,  LVEDP 13  2.  Coronary system:   Left Main: Large caliber vessel with no angiographic evidence of  stenosis.   LAD system: Large caliber vessel, wraps around the apex.  Mild 20% disease distally otherwise no significant stenosis   LCX system: Large caliber vessel.  No significant stenosis.   RCA system: right dominant.  20 to 30% distal otherwise no significant stenosis.   3. LV gram: EF 55 to 60% with normal wall motion.   Conclusion:  Mild non-obstructive CAD as mentioned above.  Normal LVEDP  Normal LV systolic function with normal wall motion  Acute coronary syndrome ruled out   Recommendations:  Continue risk factor modifications.    LABORATORY DATA:     No data to display             Latest Ref Rng & Units 11/29/2019   11:47 AM  CMP  Glucose 65 - 99 mg/dL 84   BUN 6 - 24 mg/dL 12   Creatinine 0.76 - 1.27 mg/dL 1.11   Sodium 134 - 144 mmol/L 142   Potassium 3.5 - 5.2 mmol/L 4.3   Chloride 96 - 106 mmol/L 103   CO2 20 - 29 mmol/L 26   Calcium 8.7 - 10.2  mg/dL 10.0     Lipid Panel  No results found for: "CHOL", "TRIG", "HDL", "CHOLHDL", "VLDL", "LDLCALC", "LDLDIRECT", "LABVLDL"  No components found for: "NTPROBNP" No results for input(s): "PROBNP" in the last 8760 hours. No results for input(s): "TSH" in the last 8760 hours.  BMP No results for input(s): "NA", "K", "CL", "CO2", "GLUCOSE", "BUN", "CREATININE", "CALCIUM", "GFRNONAA", "GFRAA" in the last 8760 hours.  HEMOGLOBIN A1C No results found for: "HGBA1C", "MPG"  IMPRESSION:    ICD-10-CM   1. Nonobstructive atherosclerosis of coronary artery  I25.10     2. Primary hypertension  I10 EKG 12-Lead    3. Mixed hyperlipidemia  E78.2     4. Sleep apnea in adult  G47.30     5. Prediabetes  R73.03     6. Hx of transient ischemic attack (TIA)  Z86.73        RECOMMENDATIONS: William Savage is a 62 y.o. African-American male whose past medical history and cardiac risk factors include: Hypertension,  hyperlipidemia, sleep apnea on CPAP, PTSD, mild nonobstructive CAD, obesity due to excess calories, Hx of TIA (around 2014),  prediabetes.  Mild Nonobstructive atherosclerosis of coronary artery Had an episode of precordial discomfort for which she had undergone left heart catheterization at [facility]. Based on left heart catheterization report he has mild nonobstructive CAD Continue aspirin given the nonobstructive CAD and history of TIA. Continue statin therapy. Educated him on the importance of improving his modifiable cardiovascular risk factors include but not limited to blood pressure management, lipid management, diabetes/glycemic control management. Encouraged him to increase physical activity as tolerated with a goal of 30 minutes a day 5 days a week Outside cath report and echocardiogram report reviewed via [place].  Primary hypertension Office blood pressures are within acceptable range. At times he has symptoms suggestive of orthostasis and therefore will not uptitrate GDMT. If orthostasis continues have asked him to discuss with his provider reducing the dose of prazosin as this can be contributing. Reemphasized importance of low-salt diet.  Mixed hyperlipidemia Currently on rosuvastatin.   He denies myalgia or other side effects. Recently checked by PCP-patient will provide records.  He does not want to have it rechecked  Currently managed by primary care provider.  Sleep apnea in adult Reemphasized the importance of device compliance.  Hx of transient ischemic attack (TIA) Continue aspirin and statin therapy. Emphasize importance secondary prevention.  He has his annual well visit with his PCP in July 2024.  I would like to see him back in August 2020 24 to 22-monthfollow-up given his newly discovered/diagnosed mild CAD.  If he remains stable would recommend 1 year follow-up visits thereafter. Plan of care discussed with wife and patient today.  Data Reviewed: I  have independently reviewed external notes provided by the referring provider as part of this office visit.   I have independently reviewed results of prior notes provide from Dr. GEinar Gip heart catheterization report and echo report from care everywhere, discharge summary, as part of medical decision making. I have ordered the following tests:  Orders Placed This Encounter  Procedures   EKG 12-Lead  I have made no medications changes at today's encounter as noted above. History of present illness was obtained by both patient and wife at today's office visit.    FINAL MEDICATION LIST END OF ENCOUNTER: No orders of the defined types were placed in this encounter.   Medications Discontinued During This Encounter  Medication Reason   rosuvastatin (CRESTOR) 10 MG tablet Completed Course  sertraline (ZOLOFT) 100 MG tablet Completed Course     Current Outpatient Medications:    aspirin 81 MG chewable tablet, Chew 81 mg by mouth daily., Disp: , Rfl:    colchicine 0.6 MG tablet, Take 0.6 mg by mouth daily. As needed for gout, Disp: , Rfl:    hydrocortisone-pramoxine (PROCTOFOAM-HC) rectal foam, INSERT 1 APPLICATORFUL RECTALLY TWICE A DAY AS NEEDED FOR HEMORRHOIDS, Disp: , Rfl:    losartan-hydrochlorothiazide (HYZAAR) 100-25 MG tablet, Take 1 tablet by mouth every morning., Disp: 90 tablet, Rfl: 3   nitroGLYCERIN (NITROSTAT) 0.4 MG SL tablet, Place 1 tablet (0.4 mg total) under the tongue every 5 (five) minutes as needed for up to 25 days for chest pain., Disp: 25 tablet, Rfl: 3   prazosin (MINIPRESS) 1 MG capsule, Take 1 mg by mouth in the morning, at noon, in the evening, and at bedtime. 1 tab 4 times a day, Disp: , Rfl:    Psyllium (METAMUCIL) 48.57 % POWD, Take by mouth., Disp: , Rfl:    Rimegepant Sulfate (NURTEC) 75 MG TBDP, Take by mouth., Disp: , Rfl:    rosuvastatin (CRESTOR) 20 MG tablet, TAKE 1 TABLET BY MOUTH EVERY DAY, Disp: 90 tablet, Rfl: 2   sildenafil (VIAGRA) 100 MG tablet, Take  1 tablet (100 mg total) by mouth daily as needed for erectile dysfunction., Disp: 20 tablet, Rfl: 3   topiramate (TOPAMAX) 25 MG tablet, Take 25 mg by mouth daily., Disp: , Rfl:   Orders Placed This Encounter  Procedures   EKG 12-Lead    There are no Patient Instructions on file for this visit.   --Continue cardiac medications as reconciled in final medication list. --No follow-ups on file. or sooner if needed. --Continue follow-up with your primary care physician regarding the management of your other chronic comorbid conditions.  Patient's questions and concerns were addressed to his satisfaction. He voices understanding of the instructions provided during this encounter.   This note was created using a voice recognition software as a result there may be grammatical errors inadvertently enclosed that do not reflect the nature of this encounter. Every attempt is made to correct such errors.  Rex Kras, [place], [facility]  Pager: [phone] Office: [phone]

## 2022-08-01 NOTE — Progress Notes (Signed)
Yours

## 2022-08-02 ENCOUNTER — Ambulatory Visit: Payer: Federal, State, Local not specified - PPO | Admitting: Cardiology

## 2022-08-04 DIAGNOSIS — Z5181 Encounter for therapeutic drug level monitoring: Secondary | ICD-10-CM | POA: Diagnosis not present

## 2022-08-04 DIAGNOSIS — I10 Essential (primary) hypertension: Secondary | ICD-10-CM | POA: Diagnosis not present

## 2022-08-04 DIAGNOSIS — E785 Hyperlipidemia, unspecified: Secondary | ICD-10-CM | POA: Diagnosis not present

## 2022-08-04 DIAGNOSIS — I2510 Atherosclerotic heart disease of native coronary artery without angina pectoris: Secondary | ICD-10-CM | POA: Diagnosis not present

## 2022-08-04 DIAGNOSIS — Z79899 Other long term (current) drug therapy: Secondary | ICD-10-CM | POA: Diagnosis not present

## 2022-08-04 DIAGNOSIS — F4310 Post-traumatic stress disorder, unspecified: Secondary | ICD-10-CM | POA: Diagnosis not present

## 2022-08-04 DIAGNOSIS — Z7982 Long term (current) use of aspirin: Secondary | ICD-10-CM | POA: Diagnosis not present

## 2022-08-10 DIAGNOSIS — H2513 Age-related nuclear cataract, bilateral: Secondary | ICD-10-CM | POA: Diagnosis not present

## 2022-08-10 DIAGNOSIS — H524 Presbyopia: Secondary | ICD-10-CM | POA: Diagnosis not present

## 2022-08-16 DIAGNOSIS — R109 Unspecified abdominal pain: Secondary | ICD-10-CM | POA: Diagnosis not present

## 2022-08-16 DIAGNOSIS — K625 Hemorrhage of anus and rectum: Secondary | ICD-10-CM | POA: Diagnosis not present

## 2022-08-16 DIAGNOSIS — I10 Essential (primary) hypertension: Secondary | ICD-10-CM | POA: Diagnosis not present

## 2022-08-16 DIAGNOSIS — K2960 Other gastritis without bleeding: Secondary | ICD-10-CM | POA: Diagnosis not present

## 2022-08-29 DIAGNOSIS — M7752 Other enthesopathy of left foot: Secondary | ICD-10-CM | POA: Diagnosis not present

## 2022-08-29 DIAGNOSIS — M7731 Calcaneal spur, right foot: Secondary | ICD-10-CM | POA: Diagnosis not present

## 2022-08-29 DIAGNOSIS — M19072 Primary osteoarthritis, left ankle and foot: Secondary | ICD-10-CM | POA: Diagnosis not present

## 2022-08-29 DIAGNOSIS — M19071 Primary osteoarthritis, right ankle and foot: Secondary | ICD-10-CM | POA: Diagnosis not present

## 2022-08-29 DIAGNOSIS — G43909 Migraine, unspecified, not intractable, without status migrainosus: Secondary | ICD-10-CM | POA: Diagnosis not present

## 2022-09-07 DIAGNOSIS — I517 Cardiomegaly: Secondary | ICD-10-CM | POA: Diagnosis not present

## 2022-09-07 DIAGNOSIS — I25119 Atherosclerotic heart disease of native coronary artery with unspecified angina pectoris: Secondary | ICD-10-CM | POA: Diagnosis not present

## 2022-09-07 DIAGNOSIS — I119 Hypertensive heart disease without heart failure: Secondary | ICD-10-CM | POA: Diagnosis not present

## 2022-09-07 DIAGNOSIS — R7302 Impaired glucose tolerance (oral): Secondary | ICD-10-CM | POA: Diagnosis not present

## 2022-09-07 DIAGNOSIS — E782 Mixed hyperlipidemia: Secondary | ICD-10-CM | POA: Diagnosis not present

## 2022-09-09 ENCOUNTER — Encounter: Payer: Self-pay | Admitting: Cardiology

## 2022-09-09 ENCOUNTER — Other Ambulatory Visit: Payer: Self-pay

## 2022-09-09 NOTE — Telephone Encounter (Signed)
From patient.

## 2022-09-09 NOTE — Telephone Encounter (Signed)
Please update his MAR.  Helayna Dun Graham, DO, [facility]

## 2022-09-15 ENCOUNTER — Ambulatory Visit (INDEPENDENT_AMBULATORY_CARE_PROVIDER_SITE_OTHER): Payer: Federal, State, Local not specified - PPO

## 2022-09-15 ENCOUNTER — Ambulatory Visit: Payer: Federal, State, Local not specified - PPO | Admitting: Podiatry

## 2022-09-15 DIAGNOSIS — R52 Pain, unspecified: Secondary | ICD-10-CM

## 2022-09-15 DIAGNOSIS — M722 Plantar fascial fibromatosis: Secondary | ICD-10-CM

## 2022-09-15 DIAGNOSIS — M25371 Other instability, right ankle: Secondary | ICD-10-CM | POA: Diagnosis not present

## 2022-09-15 DIAGNOSIS — M7662 Achilles tendinitis, left leg: Secondary | ICD-10-CM

## 2022-09-15 DIAGNOSIS — M7661 Achilles tendinitis, right leg: Secondary | ICD-10-CM

## 2022-09-15 DIAGNOSIS — M25372 Other instability, left ankle: Secondary | ICD-10-CM

## 2022-09-15 DIAGNOSIS — Q666 Other congenital valgus deformities of feet: Secondary | ICD-10-CM | POA: Diagnosis not present

## 2022-09-15 MED ORDER — MELOXICAM 15 MG PO TABS: 15.0000 mg | ORAL_TABLET | Freq: Every day | ORAL | 0 refills | Status: DC | PRN

## 2022-09-15 NOTE — Patient Instructions (Signed)

## 2022-09-15 NOTE — Progress Notes (Addendum)
Subjective:   Patient ID: William Savage, male   DOB: 62 y.o.   MRN: [id number]   HPI Chief Complaint  Patient presents with   Foot Pain    Pain located in bilateral foot and ankle, pain is consistent, pain started in 1984, Patient was in the [employer], Patient performed in a lot of strenuous activities and sports that played a part in pain, prior treatment includes bunion surgery in 2019, PT, and diclofenac     62 year old male presents the office today with concerns of chronic pain to both of his foot and ankle.  This started in 1984.  He was in the [employer] and he was on his feet quite a bit and played a lot of sports as well.  He is continue to have pain in both of his feet.    The ankles have been unstable and popping. He has bunions that hurt, especially on little toes. He feels like his ankles are unstable. He went to the [place] and he had x-rays and he was told he had PF and arthritis. He is taking diclofenac cream. He has had PT in the past. The right is worse thn left but it goes back and forth.  He does report some numbness and tingling into the toes as well as intermittent swelling.  He does not report any recent injuries.   Review of Systems  All other systems reviewed and are negative.  Past Medical History:  Diagnosis Date   Hyperlipidemia    Hypertension    Nonobstructive atherosclerosis of coronary artery    Sleep apnea    TIA (transient ischemic attack)     Past Surgical History:  Procedure Laterality Date   COLONOSCOPY  2021     Current Outpatient Medications:    meloxicam (MOBIC) 15 MG tablet, Take 1 tablet (15 mg total) by mouth daily as needed for pain., Disp: 30 tablet, Rfl: 0   aspirin 81 MG chewable tablet, Chew 81 mg by mouth daily., Disp: , Rfl:    colchicine 0.6 MG tablet, Take 0.6 mg by mouth daily. As needed for gout, Disp: , Rfl:    hydrocortisone-pramoxine (PROCTOFOAM-HC) rectal foam, INSERT 1 APPLICATORFUL RECTALLY TWICE A DAY AS NEEDED FOR  HEMORRHOIDS, Disp: , Rfl:    losartan-hydrochlorothiazide (HYZAAR) 100-25 MG tablet, Take 1 tablet by mouth every morning., Disp: 90 tablet, Rfl: 3   nitroGLYCERIN (NITROSTAT) 0.4 MG SL tablet, Place 1 tablet (0.4 mg total) under the tongue every 5 (five) minutes as needed for up to 25 days for chest pain., Disp: 25 tablet, Rfl: 3   Psyllium (METAMUCIL) 48.57 % POWD, Take by mouth., Disp: , Rfl:    Rimegepant Sulfate (NURTEC) 75 MG TBDP, Take by mouth., Disp: , Rfl:    rosuvastatin (CRESTOR) 20 MG tablet, TAKE 1 TABLET BY MOUTH EVERY DAY, Disp: 90 tablet, Rfl: 2   sildenafil (VIAGRA) 100 MG tablet, Take 1 tablet (100 mg total) by mouth daily as needed for erectile dysfunction., Disp: 20 tablet, Rfl: 3   topiramate (TOPAMAX) 25 MG tablet, Take 25 mg by mouth daily., Disp: , Rfl:   Allergies  Allergen Reactions   Mushroom Extract Complex Other (See Comments)    Gout flares, joint pain   Other Other (See Comments)    Allergic to purines    Shellfish Allergy Other (See Comments)          Objective:  Physical Exam  General: AAO x3, NAD  Dermatological: Skin is warm, dry and supple  bilateral.  There are no open sores, no preulcerative lesions, no rash or signs of infection present.  Vascular: Dorsalis Pedis artery and Posterior Tibial artery pedal pulses are 2/4 bilateral with immedate capillary fill time. There is no pain with calf compression, swelling, warmth, erythema.   Neruologic: Grossly intact via light touch bilateral.  Negative Tinel sign.  Musculoskeletal: Decreased medial arch upon weightbearing.  Equinus is present.  Not able to any area pinpoint tenderness.  He does get discomfort in the plantar aspect heel on the insertion of the plantar fascia.  Most of the pain on the Achilles tendon today.  Bunions are present with tenderness along the bunion, tailor's bunion.  No significant instability of ankle noted today.  There is no palpable neuroma today.  Gait: Unassisted,  Nonantalgic.       Assessment:   62 year old male with chronic foot pain, Plantar fasciitis, bunions     Plan:  -Treatment options discussed including all alternatives, risks, and complications -Etiology of symptoms were discussed -X-rays were obtained and reviewed with the patient.  3 views bilateral feet and 2 views right ankle were obtained.  There is no evidence of acute fracture bilaterally.  On the left side of the anterior aspect of the ankle is a spur noted on the lateral view.  There is decreased calcaneal inclination angle bilaterally.  On the right side likely old injury to the anterior process of the calcaneus noted on lateral view.  Mild to moderate bunions, tailor's bunions are present. Mild narrowing of the medial ankle gutter.  -Long discussion today in regards to his treatment options given his chronic ongoing pain to his feet.  I did offer steroid injection today. -Prescribed mobic. Discussed side effects of the medication and directed to stop if any are to occur and call the office.  -We discussed stretching, icing as well as physical therapy to help. -Discussed she is good arch supports include custom molded orthotics.  She measured for inserts. -He also his mother multiple joint issues including knees and he is actually in a wheelchair today with his brace.  I do think that his other issues could also be contributing to his foot pain, gait instability. -Measured for orthotics today.  Vivi Barrack DPM

## 2022-09-26 NOTE — Addendum Note (Signed)
Addended by: Vivi Barrack on: 09/26/2022 07:36 PM   Modules accepted: Orders

## 2022-10-04 DIAGNOSIS — Z5181 Encounter for therapeutic drug level monitoring: Secondary | ICD-10-CM | POA: Diagnosis not present

## 2022-10-04 DIAGNOSIS — Z79899 Other long term (current) drug therapy: Secondary | ICD-10-CM | POA: Diagnosis not present

## 2022-10-04 DIAGNOSIS — G4700 Insomnia, unspecified: Secondary | ICD-10-CM | POA: Diagnosis not present

## 2022-10-04 DIAGNOSIS — F4310 Post-traumatic stress disorder, unspecified: Secondary | ICD-10-CM | POA: Diagnosis not present

## 2022-10-12 ENCOUNTER — Telehealth: Payer: Self-pay | Admitting: Podiatry

## 2022-10-12 NOTE — Telephone Encounter (Signed)
Lmom to call back to set up appt to pick up orthotics       Balance pending

## 2022-10-19 ENCOUNTER — Other Ambulatory Visit: Payer: Federal, State, Local not specified - PPO

## 2022-10-24 DIAGNOSIS — M21621 Bunionette of right foot: Secondary | ICD-10-CM | POA: Diagnosis not present

## 2022-10-24 DIAGNOSIS — M25372 Other instability, left ankle: Secondary | ICD-10-CM | POA: Diagnosis not present

## 2022-10-24 DIAGNOSIS — M25572 Pain in left ankle and joints of left foot: Secondary | ICD-10-CM | POA: Diagnosis not present

## 2022-11-03 DIAGNOSIS — D631 Anemia in chronic kidney disease: Secondary | ICD-10-CM | POA: Diagnosis not present

## 2022-11-03 DIAGNOSIS — N182 Chronic kidney disease, stage 2 (mild): Secondary | ICD-10-CM | POA: Diagnosis not present

## 2022-11-03 DIAGNOSIS — Z79899 Other long term (current) drug therapy: Secondary | ICD-10-CM | POA: Diagnosis not present

## 2022-11-03 DIAGNOSIS — I129 Hypertensive chronic kidney disease with stage 1 through stage 4 chronic kidney disease, or unspecified chronic kidney disease: Secondary | ICD-10-CM | POA: Diagnosis not present

## 2022-11-06 ENCOUNTER — Other Ambulatory Visit: Payer: Self-pay | Admitting: Podiatry

## 2022-11-15 ENCOUNTER — Ambulatory Visit (INDEPENDENT_AMBULATORY_CARE_PROVIDER_SITE_OTHER): Payer: Federal, State, Local not specified - PPO

## 2022-11-15 ENCOUNTER — Ambulatory Visit: Payer: Federal, State, Local not specified - PPO | Admitting: Podiatry

## 2022-11-15 DIAGNOSIS — M25372 Other instability, left ankle: Secondary | ICD-10-CM | POA: Diagnosis not present

## 2022-11-15 DIAGNOSIS — Q666 Other congenital valgus deformities of feet: Secondary | ICD-10-CM | POA: Diagnosis not present

## 2022-11-15 DIAGNOSIS — M722 Plantar fascial fibromatosis: Secondary | ICD-10-CM

## 2022-11-15 NOTE — Progress Notes (Signed)
Subjective: Chief Complaint  Patient presents with   Plantar Fasciitis    Rm 12 follow up bilateral foot pain. Pt states there is improvement since last visit. New orthotics, pf braces and bunion gel covers helps a lot.    62 year old male presents with the above concerns.  He states that they have been some improvement.  He does report a 18 days ago he did fall after his ankle gave out on the left side.  He hurt his back and neck.  Did not seek any treatment.  Objective: AAO x3, NAD DP/PT pulses palpable bilaterally, CRT less than 3 seconds He does get tenderness on the distal portion of fibula or lateral ankle complex.  Clinically the Achilles tendon appears intact.  Increased talar tilt noted.  Mild discomfort on the right as well as plantar fascia.  No other areas of pinpoint tenderness. No pain with calf compression, swelling, warmth, erythema  Assessment: 62 year old male with ankle instability, plantar fasciitis, chronic foot pain  Plan: -All treatment options discussed with the patient including all alternatives, risks, complications.  -X-rays were obtained and reviewed for her left ankle.  3 views were obtained.  No evidence of acute fracture.  Mild narrowing along the medial, lateral ankle gutters.  On the lateral view spurring present anterior ankle. -Given his recent fall we discussed different treatment options including getting MRI as well.  Most often this will restart physical therapy.  Referral to [facility] physical therapy was faxed. -Continue shoes, good arch support. -Continue offloading to the bunions. -Patient encouraged to call the office with any questions, concerns, change in symptoms.   Vivi Barrack DPM

## 2022-11-23 DIAGNOSIS — M25372 Other instability, left ankle: Secondary | ICD-10-CM | POA: Diagnosis not present

## 2022-11-23 DIAGNOSIS — M25562 Pain in left knee: Secondary | ICD-10-CM | POA: Diagnosis not present

## 2022-11-23 DIAGNOSIS — M25571 Pain in right ankle and joints of right foot: Secondary | ICD-10-CM | POA: Diagnosis not present

## 2022-11-23 DIAGNOSIS — M5459 Other low back pain: Secondary | ICD-10-CM | POA: Diagnosis not present

## 2022-11-24 DIAGNOSIS — I10 Essential (primary) hypertension: Secondary | ICD-10-CM | POA: Diagnosis not present

## 2022-11-24 DIAGNOSIS — R7302 Impaired glucose tolerance (oral): Secondary | ICD-10-CM | POA: Diagnosis not present

## 2022-11-30 DIAGNOSIS — M25372 Other instability, left ankle: Secondary | ICD-10-CM | POA: Diagnosis not present

## 2022-11-30 DIAGNOSIS — M5459 Other low back pain: Secondary | ICD-10-CM | POA: Diagnosis not present

## 2022-11-30 DIAGNOSIS — M25571 Pain in right ankle and joints of right foot: Secondary | ICD-10-CM | POA: Diagnosis not present

## 2022-11-30 DIAGNOSIS — M25562 Pain in left knee: Secondary | ICD-10-CM | POA: Diagnosis not present

## 2022-12-06 DIAGNOSIS — M5459 Other low back pain: Secondary | ICD-10-CM | POA: Diagnosis not present

## 2022-12-06 DIAGNOSIS — M25372 Other instability, left ankle: Secondary | ICD-10-CM | POA: Diagnosis not present

## 2022-12-06 DIAGNOSIS — M25562 Pain in left knee: Secondary | ICD-10-CM | POA: Diagnosis not present

## 2022-12-06 DIAGNOSIS — M25571 Pain in right ankle and joints of right foot: Secondary | ICD-10-CM | POA: Diagnosis not present

## 2022-12-12 DIAGNOSIS — M5416 Radiculopathy, lumbar region: Secondary | ICD-10-CM | POA: Diagnosis not present

## 2022-12-13 DIAGNOSIS — M5136 Other intervertebral disc degeneration, lumbar region: Secondary | ICD-10-CM | POA: Diagnosis not present

## 2022-12-13 DIAGNOSIS — M19042 Primary osteoarthritis, left hand: Secondary | ICD-10-CM | POA: Diagnosis not present

## 2022-12-13 DIAGNOSIS — M25762 Osteophyte, left knee: Secondary | ICD-10-CM | POA: Diagnosis not present

## 2022-12-13 DIAGNOSIS — M47816 Spondylosis without myelopathy or radiculopathy, lumbar region: Secondary | ICD-10-CM | POA: Diagnosis not present

## 2022-12-13 DIAGNOSIS — M19041 Primary osteoarthritis, right hand: Secondary | ICD-10-CM | POA: Diagnosis not present

## 2022-12-13 DIAGNOSIS — M25761 Osteophyte, right knee: Secondary | ICD-10-CM | POA: Diagnosis not present

## 2022-12-13 DIAGNOSIS — M769 Unspecified enthesopathy, lower limb, excluding foot: Secondary | ICD-10-CM | POA: Diagnosis not present

## 2022-12-13 DIAGNOSIS — M4316 Spondylolisthesis, lumbar region: Secondary | ICD-10-CM | POA: Diagnosis not present

## 2022-12-13 DIAGNOSIS — M542 Cervicalgia: Secondary | ICD-10-CM | POA: Diagnosis not present

## 2022-12-23 DIAGNOSIS — M545 Low back pain, unspecified: Secondary | ICD-10-CM | POA: Diagnosis not present

## 2022-12-23 DIAGNOSIS — Z79899 Other long term (current) drug therapy: Secondary | ICD-10-CM | POA: Diagnosis not present

## 2022-12-23 DIAGNOSIS — M542 Cervicalgia: Secondary | ICD-10-CM | POA: Diagnosis not present

## 2022-12-23 DIAGNOSIS — Z5181 Encounter for therapeutic drug level monitoring: Secondary | ICD-10-CM | POA: Diagnosis not present

## 2022-12-28 DIAGNOSIS — M25571 Pain in right ankle and joints of right foot: Secondary | ICD-10-CM | POA: Diagnosis not present

## 2022-12-28 DIAGNOSIS — M25372 Other instability, left ankle: Secondary | ICD-10-CM | POA: Diagnosis not present

## 2022-12-28 DIAGNOSIS — M5459 Other low back pain: Secondary | ICD-10-CM | POA: Diagnosis not present

## 2022-12-28 DIAGNOSIS — M25562 Pain in left knee: Secondary | ICD-10-CM | POA: Diagnosis not present

## 2023-01-02 DIAGNOSIS — M25372 Other instability, left ankle: Secondary | ICD-10-CM | POA: Diagnosis not present

## 2023-01-02 DIAGNOSIS — M25571 Pain in right ankle and joints of right foot: Secondary | ICD-10-CM | POA: Diagnosis not present

## 2023-01-02 DIAGNOSIS — M21621 Bunionette of right foot: Secondary | ICD-10-CM | POA: Diagnosis not present

## 2023-01-02 DIAGNOSIS — R519 Headache, unspecified: Secondary | ICD-10-CM | POA: Diagnosis not present

## 2023-01-02 DIAGNOSIS — Z87820 Personal history of traumatic brain injury: Secondary | ICD-10-CM | POA: Diagnosis not present

## 2023-01-02 DIAGNOSIS — M25572 Pain in left ankle and joints of left foot: Secondary | ICD-10-CM | POA: Diagnosis not present

## 2023-01-16 ENCOUNTER — Ambulatory Visit: Payer: Federal, State, Local not specified - PPO | Admitting: Podiatry

## 2023-01-16 DIAGNOSIS — M25561 Pain in right knee: Secondary | ICD-10-CM

## 2023-01-16 DIAGNOSIS — M25562 Pain in left knee: Secondary | ICD-10-CM | POA: Diagnosis not present

## 2023-01-16 DIAGNOSIS — M722 Plantar fascial fibromatosis: Secondary | ICD-10-CM | POA: Diagnosis not present

## 2023-01-16 DIAGNOSIS — Q666 Other congenital valgus deformities of feet: Secondary | ICD-10-CM

## 2023-01-16 DIAGNOSIS — G8929 Other chronic pain: Secondary | ICD-10-CM

## 2023-01-16 NOTE — Progress Notes (Signed)
Subjective: Chief Complaint  Patient presents with   Foot Pain    Pt stated that he is still having pain and discomfort the brace does help some      62 year old male presents with the above concerns.  He has seen some improvement with the braces he has been doing physical therapy and getting ready to start aquatic therapy.  Still ongoing back, hip, knee pain.  He does see a rheumatologist in October.  Objective: AAO x3, NAD DP/PT pulses palpable bilaterally, CRT less than 3 seconds There is still hematuria tenderness along the plantar aspect calcaneus on insertion of plantar fascia.  No significant along the Achilles tendon today.  Mild diffuse discomfort on the ankle.  There is no specific area of pinpoint tenderness today.  No pain with calf compression, swelling, warmth, erythema  Assessment: 62 year old male with ankle instability, plantar fasciitis, chronic foot pain  Plan: -All treatment options discussed with the patient including all alternatives, risks, complications.  -Offered steroid injection but he wanted to held off on this today.  Will continue with bracing for now as well as physical therapy.  Discussed home stretching, icing on a regular basis.  Continue shoes, good arch support. -Referral to orthopedics given hip, knee, back pain  Return in about 2 months (around 03/19/2023).  Vivi Barrack DPM

## 2023-01-16 NOTE — Patient Instructions (Signed)
Choose a moisturizer from  the list below:  For normal skin: Moisturize feet once daily; do not apply between toes A.  CeraVe Daily Moisturizing Lotion B.  Lubriderm Advanced Therapy Lotion or Lubriderm Intense Skin Repair Lotion C.  Vaseline Intensive Care Lotion D.  Gold Bond Ultimate Diabetic Foot Lotion E.  Eucerin Intensive Repair Moisturizing Lotion  For extremely dry, cracked feet: moisturize feet once daily; do not apply between toes A. CeraVe Healing Ointment B. Eucerin Aquaphor Repairing Ointment (may be labeled Aquaphor Healing Ointment) C. Vaseline Petroleum Healing Jelly   If you have problems reaching your feet: apply to feet once daily; do not apply between toes A.  Eucerin Aquaphor Ointment Body Spray  B.  Vaseline Intensive Care Spray Moisturizer (Unscented,  Cocoa Radiant Spray or Aloe Smooth Spray)    

## 2023-01-17 DIAGNOSIS — M25562 Pain in left knee: Secondary | ICD-10-CM | POA: Diagnosis not present

## 2023-01-17 DIAGNOSIS — Z9181 History of falling: Secondary | ICD-10-CM | POA: Diagnosis not present

## 2023-01-25 ENCOUNTER — Other Ambulatory Visit (INDEPENDENT_AMBULATORY_CARE_PROVIDER_SITE_OTHER): Payer: Federal, State, Local not specified - PPO

## 2023-01-25 ENCOUNTER — Ambulatory Visit: Payer: Federal, State, Local not specified - PPO | Admitting: Orthopedic Surgery

## 2023-01-25 ENCOUNTER — Encounter: Payer: Self-pay | Admitting: Orthopedic Surgery

## 2023-01-25 DIAGNOSIS — M542 Cervicalgia: Secondary | ICD-10-CM | POA: Diagnosis not present

## 2023-01-25 DIAGNOSIS — G8929 Other chronic pain: Secondary | ICD-10-CM

## 2023-01-25 DIAGNOSIS — M545 Low back pain, unspecified: Secondary | ICD-10-CM

## 2023-01-25 DIAGNOSIS — M25561 Pain in right knee: Secondary | ICD-10-CM

## 2023-01-25 DIAGNOSIS — M25562 Pain in left knee: Secondary | ICD-10-CM

## 2023-01-25 NOTE — Progress Notes (Signed)
Office Visit Note   Patient: William Savage           Date of Birth: 12-Nov-1960           MRN: [id number] Visit Date: 01/25/2023 Requested by: Vivi Barrack, DPM [address] [place],  [place] [address] PCP: Adrian Prince, MD  Subjective: Chief Complaint  Patient presents with   Lower Back - Pain   Neck - Pain   Right Knee - Pain   Left Knee - Pain    HPI: William Savage is a 62 y.o. male who presents to the office reporting multiple orthopedic complaints today.  Patient describes bilateral knee pain since the mid 1980s.  He states he injured the knees playing football in the [employer]	[place].  Does not have a discrete injury which required surgery but does report multiple blows to each knee.  Left knee is more symptomatic than the right knee.  He does use compression sleeves bilaterally.  Also uses a walker.  Does report some giving way in the knees.  Describes swelling and weakness.  He has tried physical therapy at the [place] but does not go to therapy much anymore.  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  States his knees would swell at that time as well.  He states he is using a walker following a fall in May.  He does report back pain as well.  Uses a lumbar support for this.  Has never had prior surgery or MRI scans on the back or knee.  States he injured his back in the service.  He states that time he cannot move.  He does do aqua physical therapy as well for this problem but has not gone very much recently.  Patient describes about 10 minutes of walking endurance due to both back and knee issues.  Patient also reports neck pain as well as bilateral numbness and tingling in digits 4 and 5 in the fingers.  This happens both night and day by his report.  Does not really report any loss of dexterity in the hands.  Takes meloxicam and ibuprofen as well as using Voltaren gel for his issues.  Does have some pain which starts in the back of his neck and radiates  down into the upper shoulder scapular region on that left-hand side.              ROS: All systems reviewed are negative as they relate to the chief complaint within the history of present illness.  Patient denies fevers or chills.  Assessment & Plan: Visit Diagnoses:  1. Chronic pain of both knees   2. Low back pain, unspecified back pain laterality, unspecified chronicity, unspecified whether sciatica present   3. Neck pain     Plan: Impression is multiple orthopedic complaints.  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  Manual motor testing was also performed but extending his knees was very painful for him and hip flexion was also somewhat difficult for him to perform.  Due to pain and weakness.  Did not have any groin pain on that side with mobilization.  Regarding the back he does have definite L5-S1 arthritis as well as very mild spondylolisthesis.  I think he may also have some ulnar nerve compression and irritation without wasting or abduction weakness.  In addition to these findings he seems to be more  incapacitated/disabled from his conditions that I would expect just from these moderate structural problems identified today.  Talked with him and his wife at length about treatment directions to pursue.  From an orthopedic standpoint to further workup the issues we talked about today I would recommend MRI scan of the back lumbar spine with possible ESI's to follow.  Could also image both knees although on the left-hand side there is no surgical intervention that would be performed outside of knee replacement due to the severity of the arthritis present on that medial side.  The right knee has some instability but I do not think it is enough to warrant intervention in this patient who is very leery of any surgical intervention.  I think nerve conduction studies on bilateral upper extremities could also be  performed to evaluate that numbness and tingling in the ulnar distribution.  Neck MRI could also be considered if that study was inconclusive about the site of nerve compression.  He is going to consider his options and let me know if he wants to proceed with anything.  Also talked about knee injections today for that left knee which would be cortisone alternating with gel injections but again this is something he wants to discuss and consider.  Follow-Up Instructions: No follow-ups on file.   Orders:  Orders Placed This Encounter  Procedures   XR KNEE 3 VIEW RIGHT   XR Knee 1-2 Views Left   XR Cervical Spine 2 or 3 views   XR Lumbar Spine 2-3 Views   No orders of the defined types were placed in this encounter.     Procedures: No procedures performed   Clinical Data: No additional findings.  Objective: Vital Signs: There were no vitals taken for this visit.  Physical Exam:  Constitutional: Patient appears well-developed HEENT:  Head: Normocephalic Eyes:EOM are normal Neck: Normal range of motion Cardiovascular: Normal rate Pulmonary/chest: Effort normal Neurologic: Patient is alert Skin: Skin is warm Psychiatric: Patient has normal mood and affect  Ortho Exam: Ortho exam demonstrates slight varus alignment on the left-hand side.  He does have pretty reasonable ankle dorsiflexion plantarflexion quad and hamstring strength but fully extending the legs is painful for him.  No clonus present.  Hip flexion strength is reasonable but again he is somewhat unable to fully flex the hips due to some pain.  Notably there is no groin pain with internal or external rotation of either hip joint.  No trochanteric tenderness.  5 out of 5 grip EPL FPL interosseous are/extension biceps triceps and deltoid strength.  Cervical spine range of motion is flexion chin to chest but extension only about 20 degrees.  Rotation is about 35 degrees bilaterally.  Specialty Comments:  No specialty comments  available.  Imaging: No results found.   PMFS History: There are no problems to display for this patient.  Past Medical History:  Diagnosis Date   Hyperlipidemia    Hypertension    Nonobstructive atherosclerosis of coronary artery    Sleep apnea    TIA (transient ischemic attack)     Family History  Problem Relation Age of Onset   Diabetes Mother    Stroke Mother    Hypertension Mother    Unexplained death Father    Hypertension Sister    Diabetes Sister    Hypertension Sister    Diabetes Sister     Past Surgical History:  Procedure Laterality Date   COLONOSCOPY  2021   Social History   Occupational History  Not on file  Tobacco Use   Smoking status: Never   Smokeless tobacco: Never  Vaping Use   Vaping status: Never Used  Substance and Sexual Activity   Alcohol use: Not Currently   Drug use: Never   Sexual activity: Not on file

## 2023-01-26 DIAGNOSIS — M170 Bilateral primary osteoarthritis of knee: Secondary | ICD-10-CM | POA: Diagnosis not present

## 2023-01-31 DIAGNOSIS — M25562 Pain in left knee: Secondary | ICD-10-CM | POA: Diagnosis not present

## 2023-02-02 ENCOUNTER — Ambulatory Visit (HOSPITAL_BASED_OUTPATIENT_CLINIC_OR_DEPARTMENT_OTHER): Payer: No Typology Code available for payment source | Admitting: Physical Therapy

## 2023-02-06 ENCOUNTER — Ambulatory Visit: Payer: Federal, State, Local not specified - PPO | Admitting: Cardiology

## 2023-02-06 ENCOUNTER — Encounter: Payer: Self-pay | Admitting: Cardiology

## 2023-02-06 VITALS — BP 147/97 | HR 64 | Resp 16 | Ht 72.0 in | Wt 252.8 lb

## 2023-02-06 DIAGNOSIS — E782 Mixed hyperlipidemia: Secondary | ICD-10-CM

## 2023-02-06 DIAGNOSIS — I2510 Atherosclerotic heart disease of native coronary artery without angina pectoris: Secondary | ICD-10-CM

## 2023-02-06 DIAGNOSIS — E7800 Pure hypercholesterolemia, unspecified: Secondary | ICD-10-CM

## 2023-02-06 DIAGNOSIS — I10 Essential (primary) hypertension: Secondary | ICD-10-CM

## 2023-02-06 DIAGNOSIS — G4730 Sleep apnea, unspecified: Secondary | ICD-10-CM

## 2023-02-06 DIAGNOSIS — Z8673 Personal history of transient ischemic attack (TIA), and cerebral infarction without residual deficits: Secondary | ICD-10-CM

## 2023-02-06 MED ORDER — ROSUVASTATIN CALCIUM 20 MG PO TABS
20.0000 mg | ORAL_TABLET | Freq: Every day | ORAL | 2 refills | Status: DC
Start: 2023-02-06 — End: 2023-12-25

## 2023-02-06 NOTE — Progress Notes (Signed)
ID:  William Savage, DOB 02-10-1961, MRN [id number]  PCP:  Adrian Prince, MD  Cardiologist:  Tessa Lerner, DO, [facility] (established care 08/01/2022) Former Cardiology Providers: Dr. Yates Decamp  Date: 02/06/23 Last Office Visit: 08/01/2022  Chief Complaint  Patient presents with   Nonobstructive atherosclerosis of coronary artery   Follow-up    HPI  William Savage is a 63 y.o. African-American ex [employer] whose  past medical history and cardiovascular risk factors include: Hypertension, hyperlipidemia, sleep apnea on CPAP, PTSD, mild nonobstructive CAD, obesity due to excess calories, Hx of TIA (around 2014),  prediabetes.  Patient presents today for 20-month follow-up visit given his history of mild nonobstructive disease.  In January 2024 due to precordial discomfort he had gone to [facility] underwent left heart catheterization was noted to have mild nonobstructive CAD and ACS was ruled out.  Shared decision was to follow-up in 54-month basis to reevaluate his risk profile and symptoms.  Over the last 6 months he is doing okay from a cardiovascular standpoint.  He had a fall in May 2024 for which he is undergoing physical therapy.  He has had 1 episode of precordial discomfort mid to late May 2024 located anterior chest wall, noted after going up a flight of stairs, improves with resting and aspirin.  Overall intensity frequency and duration was much less compared to his event back in January 2024 when he went to [facility].  Today he denies anginal chest pain or heart failure symptoms.  But due to migraine and back pain he is resting in bed as opposed to sitting up in chair.  Patient states that he has had labs in the interim at the [facility] I do not have those records available for review.  Minipress has also been discontinued by his [place] provider.  FUNCTIONAL STATUS: No structured exercise program or daily routine.   ALLERGIES: Allergies  Allergen Reactions   Mushroom Extract  Complex Other (See Comments)    Gout flares, joint pain   Other Other (See Comments)    Allergic to purines    Shellfish Allergy Other (See Comments)    MEDICATION LIST PRIOR TO VISIT: Current Meds  Medication Sig   aspirin 81 MG chewable tablet Chew 81 mg by mouth daily.   colchicine 0.6 MG tablet Take 0.6 mg by mouth daily. As needed for gout   diclofenac Sodium (VOLTAREN) 1 % GEL Apply topically.   DULoxetine HCl 40 MG CPEP 1 capsule Orally Once a day   hydrocortisone-pramoxine (PROCTOFOAM-HC) rectal foam INSERT 1 APPLICATORFUL RECTALLY TWICE A DAY AS NEEDED FOR HEMORRHOIDS   losartan-hydrochlorothiazide (HYZAAR) 100-25 MG tablet Take 1 tablet by mouth every morning.   meloxicam (MOBIC) 15 MG tablet TAKE 1 TABLET BY MOUTH EVERY DAY AS NEEDED FOR PAIN   Psyllium (METAMUCIL) 48.57 % POWD Take by mouth.   Rimegepant Sulfate (NURTEC) 75 MG TBDP Take by mouth.   rizatriptan (MAXALT) 10 MG tablet Take by mouth.   sildenafil (VIAGRA) 100 MG tablet Take 1 tablet (100 mg total) by mouth daily as needed for erectile dysfunction.   topiramate (TOPAMAX) 25 MG tablet Take 25 mg by mouth daily.   [DISCONTINUED] rosuvastatin (CRESTOR) 20 MG tablet TAKE 1 TABLET BY MOUTH EVERY DAY     PAST MEDICAL HISTORY: Past Medical History:  Diagnosis Date   Hyperlipidemia    Hypertension    Nonobstructive atherosclerosis of coronary artery    Sleep apnea    TIA (transient ischemic attack)  PAST SURGICAL HISTORY: Past Surgical History:  Procedure Laterality Date   COLONOSCOPY  2021    FAMILY HISTORY: The patient family history includes Diabetes in his mother, sister, and sister; Hypertension in his mother, sister, and sister; Stroke in his mother; Unexplained death in his father.  SOCIAL HISTORY:  The patient  reports that he has never smoked. He has never used smokeless tobacco. He reports that he does not currently use alcohol. He reports that he does not use drugs.  REVIEW OF  SYSTEMS: Review of Systems  HENT:  Positive for tinnitus.   Cardiovascular:  Negative for chest pain, claudication, dyspnea on exertion, irregular heartbeat, leg swelling, near-syncope, orthopnea, palpitations, paroxysmal nocturnal dyspnea and syncope.  Respiratory:  Negative for shortness of breath.   Hematologic/Lymphatic: Negative for bleeding problem.  Musculoskeletal:  Positive for back pain. Negative for muscle cramps and myalgias.  Neurological:  Positive for light-headedness. Negative for dizziness.       Migraines Incontinence to bowel and bladder, seeing a provider.    PHYSICAL EXAM:    02/06/2023   11:56 AM 02/06/2023   11:42 AM 08/01/2022    9:44 AM  Vitals with BMI  Height  6\' 0"    Weight  252 lbs 13 oz   BMI  34.28   Systolic 147 151 474  Diastolic 97 83 89  Pulse 64 65 61    Physical Exam  Constitutional: No distress. He appears chronically ill.  Age appropriate, hemodynamically stable, ambulates w/ cane.   Neck: No JVD present.  Cardiovascular: Normal rate, regular rhythm, S1 normal, S2 normal, intact distal pulses and normal pulses. Exam reveals no gallop, no S3 and no S4.  No murmur heard. Pulmonary/Chest: Effort normal and breath sounds normal. No stridor. He has no wheezes. He has no rales.  Abdominal: Soft. Bowel sounds are normal. He exhibits no distension. There is no abdominal tenderness.  Musculoskeletal:        General: No edema.     Cervical back: Neck supple.  Neurological: He is alert and oriented to person, place, and time. He has intact cranial nerves (2-12).  Skin: Skin is warm and moist.   CARDIAC DATABASE: EKG: 02/06/2023: Sinus rhythm, 64 bpm, without underlying ischemia or injury pattern  Echocardiogram: PCV ECHOCARDIOGRAM COMPLETE 11/28/2019  Narrative Echocardiogram 11/28/2019: Normal LV systolic function with visual EF 60-65%. Left ventricle cavity is normal in size. Mild to moderate hypertrophy of the left ventricle. Normal global  wall motion. Normal diastolic filling pattern, normal LAP. Calculated EF 59%. Mild pulmonic regurgitation. No prior study for comparison.   07/13/2022: [facility] Everywhere 1. Normal ventricular size and function; EF 55-60%.  2.  Normal LV diastolic function.  3.  Normal right ventricular size with mild reduction in function.  4.  No significant valvular abnormalities.   Stress Testing: PCV MYOCARDIAL PERFUSION WO LEXISCAN 12/18/2019  Narrative Exercise Sestamibi stress test 12/18/2019: Exercise nuclear stress test was performed using Bruce protocol. Patient reached 13.2 METS, and 88% of age predicted maximum heart rate. Exercise capacity was excellent. Chest pain not reported. Heart rate and hemodynamic response were normal. Stress EKG revealed no ischemic changes. Normal myocardial perfusion. Stress LVEF 53%. Low risk study.  Heart Catheterization: [facility] Everywhere: 07/12/2022 Hemodynamics: Aortic pressure 126/72, LVEDP 13  2.  Coronary system:   Left Main: Large caliber vessel with no angiographic evidence of  stenosis.   LAD system: Large caliber vessel, wraps around the apex.  Mild 20% disease distally otherwise no  significant stenosis   LCX system: Large caliber vessel.  No significant stenosis.   RCA system: right dominant.  20 to 30% distal otherwise no significant stenosis.   3. LV gram: EF 55 to 60% with normal wall motion.   Conclusion:  Mild non-obstructive CAD as mentioned above.  Normal LVEDP  Normal LV systolic function with normal wall motion  Acute coronary syndrome ruled out   Recommendations:  Continue risk factor modifications.    LABORATORY DATA: External Labs: Collected: September 07, 2022 available in Care Everywhere. Total cholesterol 154, triglycerides 111, HDL 44, LDL 88, non-HDL 110.  Collected: November 24, 2022 available in Care Everywhere A1c 6.1.  IMPRESSION:    ICD-10-CM   1. Nonobstructive atherosclerosis of coronary  artery  I25.10 EKG 12-Lead    2. Primary hypertension  I10     3. Mixed hyperlipidemia  E78.2     4. Sleep apnea in adult  G47.30     5. Hx of transient ischemic attack (TIA)  Z86.73     6. Hypercholesteremia  E78.00 rosuvastatin (CRESTOR) 20 MG tablet        RECOMMENDATIONS: William Savage is a 62 y.o. African-American male whose past medical history and cardiac risk factors include: Hypertension, hyperlipidemia, sleep apnea on CPAP, PTSD, mild nonobstructive CAD, obesity due to excess calories, Hx of TIA (around 2014),  prediabetes.  Mild Nonobstructive atherosclerosis of coronary artery 1 episode of chest pain since last office visit-not as severe as in January 2024 when he had gone to [facility] No reoccurrence of precordial pain. Continue aspirin and statin therapy. Reemphasized importance of blood pressure management.  BP elevated at today's visit due to pain and acute migraines flareup. Reemphasized the importance of secondary prevention with focus on improving her modifiable cardiovascular risk factors such as glycemic control, lipid management, blood pressure control, weight loss.  Primary hypertension Pressures are not at goal likely secondary to acute migraine flareup and back pain. Minipress is also been discontinued since last office visit-likely contributory as well. I have asked her to keep a log of his blood pressures and to review with PCP to see if additional medication titration is warranted.  Mixed hyperlipidemia Currently on rosuvastatin.   He denies myalgia or other side effects. The patient states that he has had recent lipids checked at the [facility] and/or at Dr. Rinaldo Cloud office.  I do not have these records available for review.  I will try my best to obtain a copy and I have asked the patient to do the same. Patient is requesting a refill on Crestor  Currently managed by primary care provider.  Sleep apnea in adult Reemphasized the importance of device  compliance.  Hx of transient ischemic attack (TIA) Continue aspirin and statin therapy. Emphasize importance secondary prevention.  Medication reconciliation is not complete. Patient also states that he was started on amlodipine in the interim.   MAR notes Viagra is one of the medications but patient states that he is taking Cialis as prescribed by his urologist.  I have asked him to call us back with an accurate medication list.  He is also aware of the contraindications between PDE 5 inhibitors and sublingual nitroglycerin tablets.  FINAL MEDICATION LIST END OF ENCOUNTER: Meds ordered this encounter  Medications   rosuvastatin (CRESTOR) 20 MG tablet    Sig: Take 1 tablet (20 mg total) by mouth daily.    Dispense:  90 tablet    Refill:  2    Medications Discontinued During  This Encounter  Medication Reason   rosuvastatin (CRESTOR) 20 MG tablet Reorder      Current Outpatient Medications:    aspirin 81 MG chewable tablet, Chew 81 mg by mouth daily., Disp: , Rfl:    colchicine 0.6 MG tablet, Take 0.6 mg by mouth daily. As needed for gout, Disp: , Rfl:    diclofenac Sodium (VOLTAREN) 1 % GEL, Apply topically., Disp: , Rfl:    DULoxetine HCl 40 MG CPEP, 1 capsule Orally Once a day, Disp: , Rfl:    hydrocortisone-pramoxine (PROCTOFOAM-HC) rectal foam, INSERT 1 APPLICATORFUL RECTALLY TWICE A DAY AS NEEDED FOR HEMORRHOIDS, Disp: , Rfl:    losartan-hydrochlorothiazide (HYZAAR) 100-25 MG tablet, Take 1 tablet by mouth every morning., Disp: 90 tablet, Rfl: 3   meloxicam (MOBIC) 15 MG tablet, TAKE 1 TABLET BY MOUTH EVERY DAY AS NEEDED FOR PAIN, Disp: 30 tablet, Rfl: 1   Psyllium (METAMUCIL) 48.57 % POWD, Take by mouth., Disp: , Rfl:    Rimegepant Sulfate (NURTEC) 75 MG TBDP, Take by mouth., Disp: , Rfl:    rizatriptan (MAXALT) 10 MG tablet, Take by mouth., Disp: , Rfl:    sildenafil (VIAGRA) 100 MG tablet, Take 1 tablet (100 mg total) by mouth daily as needed for erectile dysfunction., Disp:  20 tablet, Rfl: 3   topiramate (TOPAMAX) 25 MG tablet, Take 25 mg by mouth daily., Disp: , Rfl:    nitroGLYCERIN (NITROSTAT) 0.4 MG SL tablet, Place 1 tablet (0.4 mg total) under the tongue every 5 (five) minutes as needed for up to 25 days for chest pain., Disp: 25 tablet, Rfl: 3   rosuvastatin (CRESTOR) 20 MG tablet, Take 1 tablet (20 mg total) by mouth daily., Disp: 90 tablet, Rfl: 2  Orders Placed This Encounter  Procedures   EKG 12-Lead    There are no Patient Instructions on file for this visit.   --Continue cardiac medications as reconciled in final medication list. --Return in about 1 year (around 02/06/2024) for Follow up  mild non-obstructive CAD . or sooner if needed. --Continue follow-up with your primary care physician regarding the management of your other chronic comorbid conditions.  Patient's questions and concerns were addressed to his satisfaction. He voices understanding of the instructions provided during this encounter.   This note was created using a voice recognition software as a result there may be grammatical errors inadvertently enclosed that do not reflect the nature of this encounter. Every attempt is made to correct such errors.  Tessa Lerner, [place], [facility]  Pager:  [phone] Office: [phone]

## 2023-02-08 ENCOUNTER — Ambulatory Visit (HOSPITAL_BASED_OUTPATIENT_CLINIC_OR_DEPARTMENT_OTHER): Payer: No Typology Code available for payment source | Attending: Endocrinology | Admitting: Physical Therapy

## 2023-02-08 ENCOUNTER — Encounter (HOSPITAL_BASED_OUTPATIENT_CLINIC_OR_DEPARTMENT_OTHER): Payer: Self-pay | Admitting: Physical Therapy

## 2023-02-08 ENCOUNTER — Other Ambulatory Visit: Payer: Self-pay

## 2023-02-08 DIAGNOSIS — R2689 Other abnormalities of gait and mobility: Secondary | ICD-10-CM | POA: Insufficient documentation

## 2023-02-08 DIAGNOSIS — M25561 Pain in right knee: Secondary | ICD-10-CM | POA: Diagnosis present

## 2023-02-08 DIAGNOSIS — G894 Chronic pain syndrome: Secondary | ICD-10-CM | POA: Diagnosis present

## 2023-02-08 DIAGNOSIS — G8929 Other chronic pain: Secondary | ICD-10-CM | POA: Diagnosis present

## 2023-02-08 DIAGNOSIS — M5459 Other low back pain: Secondary | ICD-10-CM | POA: Insufficient documentation

## 2023-02-08 DIAGNOSIS — M6281 Muscle weakness (generalized): Secondary | ICD-10-CM | POA: Diagnosis present

## 2023-02-08 DIAGNOSIS — M542 Cervicalgia: Secondary | ICD-10-CM | POA: Diagnosis not present

## 2023-02-08 DIAGNOSIS — M25562 Pain in left knee: Secondary | ICD-10-CM | POA: Insufficient documentation

## 2023-02-08 NOTE — Therapy (Signed)
OUTPATIENT PHYSICAL THERAPY LOWER EXTREMITY EVALUATION   Patient Name: William Savage MRN: [id number] DOB:[birth year], 62 y.o., male Today's Date: 02/08/2023  END OF SESSION:  PT End of Session - 02/08/23 1154     Visit Number 1    Number of Visits 15    Date for PT Re-Evaluation 04/21/23    Authorization Type Department of [facility]  15 visits approved from   12/24/22-04/23/23  [id number]  [facility]: [id number] [name]    PT Start Time 0820    PT Stop Time 0900    PT Time Calculation (min) 40 min    Activity Tolerance Patient limited by pain;Patient tolerated treatment well    Behavior During Therapy [facility] for tasks assessed/performed             Past Medical History:  Diagnosis Date   Hyperlipidemia    Hypertension    Nonobstructive atherosclerosis of coronary artery    Sleep apnea    TIA (transient ischemic attack)    Past Surgical History:  Procedure Laterality Date   COLONOSCOPY  2021   There are no problems to display for this patient.   PCP: none  REFERRING PROVIDER: Vivi Barrack, DPM / Darlyn Chamber MD  REFERRING DIAG: - Chronic pain of both knees ; Chronic pain syndrome   THERAPY DIAG:  Chronic pain of both knees  Other low back pain  Muscle weakness-general  Other abnormalities of gait and mobility  Chronic pain syndrome  Rationale for Evaluation and Treatment: Rehabilitation  ONSET DATE: >10 yrs  SUBJECTIVE:   SUBJECTIVE STATEMENT: [place].  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  Back, neck and knee pain are bothering me today.  Nighttime back and knees hurt keeping me from sleeping they jump around.  Ankle and feet are not flared today.  Using rollator x 1 month.  Fell in may.  Left ankle gave out, many near falls when my knee buckles.  Walking better with rollator.  Ankles were severly sprained in the millitary.  Went to therapy in [place] back in May but only did about 4 sessions due to lack of  toleration. Have gout in left foot slightly flared.  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 Radicualr pain in right hand 4th and 5th digits.   PERTINENT HISTORY: 01/16/23 referral from [id number] pain of both knees  Department of [facility] 15 visits approved from  12/24/22-04/23/23 [id number] [facility]: [phone] [name]  Referring Provider: Darlyn Chamber   01/03/23 referral from [facility]:IEPPIRJ pain syndrome   11/15/22:ankle instability, plantar fasciitis, chronic foot pain  PAIN:  Are you having pain? Yes: NPRS scale: current 8/10 Pain location: LB/neck Pain description: radiating, tingling, achy, dull, stiffness Aggravating factors: movement, sitting x 15 mins, standing/walking 10 mins; driving Relieving factors: sleep, medication, massage(monthly)   L knee>right 5/10 Ankles 4/10  PRECAUTIONS: Fall  RED FLAGS: None   WEIGHT BEARING RESTRICTIONS: No  FALLS:  Has patient fallen in last 6 months? Yes. Number of falls 1 left ankle gave out  LIVING ENVIRONMENT: Lives with: lives with their spouse Lives in: House/apartment Stairs: Yes: Internal: 16 steps; on right going up Has following equipment at home: Single point cane, Walker - 4 wheeled, and shower chair  OCCUPATION: disabled  PLOF: Requires assistive device for independence  PATIENT GOALS: less pain, get stronger, walk with AD  NEXT MD VISIT: as needed  OBJECTIVE:   DIAGNOSTIC FINDINGS:  01/25/23: AP lateral merchant radiographs left knee reviewed.  Medial compartment  arthritis which is severe is present.  Bone-on-bone changes noted with  some spurring on the medial side.  Lateral patellofemoral compartments  have less significant degenerative changes present.  Alignment is slight  varus.   AP lateral merchant radiographs right knee reviewed.  Alignment intact.   Mild medial joint space narrowing is present.  No spurs in the medial  lateral or patellofemoral compartments.    AP lateral radiographs lumbar spine reviewed.  Grade 1 spondylolisthesis  at L4-5 is present with significant degenerative disc disease at L5-S1.   Facet arthritis also pronounced at these levels.  No compression fracture.   X-ray cervical spine: neg  PATIENT SURVEYS:  FOTO primary score 15% with goal of 37%  COGNITION: Overall cognitive status: Within functional limits for tasks assessed     SENSATION: N&T 4th and 5th fingers R/L    MUSCLE LENGTH: Hamstrings: limited bilaterally   POSTURE: decreased lumbar lordosis  PALPATION: TTP throughout upper and lower back, glutes and with patellar mobs.  LOWER EXTREMITY ROM:   All movements limited by pain Active ROM Right eval Left eval  Hip flexion    Hip extension    Hip abduction    Hip adduction    Hip internal rotation    Hip external rotation    Knee flexion 86 90  Knee extension 30 35  Ankle dorsiflexion 0 0  Ankle plantarflexion    Ankle inversion    Ankle eversion     (Blank rows = not tested)  LOWER EXTREMITY MMT:  MMT Right eval Left eval  Hip flexion 17.7 16.2  Hip extension    Hip abduction 14.0 22.7  Hip adduction    Hip internal rotation    Hip external rotation    Knee flexion    Knee extension 15.4 10.5  Ankle dorsiflexion    Ankle plantarflexion    Ankle inversion    Ankle eversion     (Blank rows = not tested)  LOWER EXTREMITY SPECIAL TESTS:  Unable to toelrate  FUNCTIONAL TESTS:  5 times sit to stand: not tolerated Timed up and go (TUG): 34.33 3 minute walk test: ran out of time.  Staged balance:0/4 GAIT: Distance walked: 554ft Assistive device utilized: Environmental consultant - 4 wheeled Level of assistance: Complete Independence Comments: rollator, decreased hip/knee flex, just clearing feet from floor, minimal heel strike and toe off, Posture guarded/antalgic    TODAY'S TREATMENT:                                                                                                                               Eval Functional testing Foto  PATIENT EDUCATION:  Education details: Discussed eval findings, rehab rationale, aquatic program progression/POC and pools in area. Patient is in agreement   Person educated: Patient Education method: Explanation Education comprehension: verbalized understanding  HOME EXERCISE PROGRAM: TBA  ASSESSMENT:  CLINICAL IMPRESSION: Patient  is a 62 y.o. m who was seen today for physical therapy evaluation and treatment for Chronic pain of both knees. He has another referral for chronic pain syndrome received 2 weeks prior to knee pain referral and had been seen in May for ankle instability, plantar fasciitis, chronic foot pain at another clinic. Pt is a retired Arts development officer. Went into [employer] which he ended up retiring due to becoming disabled.  He presents with general pain throughout entire spine; cervical with radicular pattern into ue to 4-5 digits; lumbar spine with discomfort into bilateral glutes/hips; bilateral knee pain due to OA; bilaterally plantar fascitis and left ankle derangement (due to repeated falls). He is presently using a rollator for amb at all times in the past month which he reports has greatly increased his stability. He has had Falls and near falls due to left ankle and knee "giving way".  Today he reports neck, lb and knee pain are most limiting.  Flares particularly at night decreasing his ability to sleep. He is very highly pain sensitive, has significant strength deficits and poor balance.  He will benefit from skilled physical therapy with intervention aquatic based for at least the first certification with goals to decrease pain allowing for iproved movement patterns and strength.  Will transition to land based when approp.  OBJECTIVE IMPAIRMENTS: Abnormal gait, decreased activity tolerance, decreased balance, decreased endurance, decreased mobility, difficulty walking, decreased ROM, decreased strength, impaired flexibility,  impaired sensation, improper body mechanics, and pain.   ACTIVITY LIMITATIONS: carrying, lifting, bending, sitting, standing, squatting, stairs, transfers, bed mobility, continence, bathing, toileting, and locomotion level  PARTICIPATION LIMITATIONS: meal prep, cleaning, laundry, driving, shopping, community activity, occupation, and yard work  PERSONAL FACTORS: Age, Fitness, Profession, Time since onset of injury/illness/exacerbation, and multiple orthopedic/LB dysfunctions  are also affecting patient's functional outcome.   REHAB POTENTIAL: Good  CLINICAL DECISION MAKING: Unstable/unpredictable  EVALUATION COMPLEXITY: High   GOALS: Goals reviewed with patient? Yes  SHORT TERM GOALS: Target date: 04/14/23 Pt will tolerate full aquatic sessions consistently without increase in pain and with improving function to demonstrate good toleration and effectiveness of intervention.  Baseline: Goal status: INITIAL  2.  Pt will tolerate walking to or from setting along with tolerating full aquatic session without significnat increase in pain or fatigue Baseline: requires wc Goal status: INITIAL  3.  Pt will complete SLS in 3.6 ft holding R/L x 20s to demonstrate improving balance Baseline: unable to complete Land based Goal status: INITIAL  4.  Pt will complete STS from bench onto pool floor x 10 continuously with ue support on foam and gaining immediate standing balance indep. Baseline:  Goal status: INITIAL  5.  Pt will improve on Tug test to <or=  25s to demonstrate improvement in lower extremity function, mobility and decreased fall risk. Baseline: 34s Goal status: INITIAL  6.  Pt will report fewer waking episodes nightly due to pain to demonstrate improved management of chronic pain Baseline: >5x nightly Goal status: INITIAL  LONG TERM GOALS: Target date: 04/21/23  Pt to meet stated Foto Goal of 37% to demonstrate improved functional status Baseline: 15% Goal status:  INITIAL  2.  Pt will improve strength in all areas listed by at least 20lbs to demonstrate improved overall physical function Baseline: see chart Goal status: INITIAL  3.  Pt will amb up to or > 250 ft in 3 minute walk test to demonstrate improved toleration to amb Baseline: pending completion Goal status: INITIAL  4.  Pt  will report decrease in worst pain to </= 7/10 for improved toleration to activity/quality of life and to demonstrate improved management of pain.  Baseline:  Goal status: INITIAL  5.  Pt will improve in bilat knee ROM by at least 10d in both flex and extension Baseline:  Goal status: INITIAL     PLAN:  PT FREQUENCY: 1-2x/week  PT DURATION: 10 weeks 15 visits per [place]  PLANNED INTERVENTIONS: Therapeutic exercises, Therapeutic activity, Neuromuscular re-education, Balance training, Gait training, Patient/Family education, Self Care, Joint mobilization, Stair training, Orthotic/Fit training, DME instructions, Aquatic Therapy, Dry Needling, Electrical stimulation, Cryotherapy, Moist heat, Taping, Ionotophoresis 4mg /ml Dexamethasone, Manual therapy, and Re-evaluation  PLAN FOR NEXT SESSION: Aquatic: strength, gait, stair climbing, STS, balance retraining, toleration to activity   Corrie Dandy Tomma Lightning) Brayon B[place] MPT 02/08/2023, 6:31 PM

## 2023-02-16 ENCOUNTER — Ambulatory Visit (HOSPITAL_BASED_OUTPATIENT_CLINIC_OR_DEPARTMENT_OTHER): Payer: No Typology Code available for payment source | Admitting: Physical Therapy

## 2023-02-16 ENCOUNTER — Telehealth (HOSPITAL_BASED_OUTPATIENT_CLINIC_OR_DEPARTMENT_OTHER): Payer: Self-pay | Admitting: Physical Therapy

## 2023-02-16 NOTE — Telephone Encounter (Signed)
Spoke with pt.  He reports forgetting about aquatic appointment.  He is reminded of the late cancel/No show policy.  Next visit confirmed.

## 2023-02-22 DIAGNOSIS — F4310 Post-traumatic stress disorder, unspecified: Secondary | ICD-10-CM | POA: Diagnosis not present

## 2023-02-22 DIAGNOSIS — G4700 Insomnia, unspecified: Secondary | ICD-10-CM | POA: Diagnosis not present

## 2023-02-22 DIAGNOSIS — F4312 Post-traumatic stress disorder, chronic: Secondary | ICD-10-CM | POA: Diagnosis not present

## 2023-02-22 DIAGNOSIS — F331 Major depressive disorder, recurrent, moderate: Secondary | ICD-10-CM | POA: Diagnosis not present

## 2023-02-22 DIAGNOSIS — F419 Anxiety disorder, unspecified: Secondary | ICD-10-CM | POA: Diagnosis not present

## 2023-02-27 ENCOUNTER — Encounter (HOSPITAL_BASED_OUTPATIENT_CLINIC_OR_DEPARTMENT_OTHER): Payer: Self-pay | Admitting: Physical Therapy

## 2023-02-27 ENCOUNTER — Ambulatory Visit (HOSPITAL_BASED_OUTPATIENT_CLINIC_OR_DEPARTMENT_OTHER): Payer: No Typology Code available for payment source | Attending: Endocrinology | Admitting: Physical Therapy

## 2023-02-27 DIAGNOSIS — G8929 Other chronic pain: Secondary | ICD-10-CM | POA: Insufficient documentation

## 2023-02-27 DIAGNOSIS — M5459 Other low back pain: Secondary | ICD-10-CM | POA: Insufficient documentation

## 2023-02-27 DIAGNOSIS — M25561 Pain in right knee: Secondary | ICD-10-CM | POA: Insufficient documentation

## 2023-02-27 DIAGNOSIS — M25562 Pain in left knee: Secondary | ICD-10-CM | POA: Insufficient documentation

## 2023-02-27 DIAGNOSIS — M6281 Muscle weakness (generalized): Secondary | ICD-10-CM | POA: Diagnosis present

## 2023-02-27 DIAGNOSIS — R2689 Other abnormalities of gait and mobility: Secondary | ICD-10-CM | POA: Diagnosis present

## 2023-02-27 NOTE — Therapy (Signed)
OUTPATIENT PHYSICAL THERAPY LOWER EXTREMITY EVALUATION   Patient Name: William Savage MRN: [id number] DOB:02/22/1961, 62 y.o., male Today's Date: 02/27/2023  END OF SESSION:  PT End of Session - 02/27/23 1558     Visit Number 2    Number of Visits 15    Date for PT Re-Evaluation 04/21/23    Authorization Type Department of [facility]  15 visits approved from   12/24/22-04/23/23  [id number]  [facility]: [id number] William Savage    PT Start Time 1550    PT Stop Time 1635    PT Time Calculation (min) 45 min    Activity Tolerance Patient limited by pain;Patient tolerated treatment well    Behavior During Therapy [facility] for tasks assessed/performed             Past Medical History:  Diagnosis Date   Hyperlipidemia    Hypertension    Nonobstructive atherosclerosis of coronary artery    Sleep apnea    TIA (transient ischemic attack)    Past Surgical History:  Procedure Laterality Date   COLONOSCOPY  2021   There are no problems to display for this patient.   PCP: none  REFERRING PROVIDER: Vivi Barrack, DPM / Darlyn Chamber MD  REFERRING DIAG: - Chronic pain of both knees ; Chronic pain syndrome   THERAPY DIAG:  Chronic pain of both knees  Other low back pain  Muscle weakness-general  Other abnormalities of gait and mobility  Rationale for Evaluation and Treatment: Rehabilitation  ONSET DATE: >10 yrs  SUBJECTIVE:   SUBJECTIVE STATEMENT: Left knee 8/10; lb 7/10. "My left shoulder is hurting as well today."  [place] in [place].  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  Back, neck and knee pain are bothering me today.  Nighttime back and knees hurt keeping me from sleeping they jump around.  Ankle and feet are not flared today.  Using rollator x 1 month.  Fell in may.  Left ankle gave out, many near falls when my knee buckles.  Walking better with rollator.  Ankles were severly sprained in the millitary.  Went to therapy in [place] back in May but  only did about 4 sessions due to lack of toleration. Have gout in left foot slightly flared.  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 Radicualr pain in right hand 4th and 5th digits.   PERTINENT HISTORY: 01/16/23 referral from MW:[email or web address] pain of both knees  Department of [facility] 15 visits approved from  12/24/22-04/23/23 [id number] [facility]: [phone] William Savage  Referring Provider: Darlyn Chamber   01/03/23 referral from OV:FIEPPIR pain syndrome   11/15/22:ankle instability, plantar fasciitis, chronic foot pain  PAIN:  Are you having pain? Yes: NPRS scale: current 8/10 Pain location: LB/neck Pain description: radiating, tingling, achy, dull, stiffness Aggravating factors: movement, sitting x 15 mins, standing/walking 10 mins; driving Relieving factors: sleep, medication, massage(monthly)   L knee>right 5/10 Ankles 4/10  PRECAUTIONS: Fall  RED FLAGS: None   WEIGHT BEARING RESTRICTIONS: No  FALLS:  Has patient fallen in last 6 months? Yes. Number of falls 1 left ankle gave out  LIVING ENVIRONMENT: Lives with: lives with their spouse Lives in: House/apartment Stairs: Yes: Internal: 16 steps; on right going up Has following equipment at home: Single point cane, Walker - 4 wheeled, and shower chair  OCCUPATION: disabled  PLOF: Requires assistive device for independence  PATIENT GOALS: less pain, get stronger, walk with AD  NEXT  MD VISIT: as needed  OBJECTIVE:   DIAGNOSTIC FINDINGS:  01/25/23: AP lateral merchant radiographs left knee reviewed.  Medial compartment  arthritis which is severe is present.  Bone-on-bone changes noted with  some spurring on the medial side.  Lateral patellofemoral compartments  have less significant degenerative changes present.  Alignment is slight  varus.   AP lateral merchant radiographs right knee reviewed.  Alignment intact.   Mild medial joint space narrowing is present.  No spurs in the medial   lateral or patellofemoral compartments.   AP lateral radiographs lumbar spine reviewed.  Grade 1 spondylolisthesis  at L4-5 is present with significant degenerative disc disease at L5-S1.   Facet arthritis also pronounced at these levels.  No compression fracture.   X-ray cervical spine: neg  PATIENT SURVEYS:  FOTO primary score 15% with goal of 37%  COGNITION: Overall cognitive status: Within functional limits for tasks assessed     SENSATION: N&T 4th and 5th fingers R/L    MUSCLE LENGTH: Hamstrings: limited bilaterally   POSTURE: decreased lumbar lordosis  PALPATION: TTP throughout upper and lower back, glutes and with patellar mobs.  LOWER EXTREMITY ROM:   All movements limited by pain Active ROM Right eval Left eval  Hip flexion    Hip extension    Hip abduction    Hip adduction    Hip internal rotation    Hip external rotation    Knee flexion 86 90  Knee extension 30 35  Ankle dorsiflexion 0 0  Ankle plantarflexion    Ankle inversion    Ankle eversion     (Blank rows = not tested)  LOWER EXTREMITY MMT:  MMT Right eval Left eval  Hip flexion 17.7 16.2  Hip extension    Hip abduction 14.0 22.7  Hip adduction    Hip internal rotation    Hip external rotation    Knee flexion    Knee extension 15.4 10.5  Ankle dorsiflexion    Ankle plantarflexion    Ankle inversion    Ankle eversion     (Blank rows = not tested)  LOWER EXTREMITY SPECIAL TESTS:  Unable to toelrate  FUNCTIONAL TESTS:  5 times sit to stand: not tolerated Timed up and go (TUG): 34.33 3 minute walk test: ran out of time.  Staged balance:0/4 GAIT: Distance walked: 541ft Assistive device utilized: Environmental consultant - 4 wheeled Level of assistance: Complete Independence Comments: rollator, decreased hip/knee flex, just clearing feet from floor, minimal heel strike and toe off, Posture guarded/antalgic    TODAY'S TREATMENT:                                                                                                                               Pt seen for aquatic therapy today.  Treatment took place in water 3.5-4.75 ft in depth at the [facility] pool. Temp of water was 91.  Pt entered/exited the pool via stairs and step to pattern with  bilateral hand rail.   *intro to setting *Ue support barbell walking forward and back 4.8 ft 3 widths ea; side stepping x 2 widths *squated recovery period *standing: relaxed squats; hip add/abd; high knee marching *squating recovery *decompression using noodle *Bow&Arrow *Cycling on noodle (difficult for pt to gain balance) improve with time *TrA set using rainbow HB -> yellow 2 widths forward and backward. Cues for knee flex, heel strike. *Seated on lift: cycling; hip add/abd; LAQ  Pt requires the buoyancy and hydrostatic pressure of water for support, and to offload joints by unweighting joint load by at least 50 % in navel deep water and by at least 75-80% in chest to neck deep water.  Viscosity of the water is needed for resistance of strengthening. Water current perturbations provides challenge to standing balance requiring increased core activation.   PATIENT EDUCATION:  Education details: Discussed eval findings, rehab rationale, aquatic program progression/POC and pools in area. Patient is in agreement   Person educated: Patient Education method: Explanation Education comprehension: verbalized understanding  HOME EXERCISE PROGRAM: TBA  ASSESSMENT:  CLINICAL IMPRESSION: Pt returns for initial aquatic session with high pain snesitivity in left knee, LB and left shoulder. He demonstrates safety and indep in setting with therapist instructed from deck. Focus today on gentle movement patterns for pain relief, and assessing balance and toleration to/in setting. He requires cues to decrease guarded postioning at knees and shoulders and frequent recovery periods.  Upon completion pt reports knees and LB with reduced  discomfort but cervical spine and should with slight increase. He is experiencing increased left knee prepatellar edema. Reports for past 10 days.  He does report having gout.  Uncertain if related.  Goals ongoing   Initial Impresson Patient is a 62 y.o. m who was seen today for physical therapy evaluation and treatment for Chronic pain of both knees. He has another referral for chronic pain syndrome received 2 weeks prior to knee pain referral and had been seen in May for ankle instability, plantar fasciitis, chronic foot pain at another clinic. Pt is a retired Arts development officer. Went into [employer] which he ended up retiring due to becoming disabled.  He presents with general pain throughout entire spine; cervical with radicular pattern into ue to 4-5 digits; lumbar spine with discomfort into bilateral glutes/hips; bilateral knee pain due to OA; bilaterally plantar fascitis and left ankle derangement (due to repeated falls). He is presently using a rollator for amb at all times in the past month which he reports has greatly increased his stability. He has had Falls and near falls due to left ankle and knee "giving way".  Today he reports neck, lb and knee pain are most limiting.  Flares particularly at night decreasing his ability to sleep. He is very highly pain sensitive, has significant strength deficits and poor balance.  He will benefit from skilled physical therapy with intervention aquatic based for at least the first certification with goals to decrease pain allowing for iproved movement patterns and strength.  Will transition to land based when approp.  OBJECTIVE IMPAIRMENTS: Abnormal gait, decreased activity tolerance, decreased balance, decreased endurance, decreased mobility, difficulty walking, decreased ROM, decreased strength, impaired flexibility, impaired sensation, improper body mechanics, and pain.   ACTIVITY LIMITATIONS: carrying, lifting, bending, sitting, standing, squatting, stairs,  transfers, bed mobility, continence, bathing, toileting, and locomotion level  PARTICIPATION LIMITATIONS: meal prep, cleaning, laundry, driving, shopping, community activity, occupation, and yard work  PERSONAL FACTORS: Age, Fitness, Profession, Time since onset of injury/illness/exacerbation, and  multiple orthopedic/LB dysfunctions  are also affecting patient's functional outcome.   REHAB POTENTIAL: Good  CLINICAL DECISION MAKING: Unstable/unpredictable  EVALUATION COMPLEXITY: High   GOALS: Goals reviewed with patient? Yes  SHORT TERM GOALS: Target date: 04/14/23 Pt will tolerate full aquatic sessions consistently without increase in pain and with improving function to demonstrate good toleration and effectiveness of intervention.  Baseline: Goal status: INITIAL  2.  Pt will tolerate walking to or from setting along with tolerating full aquatic session without significnat increase in pain or fatigue Baseline: requires wc Goal status: INITIAL  3.  Pt will complete SLS in 3.6 ft holding R/L x 20s to demonstrate improving balance Baseline: unable to complete Land based Goal status: INITIAL  4.  Pt will complete STS from bench onto pool floor x 10 continuously with ue support on foam and gaining immediate standing balance indep. Baseline:  Goal status: INITIAL  5.  Pt will improve on Tug test to <or=  25s to demonstrate improvement in lower extremity function, mobility and decreased fall risk. Baseline: 34s Goal status: INITIAL  6.  Pt will report fewer waking episodes nightly due to pain to demonstrate improved management of chronic pain Baseline: >5x nightly Goal status: INITIAL  LONG TERM GOALS: Target date: 04/21/23  Pt to meet stated Foto Goal of 37% to demonstrate improved functional status Baseline: 15% Goal status: INITIAL  2.  Pt will improve strength in all areas listed by at least 20lbs to demonstrate improved overall physical function Baseline: see chart Goal  status: INITIAL  3.  Pt will amb up to or > 250 ft in 3 minute walk test to demonstrate improved toleration to amb Baseline: pending completion Goal status: INITIAL  4.  Pt will report decrease in worst pain to </= 7/10 for improved toleration to activity/quality of life and to demonstrate improved management of pain.  Baseline:  Goal status: INITIAL  5.  Pt will improve in bilat knee ROM by at least 10d in both flex and extension Baseline:  Goal status: INITIAL     PLAN:  PT FREQUENCY: 1-2x/week  PT DURATION: 10 weeks 15 visits per [place]  PLANNED INTERVENTIONS: Therapeutic exercises, Therapeutic activity, Neuromuscular re-education, Balance training, Gait training, Patient/Family education, Self Care, Joint mobilization, Stair training, Orthotic/Fit training, DME instructions, Aquatic Therapy, Dry Needling, Electrical stimulation, Cryotherapy, Moist heat, Taping, Ionotophoresis 4mg /ml Dexamethasone, Manual therapy, and Re-evaluation  PLAN FOR NEXT SESSION: Aquatic: strength, gait, stair climbing, STS, balance retraining, toleration to activity   Corrie Dandy (Frankie) Breezy Hertenstein MPT 02/27/2023, 5:12 PM

## 2023-03-02 ENCOUNTER — Encounter (HOSPITAL_BASED_OUTPATIENT_CLINIC_OR_DEPARTMENT_OTHER): Payer: Self-pay | Admitting: Physical Therapy

## 2023-03-02 ENCOUNTER — Ambulatory Visit (HOSPITAL_BASED_OUTPATIENT_CLINIC_OR_DEPARTMENT_OTHER): Payer: No Typology Code available for payment source | Admitting: Physical Therapy

## 2023-03-02 DIAGNOSIS — M5459 Other low back pain: Secondary | ICD-10-CM

## 2023-03-02 DIAGNOSIS — G8929 Other chronic pain: Secondary | ICD-10-CM

## 2023-03-02 DIAGNOSIS — M6281 Muscle weakness (generalized): Secondary | ICD-10-CM

## 2023-03-02 DIAGNOSIS — M25561 Pain in right knee: Secondary | ICD-10-CM | POA: Diagnosis not present

## 2023-03-02 NOTE — Therapy (Signed)
OUTPATIENT PHYSICAL THERAPY LOWER EXTREMITY EVALUATION   Patient Name: William Savage MRN: [id number] DOB:April 27, 1961, 62 y.o., male Today's Date: 03/02/2023  END OF SESSION:  PT End of Session - 03/02/23 0903     Visit Number 3    Number of Visits 15    Date for PT Re-Evaluation 04/21/23    Authorization Type Department of [facility]  15 visits approved from   12/24/22-04/23/23  [id number]  [facility]: [phone] William Savage    PT Start Time 0900    PT Stop Time 0945    PT Time Calculation (min) 45 min    Activity Tolerance Patient tolerated treatment well;Patient limited by pain    Behavior During Therapy [facility] for tasks assessed/performed             Past Medical History:  Diagnosis Date   Hyperlipidemia    Hypertension    Nonobstructive atherosclerosis of coronary artery    Sleep apnea    TIA (transient ischemic attack)    Past Surgical History:  Procedure Laterality Date   COLONOSCOPY  2021   There are no problems to display for this patient.   PCP: none  REFERRING PROVIDER: Vivi Savage, William Savage / William Chamber William Savage  REFERRING DIAG: - Chronic pain of both knees ; Chronic pain syndrome   THERAPY DIAG:  Chronic pain of both knees  Other low back pain  Muscle weakness-general  Rationale for Evaluation and Treatment: Rehabilitation  ONSET DATE: >10 yrs  SUBJECTIVE:   SUBJECTIVE STATEMENT: Left knee 8/10 but relief from pain for a few days after last session to about a 6/10.  Pt has questions regarding kmee injections.  Sees orthopod next week.  Many William Savage in [place] and lay.  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  Back, neck and knee pain are bothering me today.  Nighttime back and knees hurt keeping me from sleeping they jump around.  Ankle and feet are not flared today.  Using rollator x 1 month.  Fell in may.  Left ankle gave out, many near falls when my knee buckles.  Walking better with rollator.  Ankles were severly sprained in the millitary.   Went to therapy in [place] back in May but only did about 4 sessions due to lack of toleration. Have gout in left foot slightly flared.  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 Radicualr pain in right hand 4th and 5th digits.   PERTINENT HISTORY: 01/16/23 referral from William Savage pain of both knees  Department of [facility] 15 visits approved from  12/24/22-04/23/23 [id number] [facility]: [id number] William Savage  Referring Provider: Darlyn Savage   01/03/23 referral from William Savage pain syndrome   11/15/22:ankle instability, plantar fasciitis, chronic foot pain  PAIN:  Are you having pain? Yes: NPRS scale: current 8/10 Pain location: LB/neck Pain description: radiating, tingling, achy, dull, stiffness Aggravating factors: movement, sitting x 15 mins, standing/walking 10 mins; driving Relieving factors: sleep, medication, massage(monthly)   L knee>right 5/10 Ankles 4/10  PRECAUTIONS: Fall  RED FLAGS: None   WEIGHT BEARING RESTRICTIONS: No  FALLS:  Has patient fallen in last 6 months? Yes. Number of falls 1 left ankle gave out  LIVING ENVIRONMENT: Lives with: lives with their spouse Lives in: House/apartment Stairs: Yes: Internal: 16 steps; on right going up Has following equipment at home: Single point cane, Walker - 4 wheeled, and shower chair  OCCUPATION: disabled  PLOF: Requires assistive device for independence  PATIENT  GOALS: less pain, get stronger, walk with AD  NEXT William Savage VISIT: as needed  OBJECTIVE:   DIAGNOSTIC FINDINGS:  01/25/23: AP lateral merchant radiographs left knee reviewed.  Medial compartment  arthritis which is severe is present.  Bone-on-bone changes noted with  some spurring on the medial side.  Lateral patellofemoral compartments  have less significant degenerative changes present.  Alignment is slight  varus.   AP lateral merchant radiographs right knee reviewed.  Alignment intact.   Mild medial joint space  narrowing is present.  No spurs in the medial  lateral or patellofemoral compartments.   AP lateral radiographs lumbar spine reviewed.  Grade 1 spondylolisthesis  at L4-5 is present with significant degenerative disc disease at L5-S1.   Facet arthritis also pronounced at these levels.  No compression fracture.   X-ray cervical spine: neg  PATIENT SURVEYS:  FOTO primary score 15% with goal of 37%  COGNITION: Overall cognitive status: Within functional limits for tasks assessed     SENSATION: N&T 4th and 5th fingers R/L    MUSCLE LENGTH: Hamstrings: limited bilaterally   POSTURE: decreased lumbar lordosis  PALPATION: TTP throughout upper and lower back, glutes and with patellar mobs.  LOWER EXTREMITY ROM:   All movements limited by pain Active ROM Right eval Left eval  Hip flexion    Hip extension    Hip abduction    Hip adduction    Hip internal rotation    Hip external rotation    Knee flexion 86 90  Knee extension 30 35  Ankle dorsiflexion 0 0  Ankle plantarflexion    Ankle inversion    Ankle eversion     (Blank rows = not tested)  LOWER EXTREMITY MMT:  MMT Right eval Left eval  Hip flexion 17.7 16.2  Hip extension    Hip abduction 14.0 22.7  Hip adduction    Hip internal rotation    Hip external rotation    Knee flexion    Knee extension 15.4 10.5  Ankle dorsiflexion    Ankle plantarflexion    Ankle inversion    Ankle eversion     (Blank rows = not tested)  LOWER EXTREMITY SPECIAL TESTS:  Unable to toelrate  FUNCTIONAL TESTS:  5 times sit to stand: not tolerated Timed up and go (TUG): 34.33 3 minute walk test: ran out of time.  Staged balance:0/4 GAIT: Distance walked: 571ft Assistive device utilized: Environmental consultant - 4 wheeled Level of assistance: Complete Independence Comments: rollator, decreased hip/knee flex, just clearing feet from floor, minimal heel strike and toe off, Posture guarded/antalgic    TODAY'S TREATMENT:                                                                                                                               Pt seen for aquatic therapy today.  Treatment took place in water 3.5-4.75 ft in depth at the [facility] pool. Temp of water was 91.  Pt  entered/exited the pool via stairs and step to pattern with bilateral hand rail.   *Ue support barbell walking forward, back and side stepping 4.8 ft 3 widths ea; side stepping x widths *standing unsupported 4.8 ft: relaxed squats; hip add/abd; high knee marching; hamstring curls x10-12 *Seated on lift: cycling; hip add/abd; LAQ *attempted l stretch at wall but increased left knee pain *Hip hinge x 5-6.  Improved toleration for LB stretch  *Decompression using yellow noodle *Cycling on noodle (difficult for pt to gain balance) improve with time   Pt requires the buoyancy and hydrostatic pressure of water for support, and to offload joints by unweighting joint load by at least 50 % in navel deep water and by at least 75-80% in chest to neck deep water.  Viscosity of the water is needed for resistance of strengthening. Water current perturbations provides challenge to standing balance requiring increased core activation.   PATIENT EDUCATION:  Education details: Discussed eval findings, rehab rationale, aquatic program progression/POC and pools in area. Patient is in agreement   Person educated: Patient Education method: Explanation Education comprehension: verbalized understanding  HOME EXERCISE PROGRAM: TBA  ASSESSMENT:  CLINICAL IMPRESSION: Pt has limited toleration with SLS lle due to knee pain throughout completing SL exercises. Progressed to less ue support increasing balance challenge but also to relieve hand numbness due .  Modification of exercises relieves.  Questions answered in regards to different knee injections.  He has his first ortho appt in [place] next month. He did have a postivie response to last/first aquatic session with 2  NPRS relief of pain lasting a few days. No change in left prepatellar edema.  He is moving with less guarding submerged. Goals ongoing.    Initial Impresson Patient is a 62 y.o. m who was seen today for physical therapy evaluation and treatment for Chronic pain of both knees. He has another referral for chronic pain syndrome received 2 weeks prior to knee pain referral and had been seen in May for ankle instability, plantar fasciitis, chronic foot pain at another clinic. Pt is a retired Arts development officer. Went into [employer] which he ended up retiring due to becoming disabled.  He presents with general pain throughout entire spine; cervical with radicular pattern into ue to 4-5 digits; lumbar spine with discomfort into bilateral glutes/hips; bilateral knee pain due to OA; bilaterally plantar fascitis and left ankle derangement (due to repeated falls). He is presently using a rollator for amb at all times in the past month which he reports has greatly increased his stability. He has had Falls and near falls due to left ankle and knee "giving way".  Today he reports neck, lb and knee pain are most limiting.  Flares particularly at night decreasing his ability to sleep. He is very highly pain sensitive, has significant strength deficits and poor balance.  He will benefit from skilled physical therapy with intervention aquatic based for at least the first certification with goals to decrease pain allowing for iproved movement patterns and strength.  Will transition to land based when approp.  OBJECTIVE IMPAIRMENTS: Abnormal gait, decreased activity tolerance, decreased balance, decreased endurance, decreased mobility, difficulty walking, decreased ROM, decreased strength, impaired flexibility, impaired sensation, improper body mechanics, and pain.   ACTIVITY LIMITATIONS: carrying, lifting, bending, sitting, standing, squatting, stairs, transfers, bed mobility, continence, bathing, toileting, and locomotion  level  PARTICIPATION LIMITATIONS: meal prep, cleaning, laundry, driving, shopping, community activity, occupation, and yard work  PERSONAL FACTORS: Age, Fitness, Profession, Time since onset of injury/illness/exacerbation,  and multiple orthopedic/LB dysfunctions  are also affecting patient's functional outcome.   REHAB POTENTIAL: Good  CLINICAL DECISION MAKING: Unstable/unpredictable  EVALUATION COMPLEXITY: High   GOALS: Goals reviewed with patient? Yes  SHORT TERM GOALS: Target date: 04/14/23 Pt will tolerate full aquatic sessions consistently without increase in pain and with improving function to demonstrate good toleration and effectiveness of intervention.  Baseline: Goal status: INITIAL  2.  Pt will tolerate walking to or from setting along with tolerating full aquatic session without significnat increase in pain or fatigue Baseline: requires wc Goal status: INITIAL  3.  Pt will complete SLS in 3.6 ft holding R/L x 20s to demonstrate improving balance Baseline: unable to complete Land based Goal status: INITIAL  4.  Pt will complete STS from bench onto pool floor x 10 continuously with ue support on foam and gaining immediate standing balance indep. Baseline:  Goal status: INITIAL  5.  Pt will improve on Tug test to <or=  25s to demonstrate improvement in lower extremity function, mobility and decreased fall risk. Baseline: 34s Goal status: INITIAL  6.  Pt will report fewer waking episodes nightly due to pain to demonstrate improved management of chronic pain Baseline: >5x nightly Goal status: INITIAL  LONG TERM GOALS: Target date: 04/21/23  Pt to meet stated Foto Goal of 37% to demonstrate improved functional status Baseline: 15% Goal status: INITIAL  2.  Pt will improve strength in all areas listed by at least 20lbs to demonstrate improved overall physical function Baseline: see chart Goal status: INITIAL  3.  Pt will amb up to or > 250 ft in 3 minute walk  test to demonstrate improved toleration to amb Baseline: pending completion Goal status: INITIAL  4.  Pt will report decrease in worst pain to </= 7/10 for improved toleration to activity/quality of life and to demonstrate improved management of pain.  Baseline:  Goal status: INITIAL  5.  Pt will improve in bilat knee ROM by at least 10d in both flex and extension Baseline:  Goal status: INITIAL     PLAN:  PT FREQUENCY: 1-2x/week  PT DURATION: 10 weeks 15 visits per [place]  PLANNED INTERVENTIONS: Therapeutic exercises, Therapeutic activity, Neuromuscular re-education, Balance training, Gait training, Patient/Family education, Self Care, Joint mobilization, Stair training, Orthotic/Fit training, DME instructions, Aquatic Therapy, Dry Needling, Electrical stimulation, Cryotherapy, Moist heat, Taping, Ionotophoresis 4mg /ml Dexamethasone, Manual therapy, and Re-evaluation  PLAN FOR NEXT SESSION: Aquatic: strength, gait, stair climbing, STS, balance retraining, toleration to activity   Corrie Dandy (Frankie) Jynesis Nakamura MPT 03/02/2023, 9:46 AM

## 2023-03-06 ENCOUNTER — Encounter (HOSPITAL_BASED_OUTPATIENT_CLINIC_OR_DEPARTMENT_OTHER): Payer: Self-pay | Admitting: Physical Therapy

## 2023-03-06 ENCOUNTER — Ambulatory Visit (HOSPITAL_BASED_OUTPATIENT_CLINIC_OR_DEPARTMENT_OTHER): Payer: No Typology Code available for payment source | Admitting: Physical Therapy

## 2023-03-06 ENCOUNTER — Other Ambulatory Visit: Payer: Federal, State, Local not specified - PPO

## 2023-03-06 DIAGNOSIS — M25561 Pain in right knee: Secondary | ICD-10-CM | POA: Diagnosis not present

## 2023-03-06 DIAGNOSIS — G8929 Other chronic pain: Secondary | ICD-10-CM

## 2023-03-06 DIAGNOSIS — M5459 Other low back pain: Secondary | ICD-10-CM

## 2023-03-06 DIAGNOSIS — M6281 Muscle weakness (generalized): Secondary | ICD-10-CM

## 2023-03-06 NOTE — Therapy (Signed)
OUTPATIENT PHYSICAL THERAPY LOWER EXTREMITY TREATMENT   Patient Name: William Savage MRN: [id number] DOB:Jul 11, 1960, 62 y.o., male Today's Date: 03/06/2023  END OF SESSION:  PT End of Session - 03/06/23 0812     Visit Number 4    Number of Visits 15    Date for PT Re-Evaluation 04/21/23    Authorization Type Department of [facility]  15 visits approved from   12/24/22-04/23/23  [id number]  [facility]: [phone] [name]    PT Start Time 0815    PT Stop Time 0855    PT Time Calculation (min) 40 min    Behavior During Therapy [facility] for tasks assessed/performed             Past Medical History:  Diagnosis Date   Hyperlipidemia    Hypertension    Nonobstructive atherosclerosis of coronary artery    Sleep apnea    TIA (transient ischemic attack)    Past Surgical History:  Procedure Laterality Date   COLONOSCOPY  2021   There are no problems to display for this patient.   PCP: none  REFERRING PROVIDER: Vivi Barrack, DPM / Darlyn Chamber MD  REFERRING DIAG: - Chronic pain of both knees ; Chronic pain syndrome   THERAPY DIAG:  Chronic pain of both knees  Other low back pain  Muscle weakness-general  Rationale for Evaluation and Treatment: Rehabilitation  ONSET DATE: >10 yrs  SUBJECTIVE:   SUBJECTIVE STATEMENT: Pt reports that he felt better a couple days after last session.  He said he tolerated last session better than the first.    PERTINENT HISTORY: 01/16/23 referral from MW:[email or web address] pain of both knees  Department of [facility] 15 visits approved from  12/24/22-04/23/23 [id number] [facility]: [phone] [name]  Referring Provider: Darlyn Chamber   01/03/23 referral from OV:FIEPPIR pain syndrome   11/15/22:ankle instability, plantar fasciitis, chronic foot pain  PAIN:  Are you having pain? Yes: NPRS scale: current 8/10 Lt knee;  6/10 low back  Pain location: see above  Pain description: radiating, achy, dull, stiffness Aggravating  factors: movement, sitting x 15 mins, standing/walking 10 mins; driving Relieving factors: sleep, medication, massage(monthly)   L knee>right 5/10 Ankles 4/10  PRECAUTIONS: Fall  RED FLAGS: None   WEIGHT BEARING RESTRICTIONS: No  FALLS:  Has patient fallen in last 6 months? Yes. Number of falls 1 left ankle gave out  LIVING ENVIRONMENT: Lives with: lives with their spouse Lives in: House/apartment Stairs: Yes: Internal: 16 steps; on right going up Has following equipment at home: Single point cane, Walker - 4 wheeled, and shower chair  OCCUPATION: disabled  PLOF: Requires assistive device for independence  PATIENT GOALS: less pain, get stronger, walk with AD  NEXT MD VISIT: as needed  OBJECTIVE:   DIAGNOSTIC FINDINGS:  01/25/23: AP lateral merchant radiographs left knee reviewed.  Medial compartment  arthritis which is severe is present.  Bone-on-bone changes noted with  some spurring on the medial side.  Lateral patellofemoral compartments  have less significant degenerative changes present.  Alignment is slight  varus.   AP lateral merchant radiographs right knee reviewed.  Alignment intact.   Mild medial joint space narrowing is present.  No spurs in the medial  lateral or patellofemoral compartments.   AP lateral radiographs lumbar spine reviewed.  Grade 1 spondylolisthesis  at L4-5 is present with significant degenerative disc disease at L5-S1.   Facet arthritis also pronounced at these levels.  No compression fracture.   X-ray cervical spine:  neg  PATIENT SURVEYS:  FOTO primary score 15% with goal of 37%  COGNITION: Overall cognitive status: Within functional limits for tasks assessed     SENSATION: N&T 4th and 5th fingers R/L    MUSCLE LENGTH: Hamstrings: limited bilaterally   POSTURE: decreased lumbar lordosis  PALPATION: TTP throughout upper and lower back, glutes and with patellar mobs.  LOWER EXTREMITY ROM:   All movements limited by  pain Active ROM Right eval Left eval  Hip flexion    Hip extension    Hip abduction    Hip adduction    Hip internal rotation    Hip external rotation    Knee flexion 86 90  Knee extension 30 35  Ankle dorsiflexion 0 0  Ankle plantarflexion    Ankle inversion    Ankle eversion     (Blank rows = not tested)  LOWER EXTREMITY MMT:  MMT Right eval Left eval  Hip flexion 17.7 16.2  Hip extension    Hip abduction 14.0 22.7  Hip adduction    Hip internal rotation    Hip external rotation    Knee flexion    Knee extension 15.4 10.5  Ankle dorsiflexion    Ankle plantarflexion    Ankle inversion    Ankle eversion     (Blank rows = not tested)  LOWER EXTREMITY SPECIAL TESTS:  Unable to toelrate  FUNCTIONAL TESTS:  5 times sit to stand: not tolerated Timed up and go (TUG): 34.33 3 minute walk test: ran out of time.  Staged balance:0/4 GAIT: Distance walked: 577ft Assistive device utilized: Environmental consultant - 4 wheeled Level of assistance: Complete Independence Comments: rollator, decreased hip/knee flex, just clearing feet from floor, minimal heel strike and toe off, Posture guarded/antalgic    TODAY'S TREATMENT:                                                                                                                              Pt seen for aquatic therapy today.  Treatment took place in water 3.5-4.75 ft in depth at the [facility] pool. Temp of water was 91.  Pt entered/exited the pool via stairs and step to pattern with bilateral hand rail. Feels better facing lap pool.   *UE support barbell walking forward and backward x 2 laps; unsupported with reciprocal arm swing * side stepping x 2 laps with varied step height; repeated with rainbow hand floats and arm addct/abdct * farmer carry with short hollow noodle at side, walking forward / backward  * standard stance with bilat horiz abdct/ addct, UE under surface and no added resistance x 10 * TrA set with short  1/2 noodle pull down to thighs with 4 sec return to surface, with isometric holds; repeated in staggered stance (doesn't tolerate LLE in back) * marching forward/ backward with row motion holding noodle; forward walking gentle kicks * return to walking forward/ backward with reciprocal arm swing * straddling noodle between legs and  additional noodle under arms behind back: Cycling forward.  Cues for body/LE positioning   Pt requires the buoyancy and hydrostatic pressure of water for support, and to offload joints by unweighting joint load by at least 50 % in navel deep water and by at least 75-80% in chest to neck deep water.  Viscosity of the water is needed for resistance of strengthening. Water current perturbations provides challenge to standing balance requiring increased core activation.   PATIENT EDUCATION:  Education details: aquatic program progression Person educated: Patient Education method: Explanation Education comprehension: verbalized understanding  HOME EXERCISE PROGRAM: TBA  ASSESSMENT:  CLINICAL IMPRESSION: Pt reported that his Lt knee feels better when he faces lap pool with walking and exercise.  He complained of increased neck tension with resisted shoulder addct with rainbow hand floats.  He requires short submerged rest breaks for pain relief to back.  Pt required minor cues for body positioning for improved balance while sitting on yellow noodle.  He reported gradual reduction of pain in back/knee during session. Goals ongoing.   He will benefit from skilled physical therapy with intervention aquatic based for at least the first certification with goals to decrease pain allowing for improved movement patterns and strength.  Will transition to land based when approp.  OBJECTIVE IMPAIRMENTS: Abnormal gait, decreased activity tolerance, decreased balance, decreased endurance, decreased mobility, difficulty walking, decreased ROM, decreased strength, impaired flexibility,  impaired sensation, improper body mechanics, and pain.   ACTIVITY LIMITATIONS: carrying, lifting, bending, sitting, standing, squatting, stairs, transfers, bed mobility, continence, bathing, toileting, and locomotion level  PARTICIPATION LIMITATIONS: meal prep, cleaning, laundry, driving, shopping, community activity, occupation, and yard work  PERSONAL FACTORS: Age, Fitness, Profession, Time since onset of injury/illness/exacerbation, and multiple orthopedic/LB dysfunctions  are also affecting patient's functional outcome.   REHAB POTENTIAL: Good  CLINICAL DECISION MAKING: Unstable/unpredictable  EVALUATION COMPLEXITY: High   GOALS: Goals reviewed with patient? Yes  SHORT TERM GOALS: Target date: 04/14/23 Pt will tolerate full aquatic sessions consistently without increase in pain and with improving function to demonstrate good toleration and effectiveness of intervention.  Baseline: Goal status: INITIAL  2.  Pt will tolerate walking to or from setting along with tolerating full aquatic session without significnat increase in pain or fatigue Baseline: requires wc Goal status: INITIAL  3.  Pt will complete SLS in 3.6 ft holding R/L x 20s to demonstrate improving balance Baseline: unable to complete Land based Goal status: INITIAL  4.  Pt will complete STS from bench onto pool floor x 10 continuously with ue support on foam and gaining immediate standing balance indep. Baseline:  Goal status: INITIAL  5.  Pt will improve on Tug test to <or=  25s to demonstrate improvement in lower extremity function, mobility and decreased fall risk. Baseline: 34s Goal status: INITIAL  6.  Pt will report fewer waking episodes nightly due to pain to demonstrate improved management of chronic pain Baseline: >5x nightly Goal status: INITIAL  LONG TERM GOALS: Target date: 04/21/23  Pt to meet stated Foto Goal of 37% to demonstrate improved functional status Baseline: 15% Goal status:  INITIAL  2.  Pt will improve strength in all areas listed by at least 20lbs to demonstrate improved overall physical function Baseline: see chart Goal status: INITIAL  3.  Pt will amb up to or > 250 ft in 3 minute walk test to demonstrate improved toleration to amb Baseline: pending completion Goal status: INITIAL  4.  Pt will report decrease in worst pain  to </= 7/10 for improved toleration to activity/quality of life and to demonstrate improved management of pain.  Baseline:  Goal status: INITIAL  5.  Pt will improve in bilat knee ROM by at least 10d in both flex and extension Baseline:  Goal status: INITIAL     PLAN:  PT FREQUENCY: 1-2x/week  PT DURATION: 10 weeks 15 visits per [place]  PLANNED INTERVENTIONS: Therapeutic exercises, Therapeutic activity, Neuromuscular re-education, Balance training, Gait training, Patient/Family education, Self Care, Joint mobilization, Stair training, Orthotic/Fit training, DME instructions, Aquatic Therapy, Dry Needling, Electrical stimulation, Cryotherapy, Moist heat, Taping, Ionotophoresis 4mg /ml Dexamethasone, Manual therapy, and Re-evaluation  PLAN FOR NEXT SESSION: Aquatic: strength, gait, stair climbing, STS, balance retraining, toleration to activity  Mayer Camel, PTA 03/06/23 10:23 AM [facility] [address] [place], [place], [address] Phone: [phone]   Fax:  [phone]

## 2023-03-08 ENCOUNTER — Ambulatory Visit (HOSPITAL_BASED_OUTPATIENT_CLINIC_OR_DEPARTMENT_OTHER): Payer: No Typology Code available for payment source | Admitting: Physical Therapy

## 2023-03-08 ENCOUNTER — Encounter (HOSPITAL_BASED_OUTPATIENT_CLINIC_OR_DEPARTMENT_OTHER): Payer: Self-pay | Admitting: Physical Therapy

## 2023-03-08 DIAGNOSIS — R2689 Other abnormalities of gait and mobility: Secondary | ICD-10-CM

## 2023-03-08 DIAGNOSIS — M5459 Other low back pain: Secondary | ICD-10-CM

## 2023-03-08 DIAGNOSIS — G8929 Other chronic pain: Secondary | ICD-10-CM

## 2023-03-08 DIAGNOSIS — M6281 Muscle weakness (generalized): Secondary | ICD-10-CM

## 2023-03-08 DIAGNOSIS — M25561 Pain in right knee: Secondary | ICD-10-CM | POA: Diagnosis not present

## 2023-03-08 NOTE — Therapy (Signed)
OUTPATIENT PHYSICAL THERAPY LOWER EXTREMITY TREATMENT   Patient Name: William Savage MRN: [id number] DOB:[birth year], 62 y.o., male Today's Date: 03/08/2023  END OF SESSION:  PT End of Session - 03/08/23 1031     Visit Number 5    Number of Visits 15    Authorization Type Department of [facility]  15 visits approved from   12/24/22-04/23/23  [id number]  [facility]: [phone] William Savage    PT Start Time 1023    PT Stop Time 1103    PT Time Calculation (min) 40 min    Activity Tolerance Patient tolerated treatment well;Patient limited by pain    Behavior During Therapy [facility] for tasks assessed/performed             Past Medical History:  Diagnosis Date   Hyperlipidemia    Hypertension    Nonobstructive atherosclerosis of coronary artery    Sleep apnea    TIA (transient ischemic attack)    Past Surgical History:  Procedure Laterality Date   COLONOSCOPY  2021   There are no problems to display for this patient.   PCP: none  REFERRING PROVIDER: Vivi Savage, DPM / Darlyn Chamber MD  REFERRING DIAG: - Chronic pain of both knees ; Chronic pain syndrome   THERAPY DIAG:  Chronic pain of both knees  Other low back pain  Muscle weakness-general  Other abnormalities of gait and mobility  Rationale for Evaluation and Treatment: Rehabilitation  ONSET DATE: >10 yrs  SUBJECTIVE:   SUBJECTIVE STATEMENT: "I'm not using the rollator as much, I'm getting around better"  Pt continues to wear braces on knees (outside of pool). He report tingling in LUE today (comes/goes)    PERTINENT HISTORY: 01/16/23 referral from HQ:[email or web address] pain of both knees  Department of [facility] 15 visits approved from  12/24/22-04/23/23 [id number] [facility]: [phone] William Savage  Referring Provider: Darlyn Chamber   01/03/23 referral from KV:[id number] pain syndrome   11/15/22:ankle instability, plantar fasciitis, chronic foot pain  PAIN:  Are you having pain? Yes: NPRS scale:  current 7/10 Lt knee;  7/10 Lt shoulder to 5th finger  Pain location: see above  Pain description: radiating, achy, dull, stiffness Aggravating factors: movement, sitting x 15 mins, standing/walking 10 mins; driving Relieving factors: sleep, medication, massage(monthly)    PRECAUTIONS: Fall  RED FLAGS: None   WEIGHT BEARING RESTRICTIONS: No  FALLS:  Has patient fallen in last 6 months? Yes. Number of falls 1 left ankle gave out  LIVING ENVIRONMENT: Lives with: lives with their spouse Lives in: House/apartment Stairs: Yes: Internal: 16 steps; on right going up Has following equipment at home: Single point cane, Walker - 4 wheeled, and shower chair  OCCUPATION: disabled  PLOF: Requires assistive device for independence  PATIENT GOALS: less pain, get stronger, walk with AD  NEXT MD VISIT: as needed  OBJECTIVE:   DIAGNOSTIC FINDINGS:  01/25/23: AP lateral merchant radiographs left knee reviewed.  Medial compartment  arthritis which is severe is present.  Bone-on-bone changes noted with  some spurring on the medial side.  Lateral patellofemoral compartments  have less significant degenerative changes present.  Alignment is slight  varus.   AP lateral merchant radiographs right knee reviewed.  Alignment intact.   Mild medial joint space narrowing is present.  No spurs in the medial  lateral or patellofemoral compartments.   AP lateral radiographs lumbar spine reviewed.  Grade 1 spondylolisthesis  at L4-5 is present with significant degenerative disc disease at L5-S1.   Facet  arthritis also pronounced at these levels.  No compression fracture.   X-ray cervical spine: neg  PATIENT SURVEYS:  FOTO primary score 15% with goal of 37%  COGNITION: Overall cognitive status: Within functional limits for tasks assessed     SENSATION: N&T 4th and 5th fingers R/L    MUSCLE LENGTH: Hamstrings: limited bilaterally   POSTURE: decreased lumbar lordosis  PALPATION: TTP  throughout upper and lower back, glutes and with patellar mobs.  LOWER EXTREMITY ROM:   All movements limited by pain Active ROM Right eval Left eval  Hip flexion    Hip extension    Hip abduction    Hip adduction    Hip internal rotation    Hip external rotation    Knee flexion 86 90  Knee extension 30 35  Ankle dorsiflexion 0 0  Ankle plantarflexion    Ankle inversion    Ankle eversion     (Blank rows = not tested)  LOWER EXTREMITY MMT:  MMT Right eval Left eval  Hip flexion 17.7 16.2  Hip extension    Hip abduction 14.0 22.7  Hip adduction    Hip internal rotation    Hip external rotation    Knee flexion    Knee extension 15.4 10.5  Ankle dorsiflexion    Ankle plantarflexion    Ankle inversion    Ankle eversion     (Blank rows = not tested)  LOWER EXTREMITY SPECIAL TESTS:  Unable to toelrate  FUNCTIONAL TESTS:  5 times sit to stand: not tolerated Timed up and go (TUG): 34.33 3 minute walk test: ran out of time.  Staged balance:0/4 GAIT: Distance walked: 558ft Assistive device utilized: Environmental consultant - 4 wheeled Level of assistance: Complete Independence Comments: rollator, decreased hip/knee flex, just clearing feet from floor, minimal heel strike and toe off, Posture guarded/antalgic    TODAY'S TREATMENT:                                                                                                                              Pt seen for aquatic therapy today.  Treatment took place in water 3.5-4.75 ft in depth at the [facility] pool. Temp of water was 91.  Pt entered/exited the pool via stairs and step to pattern with bilateral hand rail.  *UE unsupported forward and backward x 3 laps with reciprocal arm swing * LUE nerve glides - birdman (ulnar) and lateral neck flexion with arm abdct/wrist ext  * holding wall:  hip abdct/ addct x 10; heel /toe raises x 10; leg swings into hip flex/ext 2 x 10  * side stepping x 2 laps with varied step height  with arm addct/abdct * forward walking kick (irritated L knee) * forward/ backward marching with coordinating breast stroke arms ( some increase in Lt LBP) * wide stance with kick board row x 10, then in small vectors (11 and 1 'o clock)  * return to forward/ backweard walking with reciprocal  arm swing * straddling yellow noodle and yellow noodle under LEs, cycling forward for decompression of spine  Pt requires the buoyancy and hydrostatic pressure of water for support, and to offload joints by unweighting joint load by at least 50 % in navel deep water and by at least 75-80% in chest to neck deep water.  Viscosity of the water is needed for resistance of strengthening. Water current perturbations provides challenge to standing balance requiring increased core activation.   PATIENT EDUCATION:  Education details: aquatic program progression Person educated: Patient Education method: Explanation Education comprehension: verbalized understanding  HOME EXERCISE PROGRAM: TBA  ASSESSMENT:  CLINICAL IMPRESSION: Pt reported minor locking in his Rt knee and gradual reduction of pain overall while exercising in water. Alternating between facing lap pool and hot tub for effects on knee ext in Lt knee.  Pt reporting transition to cane from rollator indicating improvement in mobility. Progressing well towards goals.    He will benefit from skilled physical therapy with intervention aquatic based for at least the first certification with goals to decrease pain allowing for improved movement patterns and strength.  Will transition to land based when approp.  OBJECTIVE IMPAIRMENTS: Abnormal gait, decreased activity tolerance, decreased balance, decreased endurance, decreased mobility, difficulty walking, decreased ROM, decreased strength, impaired flexibility, impaired sensation, improper body mechanics, and pain.   ACTIVITY LIMITATIONS: carrying, lifting, bending, sitting, standing, squatting, stairs,  transfers, bed mobility, continence, bathing, toileting, and locomotion level  PARTICIPATION LIMITATIONS: meal prep, cleaning, laundry, driving, shopping, community activity, occupation, and yard work  PERSONAL FACTORS: Age, Fitness, Profession, Time since onset of injury/illness/exacerbation, and multiple orthopedic/LB dysfunctions  are also affecting patient's functional outcome.   REHAB POTENTIAL: Good  CLINICAL DECISION MAKING: Unstable/unpredictable  EVALUATION COMPLEXITY: High   GOALS: Goals reviewed with patient? Yes  SHORT TERM GOALS: Target date: 04/14/23 Pt will tolerate full aquatic sessions consistently without increase in pain and with improving function to demonstrate good toleration and effectiveness of intervention.  Baseline: Goal status: INITIAL  2.  Pt will tolerate walking to or from setting along with tolerating full aquatic session without significnat increase in pain or fatigue Baseline: requires wc Goal status: INITIAL  3.  Pt will complete SLS in 3.6 ft holding R/L x 20s to demonstrate improving balance Baseline: unable to complete Land based Goal status: INITIAL  4.  Pt will complete STS from bench onto pool floor x 10 continuously with ue support on foam and gaining immediate standing balance indep. Baseline:  Goal status: INITIAL  5.  Pt will improve on Tug test to <or=  25s to demonstrate improvement in lower extremity function, mobility and decreased fall risk. Baseline: 34s Goal status: INITIAL  6.  Pt will report fewer waking episodes nightly due to pain to demonstrate improved management of chronic pain Baseline: >5x nightly Goal status: INITIAL  LONG TERM GOALS: Target date: 04/21/23  Pt to meet stated Foto Goal of 37% to demonstrate improved functional status Baseline: 15% Goal status: INITIAL  2.  Pt will improve strength in all areas listed by at least 20lbs to demonstrate improved overall physical function Baseline: see chart Goal  status: INITIAL  3.  Pt will amb up to or > 250 ft in 3 minute walk test to demonstrate improved toleration to amb Baseline: pending completion Goal status: INITIAL  4.  Pt will report decrease in worst pain to </= 7/10 for improved toleration to activity/quality of life and to demonstrate improved management of pain.  Baseline:  Goal status: INITIAL  5.  Pt will improve in bilat knee ROM by at least 10d in both flex and extension Baseline:  Goal status: INITIAL     PLAN:  PT FREQUENCY: 1-2x/week  PT DURATION: 10 weeks [facility] visits per [facility]  PLANNED INTERVENTIONS: Therapeutic exercises, Therapeutic activity, Neuromuscular re-education, Balance training, Gait training, Patient/Family education, Self Care, Joint mobilization, Stair training, Orthotic/Fit training, DME instructions, Aquatic Therapy, Dry Needling, Electrical stimulation, Cryotherapy, Moist heat, Taping, Ionotophoresis 4mg /ml Dexamethasone, Manual therapy, and Re-evaluation  PLAN FOR NEXT SESSION: Aquatic: strength, gait, stair climbing, STS, balance retraining, toleration to activity  Mayer Camel, PTA 03/08/23 11:09 AM [facility] [address] [place], [place], [address] Phone: [phone]   Fax:  [phone]

## 2023-03-14 ENCOUNTER — Ambulatory Visit (HOSPITAL_BASED_OUTPATIENT_CLINIC_OR_DEPARTMENT_OTHER): Payer: No Typology Code available for payment source | Admitting: Physical Therapy

## 2023-03-16 ENCOUNTER — Ambulatory Visit (HOSPITAL_BASED_OUTPATIENT_CLINIC_OR_DEPARTMENT_OTHER): Payer: No Typology Code available for payment source | Admitting: Physical Therapy

## 2023-03-20 ENCOUNTER — Ambulatory Visit: Payer: Federal, State, Local not specified - PPO | Admitting: Podiatry

## 2023-03-21 ENCOUNTER — Ambulatory Visit (HOSPITAL_BASED_OUTPATIENT_CLINIC_OR_DEPARTMENT_OTHER): Payer: No Typology Code available for payment source | Attending: Endocrinology | Admitting: Physical Therapy

## 2023-03-21 ENCOUNTER — Encounter (HOSPITAL_BASED_OUTPATIENT_CLINIC_OR_DEPARTMENT_OTHER): Payer: Self-pay | Admitting: Physical Therapy

## 2023-03-21 DIAGNOSIS — G894 Chronic pain syndrome: Secondary | ICD-10-CM | POA: Diagnosis present

## 2023-03-21 DIAGNOSIS — M1712 Unilateral primary osteoarthritis, left knee: Secondary | ICD-10-CM | POA: Diagnosis present

## 2023-03-21 DIAGNOSIS — M722 Plantar fascial fibromatosis: Secondary | ICD-10-CM | POA: Insufficient documentation

## 2023-03-21 DIAGNOSIS — M5459 Other low back pain: Secondary | ICD-10-CM | POA: Diagnosis present

## 2023-03-21 DIAGNOSIS — M6281 Muscle weakness (generalized): Secondary | ICD-10-CM | POA: Insufficient documentation

## 2023-03-21 DIAGNOSIS — M545 Low back pain, unspecified: Secondary | ICD-10-CM | POA: Insufficient documentation

## 2023-03-21 DIAGNOSIS — M1A09X0 Idiopathic chronic gout, multiple sites, without tophus (tophi): Secondary | ICD-10-CM | POA: Insufficient documentation

## 2023-03-21 DIAGNOSIS — R2689 Other abnormalities of gait and mobility: Secondary | ICD-10-CM | POA: Diagnosis present

## 2023-03-21 DIAGNOSIS — M5410 Radiculopathy, site unspecified: Secondary | ICD-10-CM | POA: Diagnosis present

## 2023-03-21 DIAGNOSIS — M25561 Pain in right knee: Secondary | ICD-10-CM | POA: Diagnosis present

## 2023-03-21 DIAGNOSIS — M25462 Effusion, left knee: Secondary | ICD-10-CM | POA: Insufficient documentation

## 2023-03-21 DIAGNOSIS — G8929 Other chronic pain: Secondary | ICD-10-CM | POA: Diagnosis present

## 2023-03-21 DIAGNOSIS — M25562 Pain in left knee: Secondary | ICD-10-CM | POA: Diagnosis present

## 2023-03-21 NOTE — Therapy (Signed)
OUTPATIENT PHYSICAL THERAPY LOWER EXTREMITY TREATMENT   Patient Name: William Savage MRN: [id number] DOB:1960/09/10, 62 y.o., male Today's Date: 03/21/2023  END OF SESSION:  PT End of Session - 03/21/23 1030     Visit Number 6    Number of Visits 15    Date for PT Re-Evaluation 04/21/23    Authorization Type Department of [facility]  15 visits approved from   12/24/22-04/23/23  [id number]  [facility]: [phone] William Savage    PT Start Time 1025    PT Stop Time 1105    PT Time Calculation (min) 40 min             Past Medical History:  Diagnosis Date   Hyperlipidemia    Hypertension    Nonobstructive atherosclerosis of coronary artery    Sleep apnea    TIA (transient ischemic attack)    Past Surgical History:  Procedure Laterality Date   COLONOSCOPY  2021   There are no problems to display for this patient.   PCP: none  REFERRING PROVIDER: Vivi Savage, DPM / William Chamber MD  REFERRING DIAG: - Chronic pain of both knees ; Chronic pain syndrome   THERAPY DIAG:  Chronic pain of both knees  Other low back pain  Muscle weakness-general  Other abnormalities of gait and mobility  Rationale for Evaluation and Treatment: Rehabilitation  ONSET DATE: >10 yrs  SUBJECTIVE:   SUBJECTIVE STATEMENT: Pt reports that he is feeling more mobile with his Lt knee.  [facility] [place] [facility] [facility] [employer]  "I'll need to make it a priority".   PERTINENT HISTORY: 01/16/23 referral from IR:[id number] pain of both knees  Department of [facility] 15 visits approved from  12/24/22-04/23/23 [id number] [facility]: [phone] William Savage  Referring Provider: Darlyn Savage   01/03/23 referral from RK:[id number] pain syndrome   11/15/22:ankle instability, plantar fasciitis, chronic foot pain  PAIN:  Are you having pain? Yes: NPRS scale: current 7/10 Lt knee;  7/10 Lt shoulder to 5th finger  Pain location: see above  Pain description: radiating, achy, dull,  stiffness Aggravating factors: movement, sitting x 15 mins, standing/walking 10 mins; driving Relieving factors: sleep, medication, massage(monthly)    PRECAUTIONS: Fall  RED FLAGS: None   WEIGHT BEARING RESTRICTIONS: No  FALLS:  Has patient fallen in last 6 months? Yes. Number of falls 1 left ankle gave out  LIVING ENVIRONMENT: Lives with: lives with their spouse Lives in: House/apartment Stairs: Yes: Internal: 16 steps; on right going up Has following equipment at home: Single point cane, Walker - 4 wheeled, and shower chair  OCCUPATION: disabled  PLOF: Requires assistive device for independence  PATIENT GOALS: less pain, get stronger, walk with AD  NEXT MD VISIT: as needed  OBJECTIVE:   DIAGNOSTIC FINDINGS:  01/25/23: AP lateral merchant radiographs left knee reviewed.  Medial compartment  arthritis which is severe is present.  Bone-on-bone changes noted with  some spurring on the medial side.  Lateral patellofemoral compartments  have less significant degenerative changes present.  Alignment is slight  varus.   AP lateral merchant radiographs right knee reviewed.  Alignment intact.   Mild medial joint space narrowing is present.  No spurs in the medial  lateral or patellofemoral compartments.   AP lateral radiographs lumbar spine reviewed.  Grade 1 spondylolisthesis  at L4-5 is present with significant degenerative disc disease at L5-S1.   Facet arthritis also pronounced at these levels.  No compression fracture.   X-ray  cervical spine: neg  PATIENT SURVEYS:  FOTO primary score 15% with goal of 37%  COGNITION: Overall cognitive status: Within functional limits for tasks assessed     SENSATION: N&T 4th and 5th fingers R/L    MUSCLE LENGTH: Hamstrings: limited bilaterally   POSTURE: decreased lumbar lordosis  PALPATION: TTP throughout upper and lower back, glutes and with patellar mobs.  LOWER EXTREMITY ROM:   All movements limited by pain Active  ROM Right eval Left eval  Hip flexion    Hip extension    Hip abduction    Hip adduction    Hip internal rotation    Hip external rotation    Knee flexion 86 90  Knee extension 30 35  Ankle dorsiflexion 0 0  Ankle plantarflexion    Ankle inversion    Ankle eversion     (Blank rows = not tested)  LOWER EXTREMITY MMT:  MMT Right eval Left eval  Hip flexion 17.7 16.2  Hip extension    Hip abduction 14.0 22.7  Hip adduction    Hip internal rotation    Hip external rotation    Knee flexion    Knee extension 15.4 10.5  Ankle dorsiflexion    Ankle plantarflexion    Ankle inversion    Ankle eversion     (Blank rows = not tested)  LOWER EXTREMITY SPECIAL TESTS:  Unable to toelrate  FUNCTIONAL TESTS:  5 times sit to stand: not tolerated Timed up and go (TUG): 34.33 3 minute walk test: ran out of time.  Staged balance:0/4 GAIT: Distance walked: 527ft Assistive device utilized: Environmental consultant - 4 wheeled Level of assistance: Complete Independence Comments: rollator, decreased hip/knee flex, just clearing feet from floor, minimal heel strike and toe off, Posture guarded/antalgic    TODAY'S TREATMENT:                                                                                                                              Pt seen for aquatic therapy today.  Treatment took place in water 3.5-4.75 ft in depth at the [facility] pool. Temp of water was 91.  Pt entered/exited the pool via stairs and step to pattern with bilateral hand rail.  *UE unsupported forward and backward x 3 laps with reciprocal arm swing *reviewed LUE nerve glides - birdman (ulnar) and lateral neck flexion with arm abdct/wrist ext  * side stepping x 2 laps with varied step height with arm addct/abdct (tightened back) * staggered stance with kick board row, 2 x 10 * hands flat on kick board with light trunk rotation * holding wall:  hip abdct/ addct 2 x 10 (tightened back; short rest in squat);   leg swings into hip flex/ext  x 10; heel /toe raises x 10; * trial of knees to chest stretch with hands on rails and feet in first ladder hole (not tolerated);  trunk ext x 5 sec * TrA set with hollow blue noodle pull  down x 5 * straddling yellow noodle and yellow noodle under LEs, cycling forward for decompression of spine  Pt requires the buoyancy and hydrostatic pressure of water for support, and to offload joints by unweighting joint load by at least 50 % in navel deep water and by at least 75-80% in chest to neck deep water.  Viscosity of the water is needed for resistance of strengthening. Water current perturbations provides challenge to standing balance requiring increased core activation.   PATIENT EDUCATION:  Education details: aquatic program progression Person educated: Patient Education method: Explanation Education comprehension: verbalized understanding  HOME EXERCISE PROGRAM: TBA  ASSESSMENT:  CLINICAL IMPRESSION: Pt reported some tightening of lower back with side stepping and hip abdct/addct at wall, and with TrA set with noodle pull down; eased with suspended cycling. Pt was able to tolerate staggered stance today, which was improvement.   Progressing well towards goals.    He will benefit from skilled physical therapy with intervention aquatic based for at least the first certification with goals to decrease pain allowing for improved movement patterns and strength.  Will transition to land based when approp.  OBJECTIVE IMPAIRMENTS: Abnormal gait, decreased activity tolerance, decreased balance, decreased endurance, decreased mobility, difficulty walking, decreased ROM, decreased strength, impaired flexibility, impaired sensation, improper body mechanics, and pain.   ACTIVITY LIMITATIONS: carrying, lifting, bending, sitting, standing, squatting, stairs, transfers, bed mobility, continence, bathing, toileting, and locomotion level  PARTICIPATION LIMITATIONS: meal prep,  cleaning, laundry, driving, shopping, community activity, occupation, and yard work  PERSONAL FACTORS: Age, Fitness, Profession, Time since onset of injury/illness/exacerbation, and multiple orthopedic/LB dysfunctions  are also affecting patient's functional outcome.   REHAB POTENTIAL: Good  CLINICAL DECISION MAKING: Unstable/unpredictable  EVALUATION COMPLEXITY: High   GOALS: Goals reviewed with patient? Yes  SHORT TERM GOALS: Target date: 04/14/23 Pt will tolerate full aquatic sessions consistently without increase in pain and with improving function to demonstrate good toleration and effectiveness of intervention.  Baseline: Goal status: INITIAL  2.  Pt will tolerate walking to or from setting along with tolerating full aquatic session without significnat increase in pain or fatigue Baseline: requires wc Goal status: INITIAL  3.  Pt will complete SLS in 3.6 ft holding R/L x 20s to demonstrate improving balance Baseline: unable to complete Land based Goal status: INITIAL  4.  Pt will complete STS from bench onto pool floor x 10 continuously with ue support on foam and gaining immediate standing balance indep. Baseline:  Goal status: INITIAL  5.  Pt will improve on Tug test to <or=  25s to demonstrate improvement in lower extremity function, mobility and decreased fall risk. Baseline: 34s Goal status: INITIAL  6.  Pt will report fewer waking episodes nightly due to pain to demonstrate improved management of chronic pain Baseline: >5x nightly Goal status: INITIAL  LONG TERM GOALS: Target date: 04/21/23  Pt to meet stated Foto Goal of 37% to demonstrate improved functional status Baseline: 15% Goal status: INITIAL  2.  Pt will improve strength in all areas listed by at least 20lbs to demonstrate improved overall physical function Baseline: see chart Goal status: INITIAL  3.  Pt will amb up to or > 250 ft in 3 minute walk test to demonstrate improved toleration to  amb Baseline: pending completion Goal status: INITIAL  4.  Pt will report decrease in worst pain to </= 7/10 for improved toleration to activity/quality of life and to demonstrate improved management of pain.  Baseline:  Goal status: INITIAL  5.  Pt will improve in bilat knee ROM by at least 10d in both flex and extension Baseline:  Goal status: INITIAL     PLAN:  PT FREQUENCY: 1-2x/week  PT DURATION: 10 weeks 15 visits per [facility]  PLANNED INTERVENTIONS: Therapeutic exercises, Therapeutic activity, Neuromuscular re-education, Balance training, Gait training, Patient/Family education, Self Care, Joint mobilization, Stair training, Orthotic/Fit training, DME instructions, Aquatic Therapy, Dry Needling, Electrical stimulation, Cryotherapy, Moist heat, Taping, Ionotophoresis 4mg /ml Dexamethasone, Manual therapy, and Re-evaluation  PLAN FOR NEXT SESSION: Aquatic: strength, gait, stair climbing, STS, balance retraining, toleration to activity  Mayer Camel, PTA 03/21/23 11:08 AM [facility] [address] [place], [place], [address] Phone: [phone]   Fax:  [phone]

## 2023-03-23 ENCOUNTER — Encounter (HOSPITAL_BASED_OUTPATIENT_CLINIC_OR_DEPARTMENT_OTHER): Payer: Self-pay | Admitting: Physical Therapy

## 2023-03-23 ENCOUNTER — Ambulatory Visit (HOSPITAL_BASED_OUTPATIENT_CLINIC_OR_DEPARTMENT_OTHER): Payer: No Typology Code available for payment source | Admitting: Physical Therapy

## 2023-03-23 DIAGNOSIS — G8929 Other chronic pain: Secondary | ICD-10-CM

## 2023-03-23 DIAGNOSIS — M6281 Muscle weakness (generalized): Secondary | ICD-10-CM

## 2023-03-23 DIAGNOSIS — R2689 Other abnormalities of gait and mobility: Secondary | ICD-10-CM

## 2023-03-23 DIAGNOSIS — M5459 Other low back pain: Secondary | ICD-10-CM

## 2023-03-23 DIAGNOSIS — M25561 Pain in right knee: Secondary | ICD-10-CM | POA: Diagnosis not present

## 2023-03-23 NOTE — Therapy (Signed)
OUTPATIENT PHYSICAL THERAPY LOWER EXTREMITY TREATMENT   Patient Name: William Savage MRN: [id number] DOB:08-Dec-1960, 62 y.o., male Today's Date: 03/23/2023  END OF SESSION:  PT End of Session - 03/23/23 1049     Visit Number 7    Number of Visits 15    Date for PT Re-Evaluation 04/21/23    Authorization Type Department of [facility]  15 visits approved from   12/24/22-04/23/23  [id number]  [facility]: [phone] [facility]    Authorization - Visit Number --    Authorization - Number of Visits --    PT Start Time 1026    PT Stop Time 1107    PT Time Calculation (min) 41 min    Activity Tolerance Patient tolerated treatment well    Behavior During Therapy [facility] for tasks assessed/performed              Past Medical History:  Diagnosis Date   Hyperlipidemia    Hypertension    Nonobstructive atherosclerosis of coronary artery    Sleep apnea    TIA (transient ischemic attack)    Past Surgical History:  Procedure Laterality Date   COLONOSCOPY  2021   There are no problems to display for this patient.   PCP: none  REFERRING PROVIDER: Vivi Barrack, DPM / Darlyn Chamber MD  REFERRING DIAG: - Chronic pain of both knees ; Chronic pain syndrome   THERAPY DIAG:  Chronic pain of both knees  Other low back pain  Muscle weakness-general  Other abnormalities of gait and mobility  Rationale for Evaluation and Treatment: Rehabilitation  ONSET DATE: >10 yrs  SUBJECTIVE:   SUBJECTIVE STATEMENT: Pt reports that he was turning Lt and the Lt knee buckled a little bit, felt unsteady.  "I got a little frustrated, I thought I was doing so good".    PERTINENT HISTORY: 01/16/23 referral from MW:[email or web address] pain of both knees  Department of [facility] 15 visits approved from  12/24/22-04/23/23 [id number] [facility]: [phone] [name]  Referring Provider: Darlyn Chamber   01/03/23 referral from OV:FIEPPIR pain syndrome   11/15/22:ankle instability, plantar  fasciitis, chronic foot pain  PAIN:  Are you having pain? Yes: NPRS scale: current 8/10 Lt knee;  6/10 Lt shoulder to 4th -5th finger; back 7//10 Pain location: see above  Pain description: radiating, achy, dull, stiffness Aggravating factors: movement, sitting x 15 mins, standing/walking 10 mins; driving Relieving factors: sleep, medication, massage(monthly)    PRECAUTIONS: Fall  RED FLAGS: None   WEIGHT BEARING RESTRICTIONS: No  FALLS:  Has patient fallen in last 6 months? Yes. Number of falls 1 left ankle gave out  LIVING ENVIRONMENT: Lives with: lives with their spouse Lives in: House/apartment Stairs: Yes: Internal: 16 steps; on right going up Has following equipment at home: Single point cane, Walker - 4 wheeled, and shower chair  OCCUPATION: disabled  PLOF: Requires assistive device for independence  PATIENT GOALS: less pain, get stronger, walk with AD  NEXT MD VISIT: as needed  OBJECTIVE:   DIAGNOSTIC FINDINGS:  01/25/23: AP lateral merchant radiographs left knee reviewed.  Medial compartment  arthritis which is severe is present.  Bone-on-bone changes noted with  some spurring on the medial side.  Lateral patellofemoral compartments  have less significant degenerative changes present.  Alignment is slight  varus.   AP lateral merchant radiographs right knee reviewed.  Alignment intact.   Mild medial joint space narrowing is present.  No spurs in the medial  lateral or patellofemoral compartments.  AP lateral radiographs lumbar spine reviewed.  Grade 1 spondylolisthesis  at L4-5 is present with significant degenerative disc disease at L5-S1.   Facet arthritis also pronounced at these levels.  No compression fracture.   X-ray cervical spine: neg  PATIENT SURVEYS:  FOTO primary score 15% with goal of 37%  03/23/23:42%  COGNITION: Overall cognitive status: Within functional limits for tasks assessed     SENSATION: N&T 4th and 5th fingers  R/L    MUSCLE LENGTH: Hamstrings: limited bilaterally   POSTURE: decreased lumbar lordosis  PALPATION: TTP throughout upper and lower back, glutes and with patellar mobs.  LOWER EXTREMITY ROM:   All movements limited by pain Active ROM Right eval Left eval  Hip flexion    Hip extension    Hip abduction    Hip adduction    Hip internal rotation    Hip external rotation    Knee flexion 86 90  Knee extension 30 35  Ankle dorsiflexion 0 0  Ankle plantarflexion    Ankle inversion    Ankle eversion     (Blank rows = not tested)  LOWER EXTREMITY MMT:  MMT Right eval Left eval  Hip flexion 17.7 16.2  Hip extension    Hip abduction 14.0 22.7  Hip adduction    Hip internal rotation    Hip external rotation    Knee flexion    Knee extension 15.4 10.5  Ankle dorsiflexion    Ankle plantarflexion    Ankle inversion    Ankle eversion     (Blank rows = not tested)  LOWER EXTREMITY SPECIAL TESTS:  Unable to toelrate  FUNCTIONAL TESTS:  5 times sit to stand: not tolerated Timed up and go (TUG): 34.33 3 minute walk test: ran out of time.  Staged balance:0/4  03/23/23: TUG: 27.53s with cane  SLS at 58ft 6": LLE 3.74s; RLE 5s  GAIT: Distance walked: 557ft Assistive device utilized: Environmental consultant - 4 wheeled Level of assistance: Complete Independence Comments: rollator, decreased hip/knee flex, just clearing feet from floor, minimal heel strike and toe off, Posture guarded/antalgic    TODAY'S TREATMENT:                                                                                                                              TUG Reviewed gait with cane in proper hand (RUE); adjusted height of cane to level of wrist (was too hight)  Pt seen for aquatic therapy today.  Treatment took place in water 3.5-4.75 ft in depth at the [facility] pool. Temp of water was 91.  Pt entered/exited the pool via stairs and step to pattern with bilateral hand rail.  *UE  unsupported forward and backward x 3 laps with reciprocal arm swing; side stepping R/L  * straddling noodle and cycling (additional noodle under arms) * SLS in 23ft 6" area without UE support x 2 reps each LE (up to 3-5 sec)  * STS at bench in  water, feet on ground, with UE on yellow noodle - x ~8 with cues for forward arm reach, core engaged, nose over toes and slow controlled motion when returning to seated position * return to walking forward/ backward in deepest water    Pt requires the buoyancy and hydrostatic pressure of water for support, and to offload joints by unweighting joint load by at least 50 % in navel deep water and by at least 75-80% in chest to neck deep water.  Viscosity of the water is needed for resistance of strengthening. Water current perturbations provides challenge to standing balance requiring increased core activation.   PATIENT EDUCATION:  Education details: aquatic program progression Person educated: Patient Education method: Explanation Education comprehension: verbalized understanding  HOME EXERCISE PROGRAM: TBA  ASSESSMENT:  CLINICAL IMPRESSION: FOTO score improved to 42%; has met this LTG.  Pt has met STG #2 and 6 and is making good progress towards remaining STGS.  He reported less  tightening of lower back with side stepping today.  He was challenged with STS exercise at bench.  He has difficulty with forward weight shift prior to upward propulsion. Pt reported moderate fatigue after this.  Pt is making good gains towards all goals.   He will benefit from skilled physical therapy with intervention aquatic based for at least the first certification with goals to decrease pain allowing for improved movement patterns and strength.  Will transition to land based when approp.  OBJECTIVE IMPAIRMENTS: Abnormal gait, decreased activity tolerance, decreased balance, decreased endurance, decreased mobility, difficulty walking, decreased ROM, decreased strength,  impaired flexibility, impaired sensation, improper body mechanics, and pain.   ACTIVITY LIMITATIONS: carrying, lifting, bending, sitting, standing, squatting, stairs, transfers, bed mobility, continence, bathing, toileting, and locomotion level  PARTICIPATION LIMITATIONS: meal prep, cleaning, laundry, driving, shopping, community activity, occupation, and yard work  PERSONAL FACTORS: Age, Fitness, Profession, Time since onset of injury/illness/exacerbation, and multiple orthopedic/LB dysfunctions  are also affecting patient's functional outcome.   REHAB POTENTIAL: Good  CLINICAL DECISION MAKING: Unstable/unpredictable  EVALUATION COMPLEXITY: High   GOALS: Goals reviewed with patient? Yes  SHORT TERM GOALS: Target date: 04/14/23 Pt will tolerate full aquatic sessions consistently without increase in pain and with improving function to demonstrate good toleration and effectiveness of intervention.  Baseline: able to tolerate full session, but occasion increase in pain Goal status: Partially met -  03/23/23  2.  Pt will tolerate walking to or from setting along with tolerating full aquatic session without significant increase in pain or fatigue Baseline:  Goal status: MET -03/23/23  3.  Pt will complete SLS in 3.6 ft holding R/L x 20s to demonstrate improving balance Baseline: unable to complete Land based at eval;  see above  Goal status: INITIAL  4.  Pt will complete STS from bench onto pool floor x 10 continuously with ue support on foam and gaining immediate standing balance indep. Baseline:  Goal status: INITIAL  5.  Pt will improve on Tug test to <or=  25s to demonstrate improvement in lower extremity function, mobility and decreased fall risk. Baseline: 34s at eval; 27.53  Goal status: In progress - 03/23/23  6.  Pt will report fewer waking episodes nightly due to pain to demonstrate improved management of chronic pain Baseline: >5x nightly; 2-3x Goal status: MET 03/23/23    LONG TERM GOALS: Target date: 04/21/23  Pt to meet stated Foto Goal of 37% to demonstrate improved functional status Baseline: 15% at eval;  42%  Goal status: MET -  03/23/23  2.  Pt will improve strength in all areas listed by at least 20lbs to demonstrate improved overall physical function Baseline: see chart Goal status: INITIAL  3.  Pt will amb up to or > 250 ft in 3 minute walk test to demonstrate improved toleration to amb Baseline: pending completion Goal status: INITIAL  4.  Pt will report decrease in worst pain to </= 7/10 for improved toleration to activity/quality of life and to demonstrate improved management of pain.  Baseline:  Goal status: INITIAL  5.  Pt will improve in bilat knee ROM by at least 10d in both flex and extension Baseline:  Goal status: INITIAL     PLAN:  PT FREQUENCY: 1-2x/week  PT DURATION: 10 weeks 15 visits per [facility]  PLANNED INTERVENTIONS: Therapeutic exercises, Therapeutic activity, Neuromuscular re-education, Balance training, Gait training, Patient/Family education, Self Care, Joint mobilization, Stair training, Orthotic/Fit training, DME instructions, Aquatic Therapy, Dry Needling, Electrical stimulation, Cryotherapy, Moist heat, Taping, Ionotophoresis 4mg /ml Dexamethasone, Manual therapy, and Re-evaluation  PLAN FOR NEXT SESSION: Aquatic: strength, gait, stair climbing, STS, balance retraining, toleration to activity  Mayer Camel, PTA 03/23/23 6:12 PM [facility] [address] [place], [place], [address] Phone: [phone]   Fax:  [phone]

## 2023-03-24 DIAGNOSIS — M25561 Pain in right knee: Secondary | ICD-10-CM | POA: Diagnosis not present

## 2023-03-24 DIAGNOSIS — M25562 Pain in left knee: Secondary | ICD-10-CM | POA: Diagnosis not present

## 2023-03-27 ENCOUNTER — Ambulatory Visit (INDEPENDENT_AMBULATORY_CARE_PROVIDER_SITE_OTHER): Payer: No Typology Code available for payment source | Admitting: Internal Medicine

## 2023-03-27 ENCOUNTER — Encounter: Payer: Self-pay | Admitting: Internal Medicine

## 2023-03-27 VITALS — BP 145/84 | HR 52 | Resp 17 | Ht 72.0 in | Wt 250.0 lb

## 2023-03-27 DIAGNOSIS — M5410 Radiculopathy, site unspecified: Secondary | ICD-10-CM | POA: Insufficient documentation

## 2023-03-27 DIAGNOSIS — M25462 Effusion, left knee: Secondary | ICD-10-CM | POA: Diagnosis not present

## 2023-03-27 DIAGNOSIS — M1712 Unilateral primary osteoarthritis, left knee: Secondary | ICD-10-CM

## 2023-03-27 DIAGNOSIS — M545 Low back pain, unspecified: Secondary | ICD-10-CM

## 2023-03-27 DIAGNOSIS — M170 Bilateral primary osteoarthritis of knee: Secondary | ICD-10-CM | POA: Diagnosis not present

## 2023-03-27 DIAGNOSIS — M1A09X0 Idiopathic chronic gout, multiple sites, without tophus (tophi): Secondary | ICD-10-CM

## 2023-03-27 DIAGNOSIS — M1009 Idiopathic gout, multiple sites: Secondary | ICD-10-CM | POA: Insufficient documentation

## 2023-03-27 DIAGNOSIS — G8929 Other chronic pain: Secondary | ICD-10-CM

## 2023-03-27 DIAGNOSIS — M722 Plantar fascial fibromatosis: Secondary | ICD-10-CM

## 2023-03-27 LAB — SYNOVIAL FLUID ANALYSIS, COMPLETE
Basophils, %: 0 %
Eosinophils-Synovial: 0 % (ref 0–2)
Lymphocytes-Synovial Fld: 3 % (ref 0–74)
Monocyte/Macrophage: 9 % (ref 0–69)
Neutrophil, Synovial: 88 % — ABNORMAL HIGH (ref 0–24)
Synoviocytes, %: 0 % (ref 0–15)
WBC, Synovial: 20750 {cells}/uL — ABNORMAL HIGH (ref <150)

## 2023-03-27 MED ORDER — TRIAMCINOLONE ACETONIDE 40 MG/ML IJ SUSP
40.0000 mg | INTRAMUSCULAR | Status: AC | PRN
Start: 2023-03-27 — End: 2023-03-27
  Administered 2023-03-27: 40 mg via INTRA_ARTICULAR

## 2023-03-27 MED ORDER — LIDOCAINE HCL 1 % IJ SOLN
2.0000 mL | INTRAMUSCULAR | Status: AC | PRN
Start: 2023-03-27 — End: 2023-03-27
  Administered 2023-03-27: 2 mL

## 2023-03-27 NOTE — Patient Instructions (Signed)
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
## 2023-03-28 ENCOUNTER — Encounter (HOSPITAL_BASED_OUTPATIENT_CLINIC_OR_DEPARTMENT_OTHER): Payer: Self-pay | Admitting: Physical Therapy

## 2023-03-28 ENCOUNTER — Ambulatory Visit (HOSPITAL_BASED_OUTPATIENT_CLINIC_OR_DEPARTMENT_OTHER): Payer: No Typology Code available for payment source | Admitting: Physical Therapy

## 2023-03-28 DIAGNOSIS — M6281 Muscle weakness (generalized): Secondary | ICD-10-CM

## 2023-03-28 DIAGNOSIS — M5459 Other low back pain: Secondary | ICD-10-CM

## 2023-03-28 DIAGNOSIS — G8929 Other chronic pain: Secondary | ICD-10-CM

## 2023-03-28 DIAGNOSIS — G894 Chronic pain syndrome: Secondary | ICD-10-CM

## 2023-03-28 DIAGNOSIS — R2689 Other abnormalities of gait and mobility: Secondary | ICD-10-CM

## 2023-03-28 DIAGNOSIS — M25561 Pain in right knee: Secondary | ICD-10-CM | POA: Diagnosis not present

## 2023-03-28 LAB — URIC ACID: Uric Acid, Serum: 5.7 mg/dL (ref 4.0–8.0)

## 2023-03-28 LAB — BASIC METABOLIC PANEL WITH GFR
BUN: 10 mg/dL (ref 7–25)
CO2: 28 mmol/L (ref 20–32)
Calcium: 9.9 mg/dL (ref 8.6–10.3)
Chloride: 106 mmol/L (ref 98–110)
Creat: 1.22 mg/dL (ref 0.70–1.35)
Glucose, Bld: 117 mg/dL — ABNORMAL HIGH (ref 65–99)
Potassium: 4.7 mmol/L (ref 3.5–5.3)
Sodium: 140 mmol/L (ref 135–146)
eGFR: 68 mL/min/{1.73_m2} (ref >=60)

## 2023-03-28 LAB — RHEUMATOID FACTOR: Rheumatoid fact SerPl-aCnc: <10 [IU]/mL (ref <14)

## 2023-03-28 LAB — SEDIMENTATION RATE: Sed Rate: 22 mm/h — ABNORMAL HIGH (ref 0–20)

## 2023-03-28 NOTE — Therapy (Signed)
OUTPATIENT PHYSICAL THERAPY LOWER EXTREMITY TREATMENT   Patient Name: William Savage MRN: [id number] DOB:July 03, 1960, 62 y.o., male Today's Date: 03/28/2023  END OF SESSION:  PT End of Session - 03/28/23 1040     Visit Number 8    Number of Visits 15    Date for PT Re-Evaluation 04/21/23    Authorization Type Department of [facility]  15 visits approved from   12/24/22-04/23/23  [id number]  [facility]: [phone] William Savage    PT Start Time 1032    PT Stop Time 1115    PT Time Calculation (min) 43 min    Activity Tolerance Patient tolerated treatment well    Behavior During Therapy [facility] for tasks assessed/performed              Past Medical History:  Diagnosis Date   Hyperlipidemia    Hypertension    Migraines    Nonobstructive atherosclerosis of coronary artery    Radiculopathy    Sleep apnea    TIA (transient ischemic attack)    Past Surgical History:  Procedure Laterality Date   BUNIONECTOMY Right    COLONOSCOPY  2021   Patient Active Problem List   Diagnosis Date Noted   Effusion, left knee 03/27/2023   Idiopathic gout of multiple sites 03/27/2023   Radiculitis 03/27/2023   Chronic low back pain 03/27/2023   Osteoarthritis of left knee 03/27/2023   Bilateral plantar fasciitis 03/27/2023    PCP: none  REFERRING PROVIDER: Vivi Barrack, DPM / Darlyn Chamber MD  REFERRING DIAG: - Chronic pain of both knees ; Chronic pain syndrome   THERAPY DIAG:  Chronic pain of both knees  Other low back pain  Muscle weakness-general  Other abnormalities of gait and mobility  Chronic pain syndrome  Rationale for Evaluation and Treatment: Rehabilitation  ONSET DATE: >10 yrs  SUBJECTIVE:   SUBJECTIVE STATEMENT: Pt reports increase in left knee pain after last aqautic session.  Went to urgent care then saw rheumatologist and ortho yesterday.  Is on a steriod pack, had left knee drained and is now scheduled for a gel shot in Nov.  PERTINENT  HISTORY: 01/16/23 referral from MW:[email or web address] pain of both knees  Department of [facility] 15 visits approved from  12/24/22-04/23/23 [id number] [facility]: [phone] William Savage  Referring Provider: Darlyn Chamber   01/03/23 referral from OV:FIEPPIR pain syndrome   11/15/22:ankle instability, plantar fasciitis, chronic foot pain  PAIN:  Are you having pain? Yes: NPRS scale: current 7/10 Lt knee;  6/10 Lt shoulder to 4th -5th finger; back 7//10 Pain location: see above  Pain description: radiating, achy, dull, stiffness Aggravating factors: movement, sitting x 15 mins, standing/walking 10 mins; driving Relieving factors: sleep, medication, massage(monthly)    PRECAUTIONS: Fall  RED FLAGS: None   WEIGHT BEARING RESTRICTIONS: No  FALLS:  Has patient fallen in last 6 months? Yes. Number of falls 1 left ankle gave out  LIVING ENVIRONMENT: Lives with: lives with their spouse Lives in: House/apartment Stairs: Yes: Internal: 16 steps; on right going up Has following equipment at home: Single point cane, Walker - 4 wheeled, and shower chair  OCCUPATION: disabled  PLOF: Requires assistive device for independence  PATIENT GOALS: less pain, get stronger, walk with AD  NEXT MD VISIT: as needed  OBJECTIVE:   DIAGNOSTIC FINDINGS:  01/25/23: AP lateral merchant radiographs left knee reviewed.  Medial compartment  arthritis which is severe is present.  Bone-on-bone changes noted with  some spurring on the medial side.  Lateral patellofemoral compartments  have less significant degenerative changes present.  Alignment is slight  varus.   AP lateral merchant radiographs right knee reviewed.  Alignment intact.   Mild medial joint space narrowing is present.  No spurs in the medial  lateral or patellofemoral compartments.   AP lateral radiographs lumbar spine reviewed.  Grade 1 spondylolisthesis  at L4-5 is present with significant degenerative disc disease at L5-S1.   Facet  arthritis also pronounced at these levels.  No compression fracture.   X-ray cervical spine: neg  PATIENT SURVEYS:  FOTO primary score 15% with goal of 37%  03/23/23:42%  COGNITION: Overall cognitive status: Within functional limits for tasks assessed     SENSATION: N&T 4th and 5th fingers R/L    MUSCLE LENGTH: Hamstrings: limited bilaterally   POSTURE: decreased lumbar lordosis  PALPATION: TTP throughout upper and lower back, glutes and with patellar mobs.  LOWER EXTREMITY ROM:   All movements limited by pain Active ROM Right eval Left eval  Hip flexion    Hip extension    Hip abduction    Hip adduction    Hip internal rotation    Hip external rotation    Knee flexion 86 90  Knee extension 30 35  Ankle dorsiflexion 0 0  Ankle plantarflexion    Ankle inversion    Ankle eversion     (Blank rows = not tested)  LOWER EXTREMITY MMT:  MMT Right eval Left eval  Hip flexion 17.7 16.2  Hip extension    Hip abduction 14.0 22.7  Hip adduction    Hip internal rotation    Hip external rotation    Knee flexion    Knee extension 15.4 10.5  Ankle dorsiflexion    Ankle plantarflexion    Ankle inversion    Ankle eversion     (Blank rows = not tested)  LOWER EXTREMITY SPECIAL TESTS:  Unable to toelrate  FUNCTIONAL TESTS:  5 times sit to stand: not tolerated Timed up and go (TUG): 34.33 3 minute walk test: ran out of time.  Staged balance:0/4  03/23/23: TUG: 27.53s with cane  SLS at 70ft 6": LLE 3.74s; RLE 5s  GAIT: Distance walked: 541ft Assistive device utilized: Environmental consultant - 4 wheeled Level of assistance: Complete Independence Comments: rollator, decreased hip/knee flex, just clearing feet from floor, minimal heel strike and toe off, Posture guarded/antalgic    TODAY'S TREATMENT:                                                                                                                                Pt seen for aquatic therapy today.  Treatment took  place in water 3.5-4.75 ft in depth at the [facility] pool. Temp of water was 91.  Pt entered/exited the pool via stairs and step to pattern with bilateral hand rail.  *UE unsupported forward and backward x 3 laps with reciprocal arm swing; side stepping R/L  * straddling  noodle and cycling (additional noodle under arms) *high knee marching 2 lengths *Standing 4.6 ft: ue support on noodle: squats 2 x 8 *Solid noodle stomp R/L 2 x 10 *Tandem stance 4.0 ft ue support on noodle  *walking forward and back for recovery   Pt requires the buoyancy and hydrostatic pressure of water for support, and to offload joints by unweighting joint load by at least 50 % in navel deep water and by at least 75-80% in chest to neck deep water.  Viscosity of the water is needed for resistance of strengthening. Water current perturbations provides challenge to standing balance requiring increased core activation.   PATIENT EDUCATION:  Education details: aquatic program progression Person educated: Patient Education method: Explanation Education comprehension: verbalized understanding  HOME EXERCISE PROGRAM: TBA  ASSESSMENT:  CLINICAL IMPRESSION: Pt reports multiple vials of fluid drained off of left knee yesterday.  Reports some reduction in pain since. Has not yet decided on the gel shots but does have an appt scheduled. Dialed back on the STS and SLS (shallow) today as pt reports spike in pain after last session.  He did tolerated balance challenges slightly more submerged.  He tolerates addition of noodle stop (reverse knee press) with good eccentric as well as concentric control without pain. He does reports a reduction in left knee pain upon completion 5/10. He will continue to benefit from skilled physical therapy with intervention aquatic based and Will transition to land based when approp. Goals ongoing     OBJECTIVE IMPAIRMENTS: Abnormal gait, decreased activity tolerance, decreased  balance, decreased endurance, decreased mobility, difficulty walking, decreased ROM, decreased strength, impaired flexibility, impaired sensation, improper body mechanics, and pain.   ACTIVITY LIMITATIONS: carrying, lifting, bending, sitting, standing, squatting, stairs, transfers, bed mobility, continence, bathing, toileting, and locomotion level  PARTICIPATION LIMITATIONS: meal prep, cleaning, laundry, driving, shopping, community activity, occupation, and yard work  PERSONAL FACTORS: Age, Fitness, Profession, Time since onset of injury/illness/exacerbation, and multiple orthopedic/LB dysfunctions  are also affecting patient's functional outcome.   REHAB POTENTIAL: Good  CLINICAL DECISION MAKING: Unstable/unpredictable  EVALUATION COMPLEXITY: High   GOALS: Goals reviewed with patient? Yes  SHORT TERM GOALS: Target date: 04/14/23 Pt will tolerate full aquatic sessions consistently without increase in pain and with improving function to demonstrate good toleration and effectiveness of intervention.  Baseline: able to tolerate full session, but occasion increase in pain Goal status: Partially met -  03/23/23  2.  Pt will tolerate walking to or from setting along with tolerating full aquatic session without significant increase in pain or fatigue Baseline:  Goal status: MET -03/23/23  3.  Pt will complete SLS in 3.6 ft holding R/L x 20s to demonstrate improving balance Baseline: unable to complete Land based at eval;  see above  Goal status: INITIAL  4.  Pt will complete STS from bench onto pool floor x 10 continuously with ue support on foam and gaining immediate standing balance indep. Baseline:  Goal status: INITIAL  5.  Pt will improve on Tug test to <or=  25s to demonstrate improvement in lower extremity function, mobility and decreased fall risk. Baseline: 34s at eval; 27.53  Goal status: In progress - 03/23/23  6.  Pt will report fewer waking episodes nightly due to pain to  demonstrate improved management of chronic pain Baseline: >5x nightly; 2-3x Goal status: MET 03/23/23   LONG TERM GOALS: Target date: 04/21/23  Pt to meet stated Foto Goal of 37% to demonstrate improved functional status Baseline: 15% at  eval;  42%  Goal status: MET - 03/23/23  2.  Pt will improve strength in all areas listed by at least 20lbs to demonstrate improved overall physical function Baseline: see chart Goal status: INITIAL  3.  Pt will amb up to or > 250 ft in 3 minute walk test to demonstrate improved toleration to amb Baseline: pending completion Goal status: INITIAL  4.  Pt will report decrease in worst pain to </= 7/10 for improved toleration to activity/quality of life and to demonstrate improved management of pain.  Baseline:  Goal status: INITIAL  5.  Pt will improve in bilat knee ROM by at least 10d in both flex and extension Baseline:  Goal status: INITIAL     PLAN:  PT FREQUENCY: 1-2x/week  PT DURATION: 10 weeks 15 visits per [facility]  PLANNED INTERVENTIONS: Therapeutic exercises, Therapeutic activity, Neuromuscular re-education, Balance training, Gait training, Patient/Family education, Self Care, Joint mobilization, Stair training, Orthotic/Fit training, DME instructions, Aquatic Therapy, Dry Needling, Electrical stimulation, Cryotherapy, Moist heat, Taping, Ionotophoresis 4mg /ml Dexamethasone, Manual therapy, and Re-evaluation  PLAN FOR NEXT SESSION: Aquatic: strength, gait, stair climbing, STS, balance retraining, toleration to activity Corrie Dandy Tomma Lightning) Torah Pinnock MPT 03/28/23 10:41 AM [facility] [address] [place], [place], [address] Phone: [phone]   Fax:  [phone]

## 2023-03-30 ENCOUNTER — Encounter (HOSPITAL_BASED_OUTPATIENT_CLINIC_OR_DEPARTMENT_OTHER): Payer: Self-pay | Admitting: Physical Therapy

## 2023-03-30 ENCOUNTER — Ambulatory Visit (HOSPITAL_BASED_OUTPATIENT_CLINIC_OR_DEPARTMENT_OTHER): Payer: No Typology Code available for payment source | Admitting: Physical Therapy

## 2023-03-30 DIAGNOSIS — G8929 Other chronic pain: Secondary | ICD-10-CM

## 2023-03-30 DIAGNOSIS — M6281 Muscle weakness (generalized): Secondary | ICD-10-CM

## 2023-03-30 DIAGNOSIS — M25561 Pain in right knee: Secondary | ICD-10-CM | POA: Diagnosis not present

## 2023-03-30 DIAGNOSIS — M5459 Other low back pain: Secondary | ICD-10-CM

## 2023-03-30 DIAGNOSIS — G894 Chronic pain syndrome: Secondary | ICD-10-CM

## 2023-03-30 NOTE — Therapy (Signed)
OUTPATIENT PHYSICAL THERAPY LOWER EXTREMITY TREATMENT   Patient Name: William Savage MRN: [id number] DOB:1960-08-31, 62 y.o., male Today's Date: 03/30/2023  END OF SESSION:  PT End of Session - 03/30/23 1041     Visit Number 9    Number of Visits 15    Date for PT Re-Evaluation 04/21/23    Authorization Type Department of [facility]  15 visits approved from   12/24/22-04/23/23  [id number]  [facility]: [phone] [name]    PT Start Time 1134    PT Stop Time 1215    PT Time Calculation (min) 41 min    Activity Tolerance Patient tolerated treatment well    Behavior During Therapy [facility] for tasks assessed/performed               Past Medical History:  Diagnosis Date   Hyperlipidemia    Hypertension    Migraines    Nonobstructive atherosclerosis of coronary artery    Radiculopathy    Sleep apnea    TIA (transient ischemic attack)    Past Surgical History:  Procedure Laterality Date   BUNIONECTOMY Right    COLONOSCOPY  2021   Patient Active Problem List   Diagnosis Date Noted   Effusion, left knee 03/27/2023   Idiopathic gout of multiple sites 03/27/2023   Radiculitis 03/27/2023   Chronic low back pain 03/27/2023   Osteoarthritis of left knee 03/27/2023   Bilateral plantar fasciitis 03/27/2023    PCP: none  REFERRING PROVIDER: Vivi Barrack, DPM / Darlyn Chamber MD  REFERRING DIAG: - Chronic pain of both knees ; Chronic pain syndrome   THERAPY DIAG:  Chronic pain of both knees  Other low back pain  Muscle weakness-general  Chronic pain syndrome  Rationale for Evaluation and Treatment: Rehabilitation  ONSET DATE: >10 yrs  SUBJECTIVE:   SUBJECTIVE STATEMENT: Pt complains of left foot pain today flared last night.  Left knee is "tender" but felt better after last aqautic session.  PERTINENT HISTORY: 01/16/23 referral from HQ:[email or web address] pain of both knees  Department of [facility] 15 visits approved from   12/24/22-04/23/23 [id number] [facility]: [phone] [name]  Referring Provider: Darlyn Chamber   01/03/23 referral from [id number] pain syndrome   11/15/22:ankle instability, plantar fasciitis, chronic foot pain  PAIN:  Are you having pain? Yes: NPRS scale: current 7/10 Lt knee;  6/10 Lt shoulder to 4th -5th finger; back 7//10 Pain location: see above  Pain description: radiating, achy, dull, stiffness Aggravating factors: movement, sitting x 15 mins, standing/walking 10 mins; driving Relieving factors: sleep, medication, massage(monthly)    PRECAUTIONS: Fall  RED FLAGS: None   WEIGHT BEARING RESTRICTIONS: No  FALLS:  Has patient fallen in last 6 months? Yes. Number of falls 1 left ankle gave out  LIVING ENVIRONMENT: Lives with: lives with their spouse Lives in: House/apartment Stairs: Yes: Internal: 16 steps; on right going up Has following equipment at home: Single point cane, Walker - 4 wheeled, and shower chair  OCCUPATION: disabled  PLOF: Requires assistive device for independence  PATIENT GOALS: less pain, get stronger, walk with AD  NEXT MD VISIT: as needed  OBJECTIVE:   DIAGNOSTIC FINDINGS:  01/25/23: AP lateral merchant radiographs left knee reviewed.  Medial compartment  arthritis which is severe is present.  Bone-on-bone changes noted with  some spurring on the medial side.  Lateral patellofemoral compartments  have less significant degenerative changes present.  Alignment is slight  varus.   AP lateral merchant radiographs right knee reviewed.  Alignment intact.   Mild medial joint space narrowing is present.  No spurs in the medial  lateral or patellofemoral compartments.   AP lateral radiographs lumbar spine reviewed.  Grade 1 spondylolisthesis  at L4-5 is present with significant degenerative disc disease at L5-S1.   Facet arthritis also pronounced at these levels.  No compression fracture.   X-ray cervical spine: neg  PATIENT SURVEYS:   FOTO primary score 15% with goal of 37%  03/23/23:42%  COGNITION: Overall cognitive status: Within functional limits for tasks assessed     SENSATION: N&T 4th and 5th fingers R/L    MUSCLE LENGTH: Hamstrings: limited bilaterally   POSTURE: decreased lumbar lordosis  PALPATION: TTP throughout upper and lower back, glutes and with patellar mobs.  LOWER EXTREMITY ROM:   All movements limited by pain Active ROM Right eval Left eval  Hip flexion    Hip extension    Hip abduction    Hip adduction    Hip internal rotation    Hip external rotation    Knee flexion 86 90  Knee extension 30 35  Ankle dorsiflexion 0 0  Ankle plantarflexion    Ankle inversion    Ankle eversion     (Blank rows = not tested)  LOWER EXTREMITY MMT:  MMT Right eval Left eval  Hip flexion 17.7 16.2  Hip extension    Hip abduction 14.0 22.7  Hip adduction    Hip internal rotation    Hip external rotation    Knee flexion    Knee extension 15.4 10.5  Ankle dorsiflexion    Ankle plantarflexion    Ankle inversion    Ankle eversion     (Blank rows = not tested)  LOWER EXTREMITY SPECIAL TESTS:  Unable to toelrate  FUNCTIONAL TESTS:  5 times sit to stand: not tolerated Timed up and go (TUG): 34.33 3 minute walk test: ran out of time.  Staged balance:0/4  03/23/23: TUG: 27.53s with cane  SLS at 4ft 6": LLE 3.74s; RLE 5s  GAIT: Distance walked: 591ft Assistive device utilized: Environmental consultant - 4 wheeled Level of assistance: Complete Independence Comments: rollator, decreased hip/knee flex, just clearing feet from floor, minimal heel strike and toe off, Posture guarded/antalgic    TODAY'S TREATMENT:                                                                                                                                Pt seen for aquatic therapy today.  Treatment took place in water 3.5-4.75 ft in depth at the [facility] pool. Temp of water was 91.  Pt entered/exited the pool  via stairs and step to pattern with bilateral hand rail.  *UE unsupported forward and backward multiple laps in 4.8 ft with reciprocal arm swing; side stepping R/L  *decompression on noodle with cycling *SLS: not tolerated lle due to foot pain completed in 4.24ft RLE best 6s *Solid noodle stomp R/L hip in neutral  then external rotation *decompression on noodle: cycling; hip add/abd; hip flex/ext *walking forward and back for recovery   Pt requires the buoyancy and hydrostatic pressure of water for support, and to offload joints by unweighting joint load by at least 50 % in navel deep water and by at least 75-80% in chest to neck deep water.  Viscosity of the water is needed for resistance of strengthening. Water current perturbations provides challenge to standing balance requiring increased core activation.   PATIENT EDUCATION:  Education details: aquatic program progression Person educated: Patient Education method: Explanation Education comprehension: verbalized understanding  HOME EXERCISE PROGRAM: TBA  ASSESSMENT:  CLINICAL IMPRESSION: Pt reports varying level of pain responses post aquatic sessions.He does report good toleration to last session where we dialed back the intensity.  He is limited today due to left foot pain impeding his ability to tolerate all activities requiring weight bearing even low load environment in 4.8 ft. Does tolerate unloaded exercises well which is where we focused.  Goals ongoing.   OBJECTIVE IMPAIRMENTS: Abnormal gait, decreased activity tolerance, decreased balance, decreased endurance, decreased mobility, difficulty walking, decreased ROM, decreased strength, impaired flexibility, impaired sensation, improper body mechanics, and pain.   ACTIVITY LIMITATIONS: carrying, lifting, bending, sitting, standing, squatting, stairs, transfers, bed mobility, continence, bathing, toileting, and locomotion level  PARTICIPATION LIMITATIONS: meal prep, cleaning,  laundry, driving, shopping, community activity, occupation, and yard work  PERSONAL FACTORS: Age, Fitness, Profession, Time since onset of injury/illness/exacerbation, and multiple orthopedic/LB dysfunctions  are also affecting patient's functional outcome.   REHAB POTENTIAL: Good  CLINICAL DECISION MAKING: Unstable/unpredictable  EVALUATION COMPLEXITY: High   GOALS: Goals reviewed with patient? Yes  SHORT TERM GOALS: Target date: 04/14/23 Pt will tolerate full aquatic sessions consistently without increase in pain and with improving function to demonstrate good toleration and effectiveness of intervention.  Baseline: able to tolerate full session, but occasion increase in pain Goal status: Partially met -  03/23/23  2.  Pt will tolerate walking to or from setting along with tolerating full aquatic session without significant increase in pain or fatigue Baseline:  Goal status: MET -03/23/23  3.  Pt will complete SLS in 3.6 ft holding R/L x 20s to demonstrate improving balance Baseline: unable to complete Land based at eval;  see above  Goal status: INITIAL  4.  Pt will complete STS from bench onto pool floor x 10 continuously with ue support on foam and gaining immediate standing balance indep. Baseline:  Goal status: INITIAL  5.  Pt will improve on Tug test to <or=  25s to demonstrate improvement in lower extremity function, mobility and decreased fall risk. Baseline: 34s at eval; 27.53  Goal status: In progress - 03/23/23  6.  Pt will report fewer waking episodes nightly due to pain to demonstrate improved management of chronic pain Baseline: >5x nightly; 2-3x Goal status: MET 03/23/23   LONG TERM GOALS: Target date: 04/21/23  Pt to meet stated Foto Goal of 37% to demonstrate improved functional status Baseline: 15% at eval;  42%  Goal status: MET - 03/23/23  2.  Pt will improve strength in all areas listed by at least 20lbs to demonstrate improved overall physical  function Baseline: see chart Goal status: INITIAL  3.  Pt will amb up to or > 250 ft in 3 minute walk test to demonstrate improved toleration to amb Baseline: pending completion Goal status: INITIAL  4.  Pt will report decrease in worst pain to </= 7/10 for improved toleration to  activity/quality of life and to demonstrate improved management of pain.  Baseline:  Goal status: INITIAL  5.  Pt will improve in bilat knee ROM by at least 10d in both flex and extension Baseline:  Goal status: INITIAL     PLAN:  PT FREQUENCY: 1-2x/week  PT DURATION: 10 weeks 15 visits per [facility]  PLANNED INTERVENTIONS: Therapeutic exercises, Therapeutic activity, Neuromuscular re-education, Balance training, Gait training, Patient/Family education, Self Care, Joint mobilization, Stair training, Orthotic/Fit training, DME instructions, Aquatic Therapy, Dry Needling, Electrical stimulation, Cryotherapy, Moist heat, Taping, Ionotophoresis 4mg /ml Dexamethasone, Manual therapy, and Re-evaluation  PLAN FOR NEXT SESSION: Aquatic: strength, gait, stair climbing, STS, balance retraining, toleration to activity  Corrie Dandy Tomma Lightning) Daira Hine MPT 03/30/23 10:42 AM [facility] [address] [place], [place], [address] Phone: [phone]   Fax:  [phone]

## 2023-03-31 ENCOUNTER — Other Ambulatory Visit: Payer: Self-pay | Admitting: Podiatry

## 2023-04-04 ENCOUNTER — Encounter (HOSPITAL_BASED_OUTPATIENT_CLINIC_OR_DEPARTMENT_OTHER): Payer: Self-pay | Admitting: Physical Therapy

## 2023-04-04 ENCOUNTER — Ambulatory Visit (HOSPITAL_BASED_OUTPATIENT_CLINIC_OR_DEPARTMENT_OTHER): Payer: No Typology Code available for payment source | Admitting: Physical Therapy

## 2023-04-04 DIAGNOSIS — M6281 Muscle weakness (generalized): Secondary | ICD-10-CM

## 2023-04-04 DIAGNOSIS — G8929 Other chronic pain: Secondary | ICD-10-CM

## 2023-04-04 DIAGNOSIS — M25561 Pain in right knee: Secondary | ICD-10-CM | POA: Diagnosis not present

## 2023-04-04 DIAGNOSIS — M5459 Other low back pain: Secondary | ICD-10-CM

## 2023-04-04 DIAGNOSIS — G894 Chronic pain syndrome: Secondary | ICD-10-CM

## 2023-04-04 NOTE — Therapy (Signed)
OUTPATIENT PHYSICAL THERAPY LOWER EXTREMITY TREATMENT  Progress Note Reporting Period 02/08/23 to 04/04/23  See note below for Objective Data and Assessment of Progress/Goals.     Patient Name: William Savage MRN: [id number] DOB:[birth year], 62 y.o., male Today's Date: 04/04/2023  END OF SESSION:  PT End of Session - 04/04/23 1034     Visit Number 10    Number of Visits 15    Date for PT Re-Evaluation 04/21/23    Authorization Type Department of [facility]  15 visits approved from   12/24/22-04/23/23  [id number]  [facility]: [phone] [name]    PT Start Time 1033    PT Stop Time 1115    PT Time Calculation (min) 42 min    Activity Tolerance Patient tolerated treatment well    Behavior During Therapy [facility] for tasks assessed/performed               Past Medical History:  Diagnosis Date   Hyperlipidemia    Hypertension    Migraines    Nonobstructive atherosclerosis of coronary artery    Radiculopathy    Sleep apnea    TIA (transient ischemic attack)    Past Surgical History:  Procedure Laterality Date   BUNIONECTOMY Right    COLONOSCOPY  2021   Patient Active Problem List   Diagnosis Date Noted   Effusion, left knee 03/27/2023   Idiopathic gout of multiple sites 03/27/2023   Radiculitis 03/27/2023   Chronic low back pain 03/27/2023   Osteoarthritis of left knee 03/27/2023   Bilateral plantar fasciitis 03/27/2023    PCP: none  REFERRING PROVIDER: Vivi Barrack, DPM / Darlyn Chamber MD  REFERRING DIAG: - Chronic pain of both knees ; Chronic pain syndrome   THERAPY DIAG:  Chronic pain of both knees  Other low back pain  Muscle weakness-general  Chronic pain syndrome  Rationale for Evaluation and Treatment: Rehabilitation  ONSET DATE: >10 yrs  SUBJECTIVE:   SUBJECTIVE STATEMENT: "I feel it has helped my strength, it has helped the joints the muscles and I feel more stable. I have decided to have the gel shots.  I get them in  Nov."  Pt complains of left foot pain today flared last night.  Left knee is "tender" but felt better after last aqautic session.  PERTINENT HISTORY: 01/16/23 referral from HQ:IONGEXB pain of both knees  Department of [facility] 15 visits approved from  12/24/22-04/23/23 [id number] [facility]: [phone] [name]  Referring Provider: Darlyn Chamber   01/03/23 referral from KV:[id number] pain syndrome   11/15/22:ankle instability, plantar fasciitis, chronic foot pain  PAIN:  Are you having pain? Yes: NPRS scale: current 7/10 Lt knee;  6/10 Lt shoulder to 4th -5th finger; back 7//10 Pain location: see above  Pain description: radiating, achy, dull, stiffness Aggravating factors: movement, sitting x 15 mins, standing/walking 10 mins; driving Relieving factors: sleep, medication, massage(monthly)    PRECAUTIONS: Fall  RED FLAGS: None   WEIGHT BEARING RESTRICTIONS: No  FALLS:  Has patient fallen in last 6 months? Yes. Number of falls 1 left ankle gave out  LIVING ENVIRONMENT: Lives with: lives with their spouse Lives in: House/apartment Stairs: Yes: Internal: 16 steps; on right going up Has following equipment at home: Single point cane, Walker - 4 wheeled, and shower chair  OCCUPATION: disabled  PLOF: Requires assistive device for independence  PATIENT GOALS: less pain, get stronger, walk with AD  NEXT MD VISIT: as needed  OBJECTIVE:   DIAGNOSTIC FINDINGS:  01/25/23: AP  lateral merchant radiographs left knee reviewed.  Medial compartment  arthritis which is severe is present.  Bone-on-bone changes noted with  some spurring on the medial side.  Lateral patellofemoral compartments  have less significant degenerative changes present.  Alignment is slight  varus.   AP lateral merchant radiographs right knee reviewed.  Alignment intact.   Mild medial joint space narrowing is present.  No spurs in the medial  lateral or patellofemoral compartments.   AP lateral  radiographs lumbar spine reviewed.  Grade 1 spondylolisthesis  at L4-5 is present with significant degenerative disc disease at L5-S1.   Facet arthritis also pronounced at these levels.  No compression fracture.   X-ray cervical spine: neg  PATIENT SURVEYS:  FOTO primary score 15% with goal of 37%  03/23/23:42%  COGNITION: Overall cognitive status: Within functional limits for tasks assessed     SENSATION: N&T 4th and 5th fingers R/L    MUSCLE LENGTH: Hamstrings: limited bilaterally   POSTURE: decreased lumbar lordosis  PALPATION: TTP throughout upper and lower back, glutes and with patellar mobs.  LOWER EXTREMITY ROM:   All movements limited by pain Active ROM Right eval Left eval Right / Left 04/04/23  Hip flexion     Hip extension     Hip abduction     Hip adduction     Hip internal rotation     Hip external rotation     Knee flexion 86 90 100 / 102  Knee extension -30 active -35 active -20 / -40 active -5 /-8 AA   Ankle dorsiflexion 0 0   Ankle plantarflexion     Ankle inversion     Ankle eversion      (Blank rows = not tested)  LOWER EXTREMITY MMT:   Pt limited by pain with all movements MMT Right eval Left eval R / L 04/04/23  Hip flexion 17.7 16.2 27.0 / 24.9  Hip extension     Hip abduction 14.0 22.7 19.0 / 14.5  Hip adduction     Hip internal rotation     Hip external rotation     Knee flexion     Knee extension 15.4 10.5 17.1 / 12.1  Ankle dorsiflexion     Ankle plantarflexion     Ankle inversion     Ankle eversion      (Blank rows = not tested)  LOWER EXTREMITY SPECIAL TESTS:  Unable to toelrate  FUNCTIONAL TESTS:  5 times sit to stand: not tolerated Timed up and go (TUG): 34.33 3 minute walk test: ran out of time.  Staged balance:0/4  03/23/23: TUG: 27.53s with cane  SLS at 34ft 6": LLE 3.74s; RLE 5s  GAIT: Distance walked: 520ft Assistive device utilized: Environmental consultant - 4 wheeled Level of assistance: Complete  Independence Comments: rollator, decreased hip/knee flex, just clearing feet from floor, minimal heel strike and toe off, Posture guarded/antalgic    TODAY'S TREATMENT:  Strength and ROM testing  Pt seen for aquatic therapy today.  Treatment took place in water 3.5-4.75 ft in depth at the [facility] pool. Temp of water was 91.  Pt entered/exited the pool via stairs and step to pattern with bilateral hand rail.  *UE unsupported forward and backward multiple laps in 4.8 ft with reciprocal arm swing; side stepping R/L  *Solid noodle stomp R/L hip in neutral then external rotation *SLS: completed in 4.50ft RLE best 6s; Left x 3 s.  Limited due to knee pain *STS from bench with ue support white barbell. Pt requires rocking motion for momentum to rise.  Completes x 10    Pt requires the buoyancy and hydrostatic pressure of water for support, and to offload joints by unweighting joint load by at least 50 % in navel deep water and by at least 75-80% in chest to neck deep water.  Viscosity of the water is needed for resistance of strengthening. Water current perturbations provides challenge to standing balance requiring increased core activation.   PATIENT EDUCATION:  Education details: aquatic program progression Person educated: Patient Education method: Explanation Education comprehension: verbalized understanding  HOME EXERCISE PROGRAM: Access Code: [email or web address] URL: [email or web address] Date: 04/04/2023 Prepared by: Geni Bers  Exercises - Supine Quad Set  - 2 x daily - 7 x weekly - 1 sets - 10 reps - 5 sec hold - Seated Quad Set  - 2 x daily - 7 x weekly - 1 sets - 10 reps - 5 sec hold  ASSESSMENT:  CLINICAL IMPRESSION: PN:Pt demonstrates a general increase in le strength and ROM as indicated in above charts.  He has had a slight  decrease in active left knee extension which I attribute to his reports of sleeping with a pillow up under his knee nightly. He is cautioned against to avoid contracture. Pt instructed on quad sets to complete in sitting or supine.  Copy given. Will begin land based intervention to progress/compliment program.  Will plan on re-cert in 2 weeks.  Pt also encouraged to reach out to [place] to have certification extended. (Ends 11/3)  Left foot pain has subsided.  He has improved toleration to SLS and is able to complete 10 consecutive STS meeting STG#4.  He will continue to benefit from skilled PT intervention both aquatic and land based to progress toward maximum function, ease with ADL's and pain management.     OBJECTIVE IMPAIRMENTS: Abnormal gait, decreased activity tolerance, decreased balance, decreased endurance, decreased mobility, difficulty walking, decreased ROM, decreased strength, impaired flexibility, impaired sensation, improper body mechanics, and pain.   ACTIVITY LIMITATIONS: carrying, lifting, bending, sitting, standing, squatting, stairs, transfers, bed mobility, continence, bathing, toileting, and locomotion level  PARTICIPATION LIMITATIONS: meal prep, cleaning, laundry, driving, shopping, community activity, occupation, and yard work  PERSONAL FACTORS: Age, Fitness, Profession, Time since onset of injury/illness/exacerbation, and multiple orthopedic/LB dysfunctions  are also affecting patient's functional outcome.   REHAB POTENTIAL: Good  CLINICAL DECISION MAKING: Unstable/unpredictable  EVALUATION COMPLEXITY: High   GOALS: Goals reviewed with patient? Yes  SHORT TERM GOALS: Target date: 04/14/23 Pt will tolerate full aquatic sessions consistently without increase in pain and with improving function to demonstrate good toleration and effectiveness of intervention.  Baseline: able to tolerate full session, but occasion increase in pain Goal status: Partially met -  03/23/23  2.   Pt will tolerate walking to or from setting along with tolerating full aquatic session without significant increase in pain or fatigue Baseline:  Goal status: MET -  03/23/23  3.  Pt will complete SLS in 3.6 ft holding R/L x 20s to demonstrate improving balance Baseline: unable to complete Land based at eval;  see above  Goal status: In Process 04/04/23  4.  Pt will complete STS from bench onto pool floor x 10 continuously with ue support on foam and gaining immediate standing balance indep. Baseline:  Goal status: Met 04/04/23  5.  Pt will improve on Tug test to <or=  25s to demonstrate improvement in lower extremity function, mobility and decreased fall risk. Baseline: 34s at eval; 27.53  Goal status: In progress - 03/23/23  6.  Pt will report fewer waking episodes nightly due to pain to demonstrate improved management of chronic pain Baseline: >5x nightly; 2-3x Goal status: MET 03/23/23   LONG TERM GOALS: Target date: 04/21/23  Pt to meet stated Foto Goal of 37% to demonstrate improved functional status Baseline: 15% at eval;  42%  Goal status: MET - 03/23/23  2.  Pt will improve strength in all areas listed by at least 20lbs to demonstrate improved overall physical function Baseline: see chart Goal status: In process 04/04/23  3.  Pt will amb up to or > 250 ft in 3 minute walk test to demonstrate improved toleration to amb Baseline: pending completion Goal status: INITIAL  4.  Pt will report decrease in worst pain to </= 7/10 for improved toleration to activity/quality of life and to demonstrate improved management of pain.  Baseline:  Goal status: INITIAL  5.  Pt will improve in bilat knee ROM by at least 10d in both flex and extension Baseline:  Goal status: INITIAL     PLAN:  PT FREQUENCY: 1-2x/week  PT DURATION: 10 weeks 15 visits per [facility]  PLANNED INTERVENTIONS: Therapeutic exercises, Therapeutic activity, Neuromuscular re-education, Balance training, Gait  training, Patient/Family education, Self Care, Joint mobilization, Stair training, Orthotic/Fit training, DME instructions, Aquatic Therapy, Dry Needling, Electrical stimulation, Cryotherapy, Moist heat, Taping, Ionotophoresis 4mg /ml Dexamethasone, Manual therapy, and Re-evaluation  PLAN FOR NEXT SESSION: Aquatic: strength, gait, stair climbing, STS, balance retraining, toleration to activity  William Savage) William Savage MPT 04/04/23 2:08 PM [facility] [address] [place], [place], [address] Phone: [phone]   Fax:  [phone]

## 2023-04-06 ENCOUNTER — Ambulatory Visit (HOSPITAL_BASED_OUTPATIENT_CLINIC_OR_DEPARTMENT_OTHER): Payer: No Typology Code available for payment source | Admitting: Physical Therapy

## 2023-04-10 DIAGNOSIS — Z23 Encounter for immunization: Secondary | ICD-10-CM | POA: Diagnosis not present

## 2023-04-10 DIAGNOSIS — E119 Type 2 diabetes mellitus without complications: Secondary | ICD-10-CM | POA: Diagnosis not present

## 2023-04-11 ENCOUNTER — Encounter (HOSPITAL_BASED_OUTPATIENT_CLINIC_OR_DEPARTMENT_OTHER): Payer: Self-pay | Admitting: Physical Therapy

## 2023-04-11 ENCOUNTER — Ambulatory Visit (HOSPITAL_BASED_OUTPATIENT_CLINIC_OR_DEPARTMENT_OTHER): Payer: No Typology Code available for payment source | Admitting: Physical Therapy

## 2023-04-11 DIAGNOSIS — G894 Chronic pain syndrome: Secondary | ICD-10-CM

## 2023-04-11 DIAGNOSIS — G8929 Other chronic pain: Secondary | ICD-10-CM

## 2023-04-11 DIAGNOSIS — M5459 Other low back pain: Secondary | ICD-10-CM

## 2023-04-11 DIAGNOSIS — M25561 Pain in right knee: Secondary | ICD-10-CM | POA: Diagnosis not present

## 2023-04-11 DIAGNOSIS — M6281 Muscle weakness (generalized): Secondary | ICD-10-CM

## 2023-04-11 NOTE — Therapy (Signed)
OUTPATIENT PHYSICAL THERAPY LOWER EXTREMITY TREATMENT    Patient Name: William Savage MRN: [id number] DOB:March 10, 1961, 62 y.o., male Today's Date: 04/11/2023  END OF SESSION:  PT End of Session - 04/11/23 1029     Visit Number 11    Number of Visits 15    Date for PT Re-Evaluation 04/21/23    Authorization Type Department of [facility]  15 visits approved from   12/24/22-04/23/23  [id number]  [facility]: [phone] [name]    PT Start Time 1030    PT Stop Time 1115    PT Time Calculation (min) 45 min    Activity Tolerance Patient tolerated treatment well    Behavior During Therapy [facility] for tasks assessed/performed               Past Medical History:  Diagnosis Date   Hyperlipidemia    Hypertension    Migraines    Nonobstructive atherosclerosis of coronary artery    Radiculopathy    Sleep apnea    TIA (transient ischemic attack)    Past Surgical History:  Procedure Laterality Date   BUNIONECTOMY Right    COLONOSCOPY  2021   Patient Active Problem List   Diagnosis Date Noted   Effusion, left knee 03/27/2023   Idiopathic gout of multiple sites 03/27/2023   Radiculitis 03/27/2023   Chronic low back pain 03/27/2023   Osteoarthritis of left knee 03/27/2023   Bilateral plantar fasciitis 03/27/2023    PCP: none  REFERRING PROVIDER: Vivi Barrack, DPM / Darlyn Chamber MD  REFERRING DIAG: - Chronic pain of both knees ; Chronic pain syndrome   THERAPY DIAG:  Chronic pain of both knees  Other low back pain  Muscle weakness-general  Chronic pain syndrome  Rationale for Evaluation and Treatment: Rehabilitation  ONSET DATE: >10 yrs  SUBJECTIVE:   SUBJECTIVE STATEMENT: "I went over the the [facility] and I think it is going to work out."    PERTINENT HISTORY: 01/16/23 referral from HQ:[email or web address] pain of both knees  Department of [facility] 15 visits approved from  12/24/22-04/23/23 [id number] [facility]: [phone] [name]  Referring  Provider: Darlyn Chamber   01/03/23 referral from KV:[id number] pain syndrome   11/15/22:ankle instability, plantar fasciitis, chronic foot pain  PAIN:  Are you having pain? Yes: NPRS scale: current 7/10 Lt knee;  6/10 Lt shoulder to 4th -5th finger; back 7//10 Pain location: see above  Pain description: radiating, achy, dull, stiffness Aggravating factors: movement, sitting x 15 mins, standing/walking 10 mins; driving Relieving factors: sleep, medication, massage(monthly)    PRECAUTIONS: Fall  RED FLAGS: None   WEIGHT BEARING RESTRICTIONS: No  FALLS:  Has patient fallen in last 6 months? Yes. Number of falls 1 left ankle gave out  LIVING ENVIRONMENT: Lives with: lives with their spouse Lives in: House/apartment Stairs: Yes: Internal: 16 steps; on right going up Has following equipment at home: Single point cane, Walker - 4 wheeled, and shower chair  OCCUPATION: disabled  PLOF: Requires assistive device for independence  PATIENT GOALS: less pain, get stronger, walk with AD  NEXT MD VISIT: as needed  OBJECTIVE:   DIAGNOSTIC FINDINGS:  01/25/23: AP lateral merchant radiographs left knee reviewed.  Medial compartment  arthritis which is severe is present.  Bone-on-bone changes noted with  some spurring on the medial side.  Lateral patellofemoral compartments  have less significant degenerative changes present.  Alignment is slight  varus.   AP lateral merchant radiographs right knee reviewed.  Alignment intact.  Mild medial joint space narrowing is present.  No spurs in the medial  lateral or patellofemoral compartments.   AP lateral radiographs lumbar spine reviewed.  Grade 1 spondylolisthesis  at L4-5 is present with significant degenerative disc disease at L5-S1.   Facet arthritis also pronounced at these levels.  No compression fracture.   X-ray cervical spine: neg  PATIENT SURVEYS:  FOTO primary score 15% with goal of 37%  03/23/23:42%  COGNITION: Overall  cognitive status: Within functional limits for tasks assessed     SENSATION: N&T 4th and 5th fingers R/L    MUSCLE LENGTH: Hamstrings: limited bilaterally   POSTURE: decreased lumbar lordosis  PALPATION: TTP throughout upper and lower back, glutes and with patellar mobs.  LOWER EXTREMITY ROM:   All movements limited by pain Active ROM Right eval Left eval Right / Left 04/04/23  Hip flexion     Hip extension     Hip abduction     Hip adduction     Hip internal rotation     Hip external rotation     Knee flexion 86 90 100 / 102  Knee extension -30 active -35 active -20 / -40 active -5 /-8 AA   Ankle dorsiflexion 0 0   Ankle plantarflexion     Ankle inversion     Ankle eversion      (Blank rows = not tested)  LOWER EXTREMITY MMT:   Pt limited by pain with all movements MMT Right eval Left eval R / L 04/04/23  Hip flexion 17.7 16.2 27.0 / 24.9  Hip extension     Hip abduction 14.0 22.7 19.0 / 14.5  Hip adduction     Hip internal rotation     Hip external rotation     Knee flexion     Knee extension 15.4 10.5 17.1 / 12.1  Ankle dorsiflexion     Ankle plantarflexion     Ankle inversion     Ankle eversion      (Blank rows = not tested)  LOWER EXTREMITY SPECIAL TESTS:  Unable to toelrate  FUNCTIONAL TESTS:  5 times sit to stand: not tolerated Timed up and go (TUG): 34.33 3 minute walk test: ran out of time.  Staged balance:0/4  03/23/23: TUG: 27.53s with cane  SLS at 19ft 6": LLE 3.74s; RLE 5s  GAIT: Distance walked: 562ft Assistive device utilized: Environmental consultant - 4 wheeled Level of assistance: Complete Independence Comments: rollator, decreased hip/knee flex, just clearing feet from floor, minimal heel strike and toe off, Posture guarded/antalgic    TODAY'S TREATMENT:                                                                                                                              Self care: review quad sets; instruction on indep completion  of HEP in aquatics, different programs at [facility] and here at [facility] seen for aquatic therapy today.  Treatment took place in water 3.5-4.75  ft in depth at the [facility] pool. Temp of water was 91.  Pt entered/exited the pool via stairs and step to pattern with bilateral hand rail.  *UE unsupported forward and backward multiple laps in 4.8 ft with reciprocal arm swing; side stepping R/L  *side stepping ue add/abd *Solid noodle stomp R/L hip in neutral then external rotation * straddling noodle and cycling (additional noodle under arms)  *Standing UE support yellow noodle: squats, hip add/abd; hip flex/ext; high knee marching. Cuing for SLS control and balance, abdominal bracing x12-15 ea    Pt requires the buoyancy and hydrostatic pressure of water for support, and to offload joints by unweighting joint load by at least 50 % in navel deep water and by at least 75-80% in chest to neck deep water.  Viscosity of the water is needed for resistance of strengthening. Water current perturbations provides challenge to standing balance requiring increased core activation.   PATIENT EDUCATION:  Education details: aquatic program progression Person educated: Patient Education method: Explanation Education comprehension: verbalized understanding  HOME EXERCISE PROGRAM: Access Code: [email or web address] URL: [email or web address] Date: 04/04/2023 Prepared by: Geni Bers   Added aquatic This aquatic home exercise program from [employer] utilizes pictures from land based exercises, but has been adapted prior to lamination and issuance.   Access Code: [email or web address] URL: [email or web address] Date: 04/11/2023 Prepared by: Geni Bers  Exercises - Supine Quad Set  - 2 x daily - 4-5 x weekly - 1 sets - 10 reps - 5 sec hold - Seated Quad Set  - 2 x daily - 4-5 x weekly - 1 sets - 10 reps - 5 sec hold - Hand Buoy Carry  - Side Stepping with Hand Floats  - Forward  Backward Tandem Walking  - Single Leg Stance  - 1 x daily - 1-3 x weekly - 1-3 sets - 3 reps - 10-20s hold - Noodle Stomp  - 1 x daily - 1-3 x weekly - 1-3 sets - 10 reps - Noodle press  - 1 x daily - 1-3 x weekly - 1-3 sets - 10 reps - Standing March with Leg Kick  - 1 x daily - 1-3 x weekly - 1-3 sets - 10 reps - Seated Straddle on Flotation Forward Breast Stroke Arms and Bicycle Legs   ASSESSMENT:  CLINICAL IMPRESSION: Pt has decided to go ahead with the DC at end of session rather than get extended through [place] completing aquatic HEP indep.  In process of gaining [facility] pool [facility] or [facility].  He has good tolerance to progressive LE strengthening and balance challenges.  Began creating final aquatic HEP. Plan to have program ready to issue in next 1-2 visits, have him complete indep at home pool then return for any clarification or questions.    PN:Pt demonstrates a general increase in le strength and ROM as indicated in above charts.  He has had a slight decrease in active left knee extension which I attribute to his reports of sleeping with a pillow up under his knee nightly. He is cautioned against to avoid contracture. Pt instructed on quad sets to complete in sitting or supine.  Copy given. Will begin land based intervention to progress/compliment program.  Will plan on re-cert in 2 weeks.  Pt also encouraged to reach out to [place] to have certification extended. (Ends 11/3)  Left foot pain has subsided.  He has improved toleration to SLS and is able to complete 10 consecutive STS meeting STG#4.  He will continue  to benefit from skilled PT intervention both aquatic and land based to progress toward maximum function, ease with ADL's and pain management.     OBJECTIVE IMPAIRMENTS: Abnormal gait, decreased activity tolerance, decreased balance, decreased endurance, decreased mobility, difficulty walking, decreased ROM, decreased strength, impaired flexibility, impaired sensation,  improper body mechanics, and pain.   ACTIVITY LIMITATIONS: carrying, lifting, bending, sitting, standing, squatting, stairs, transfers, bed mobility, continence, bathing, toileting, and locomotion level  PARTICIPATION LIMITATIONS: meal prep, cleaning, laundry, driving, shopping, community activity, occupation, and yard work  PERSONAL FACTORS: Age, Fitness, Profession, Time since onset of injury/illness/exacerbation, and multiple orthopedic/LB dysfunctions  are also affecting patient's functional outcome.   REHAB POTENTIAL: Good  CLINICAL DECISION MAKING: Unstable/unpredictable  EVALUATION COMPLEXITY: High   GOALS: Goals reviewed with patient? Yes  SHORT TERM GOALS: Target date: 04/14/23 Pt will tolerate full aquatic sessions consistently without increase in pain and with improving function to demonstrate good toleration and effectiveness of intervention.  Baseline: able to tolerate full session, but occasion increase in pain Goal status: Partially met -  03/23/23  2.  Pt will tolerate walking to or from setting along with tolerating full aquatic session without significant increase in pain or fatigue Baseline:  Goal status: MET -03/23/23  3.  Pt will complete SLS in 3.6 ft holding R/L x 20s to demonstrate improving balance Baseline: unable to complete Land based at eval;  see above  Goal status: In Process 04/04/23  4.  Pt will complete STS from bench onto pool floor x 10 continuously with ue support on foam and gaining immediate standing balance indep. Baseline:  Goal status: Met 04/04/23  5.  Pt will improve on Tug test to <or=  25s to demonstrate improvement in lower extremity function, mobility and decreased fall risk. Baseline: 34s at eval; 27.53  Goal status: In progress - 03/23/23  6.  Pt will report fewer waking episodes nightly due to pain to demonstrate improved management of chronic pain Baseline: >5x nightly; 2-3x Goal status: MET 03/23/23   LONG TERM GOALS: Target  date: 04/21/23  Pt to meet stated Foto Goal of 37% to demonstrate improved functional status Baseline: 15% at eval;  42%  Goal status: MET - 03/23/23  2.  Pt will improve strength in all areas listed by at least 20lbs to demonstrate improved overall physical function Baseline: see chart Goal status: In process 04/04/23  3.  Pt will amb up to or > 250 ft in 3 minute walk test to demonstrate improved toleration to amb Baseline: pending completion Goal status: INITIAL  4.  Pt will report decrease in worst pain to </= 7/10 for improved toleration to activity/quality of life and to demonstrate improved management of pain.  Baseline:  Goal status: INITIAL  5.  Pt will improve in bilat knee ROM by at least 10d in both flex and extension Baseline:  Goal status: In Process see chart 04/04/23     PLAN:  PT FREQUENCY: 1-2x/week  PT DURATION: 10 weeks 15 visits per [facility]  PLANNED INTERVENTIONS: Therapeutic exercises, Therapeutic activity, Neuromuscular re-education, Balance training, Gait training, Patient/Family education, Self Care, Joint mobilization, Stair training, Orthotic/Fit training, DME instructions, Aquatic Therapy, Dry Needling, Electrical stimulation, Cryotherapy, Moist heat, Taping, Ionotophoresis 4mg /ml Dexamethasone, Manual therapy, and Re-evaluation  PLAN FOR NEXT SESSION: Aquatic: strength, gait, stair climbing, STS, balance retraining, toleration to activity  Corrie Dandy Tomma Lightning) Shakenna Herrero MPT 04/11/23 1:13 PM [facility] [address] [place], [place], [address] Phone: [phone]  Fax:  [phone]

## 2023-04-13 ENCOUNTER — Encounter (HOSPITAL_BASED_OUTPATIENT_CLINIC_OR_DEPARTMENT_OTHER): Payer: Self-pay | Admitting: Physical Therapy

## 2023-04-13 ENCOUNTER — Ambulatory Visit (HOSPITAL_BASED_OUTPATIENT_CLINIC_OR_DEPARTMENT_OTHER): Payer: No Typology Code available for payment source | Admitting: Physical Therapy

## 2023-04-13 DIAGNOSIS — M25561 Pain in right knee: Secondary | ICD-10-CM | POA: Diagnosis not present

## 2023-04-13 DIAGNOSIS — M6281 Muscle weakness (generalized): Secondary | ICD-10-CM

## 2023-04-13 DIAGNOSIS — M5459 Other low back pain: Secondary | ICD-10-CM

## 2023-04-13 DIAGNOSIS — G894 Chronic pain syndrome: Secondary | ICD-10-CM

## 2023-04-13 DIAGNOSIS — G8929 Other chronic pain: Secondary | ICD-10-CM

## 2023-04-13 NOTE — Therapy (Signed)
OUTPATIENT PHYSICAL THERAPY LOWER EXTREMITY TREATMENT    Patient Name: William Savage MRN: [id number] DOB:1961/03/15, 62 y.o., male Today's Date: 04/13/2023  END OF SESSION:  PT End of Session - 04/13/23 1044     Visit Number 12    Number of Visits 15    Date for PT Re-Evaluation 04/21/23    Authorization Type Department of [facility]  15 visits approved from   12/24/22-04/23/23  [id number]  [facility]: [phone] William Savage    PT Start Time 1035    PT Stop Time 1115    PT Time Calculation (min) 40 min    Activity Tolerance Patient tolerated treatment well    Behavior During Therapy [facility] for tasks assessed/performed               Past Medical History:  Diagnosis Date   Hyperlipidemia    Hypertension    Migraines    Nonobstructive atherosclerosis of coronary artery    Radiculopathy    Sleep apnea    TIA (transient ischemic attack)    Past Surgical History:  Procedure Laterality Date   BUNIONECTOMY Right    COLONOSCOPY  2021   Patient Active Problem List   Diagnosis Date Noted   Effusion, left knee 03/27/2023   Idiopathic gout of multiple sites 03/27/2023   Radiculitis 03/27/2023   Chronic low back pain 03/27/2023   Osteoarthritis of left knee 03/27/2023   Bilateral plantar fasciitis 03/27/2023    PCP: none  REFERRING PROVIDER: Vivi Savage, DPM / William Chamber MD  REFERRING DIAG: - Chronic pain of both knees ; Chronic pain syndrome   THERAPY DIAG:  Chronic pain of both knees  Other low back pain  Muscle weakness-general  Chronic pain syndrome  Rationale for Evaluation and Treatment: Rehabilitation  ONSET DATE: >10 yrs  SUBJECTIVE:   SUBJECTIVE STATEMENT: "Pain is about the same.  Had a tour of [facility]."    PERTINENT HISTORY: 01/16/23 referral from HY:[id number] pain of both knees  Department of [facility] 15 visits approved from  12/24/22-04/23/23 [id number] [facility]: [phone] William Savage  Referring Provider: Darlyn Savage   01/03/23 referral from HB:ZJIRCVE pain syndrome   11/15/22:ankle instability, plantar fasciitis, chronic foot pain  PAIN:  Are you having pain? Yes: NPRS scale: current 7/10 Lt knee;  6/10 Lt shoulder to 4th -5th finger; back 7//10 Pain location: see above  Pain description: radiating, achy, dull, stiffness Aggravating factors: movement, sitting x 15 mins, standing/walking 10 mins; driving Relieving factors: sleep, medication, massage(monthly)    PRECAUTIONS: Fall  RED FLAGS: None   WEIGHT BEARING RESTRICTIONS: No  FALLS:  Has patient fallen in last 6 months? Yes. Number of falls 1 left ankle gave out  LIVING ENVIRONMENT: Lives with: lives with their spouse Lives in: House/apartment Stairs: Yes: Internal: 16 steps; on right going up Has following equipment at home: Single point cane, Walker - 4 wheeled, and shower chair  OCCUPATION: disabled  PLOF: Requires assistive device for independence  PATIENT GOALS: less pain, get stronger, walk with AD  NEXT MD VISIT: as needed  OBJECTIVE:   DIAGNOSTIC FINDINGS:  01/25/23: AP lateral merchant radiographs left knee reviewed.  Medial compartment  arthritis which is severe is present.  Bone-on-bone changes noted with  some spurring on the medial side.  Lateral patellofemoral compartments  have less significant degenerative changes present.  Alignment is slight  varus.   AP lateral merchant radiographs right knee reviewed.  Alignment intact.   Mild medial joint space  narrowing is present.  No spurs in the medial  lateral or patellofemoral compartments.   AP lateral radiographs lumbar spine reviewed.  Grade 1 spondylolisthesis  at L4-5 is present with significant degenerative disc disease at L5-S1.   Facet arthritis also pronounced at these levels.  No compression fracture.   X-ray cervical spine: neg  PATIENT SURVEYS:  FOTO primary score 15% with goal of 37%  03/23/23:42%  COGNITION: Overall cognitive status:  Within functional limits for tasks assessed     SENSATION: N&T 4th and 5th fingers R/L    MUSCLE LENGTH: Hamstrings: limited bilaterally   POSTURE: decreased lumbar lordosis  PALPATION: TTP throughout upper and lower back, glutes and with patellar mobs.  LOWER EXTREMITY ROM:   All movements limited by pain Active ROM Right eval Left eval Right / Left 04/04/23  Hip flexion     Hip extension     Hip abduction     Hip adduction     Hip internal rotation     Hip external rotation     Knee flexion 86 90 100 / 102  Knee extension -30 active -35 active -20 / -40 active -5 /-8 AA   Ankle dorsiflexion 0 0   Ankle plantarflexion     Ankle inversion     Ankle eversion      (Blank rows = not tested)  LOWER EXTREMITY MMT:   Pt limited by pain with all movements MMT Right eval Left eval R / L 04/04/23  Hip flexion 17.7 16.2 27.0 / 24.9  Hip extension     Hip abduction 14.0 22.7 19.0 / 14.5  Hip adduction     Hip internal rotation     Hip external rotation     Knee flexion     Knee extension 15.4 10.5 17.1 / 12.1  Ankle dorsiflexion     Ankle plantarflexion     Ankle inversion     Ankle eversion      (Blank rows = not tested)  LOWER EXTREMITY SPECIAL TESTS:  Unable to toelrate  FUNCTIONAL TESTS:  5 times sit to stand: not tolerated Timed up and go (TUG): 34.33 3 minute walk test: ran out of time.  Staged balance:0/4  03/23/23: TUG: 27.53s with cane  SLS at 37ft 6": LLE 3.74s; RLE 5s  GAIT: Distance walked: 552ft Assistive device utilized: Environmental consultant - 4 wheeled Level of assistance: Complete Independence Comments: rollator, decreased hip/knee flex, just clearing feet from floor, minimal heel strike and toe off, Posture guarded/antalgic    TODAY'S TREATMENT:                                                                                                                              Self care: review quad sets; instruction on indep completion of HEP in  aquatics, different programs at [facility] and here at [facility] seen for aquatic therapy today.  Treatment took place in water 3.5-4.75 ft in depth at  the [facility] pool. Temp of water was 91.  Pt entered/exited the pool via stairs and step to pattern with bilateral hand rail.  *UE unsupported forward and backward multiple laps in 4.8 ft with reciprocal arm swing; side stepping R/L  *side stepping ue add/abd *bow & arrow x 10 *yellow hand buoys horizontal shoulder add/abd x10-12 *triceps press x 10 yellow HB * straddling noodle and cycling (additional noodle under arms)  *Standing UE support yellow noodle: squats, hip add/abd; hip flex/ext; high knee marching. *Noodle stomp     Pt requires the buoyancy and hydrostatic pressure of water for support, and to offload joints by unweighting joint load by at least 50 % in navel deep water and by at least 75-80% in chest to neck deep water.  Viscosity of the water is needed for resistance of strengthening. Water current perturbations provides challenge to standing balance requiring increased core activation.   PATIENT EDUCATION:  Education details: aquatic program progression Person educated: Patient Education method: Explanation Education comprehension: verbalized understanding  HOME EXERCISE PROGRAM: Access Code: [email or web address] URL: [email or web address] Date: 04/04/2023 Prepared by: Geni Bers   Added aquatic This aquatic home exercise program from Med[employer] utilizes pictures from land based exercises, but has been adapted prior to lamination and issuance.   Access Code: [email or web address] URL: [email or web address] Date: 04/11/2023 Prepared by: Geni Bers   Exercises - Supine Quad Set  - 2 x daily - 4-5 x weekly - 1 sets - 10 reps - 5 sec hold - Seated Quad Set  - 2 x daily - 4-5 x weekly - 1 sets - 10 reps - 5 sec hold - Hand Buoy Carry  - Side Stepping with Hand Floats  - Forward Backward  Tandem Walking  - Single Leg Stance  - 1 x daily - 1-3 x weekly - 1-3 sets - 3 reps - 10-20s hold - Noodle Stomp  - 1 x daily - 1-3 x weekly - 1-3 sets - 10 reps - Noodle press  - 1 x daily - 1-3 x weekly - 1-3 sets - 10 reps - Seated Straddle on Flotation Forward Breast Stroke Arms and Bicycle Legs  - Sit to Stand  - 1 x daily - 1-3 x weekly - 1-2 sets - 10 reps - Standing Shoulder Horizontal Abduction with Resistance  - 1 x daily - 1-3 x weekly - 1-2 sets - 10 reps - Drawing Bow  - 1 x daily - 1-3 x weekly - 1-2 sets - 10 reps - Standing Hip Abduction  - 1 x daily - 1-3 x weekly - 1-2 sets - 10 reps - Squat  - 1 x daily - 1-3 x weekly - 1-2 sets - 10 reps - Standing Hip Flexion Extension  - 1 x daily - 1-3 x weekly - 1-2 sets - 10 reps Not issued  ASSESSMENT:  CLINICAL IMPRESSION: Continued cues needed for core engagement throughout session.  Pt with reports of shoulder flare with N&T in bilat ulnar distribution of arm and hands.  Completed UE ROM/stretching and gentle strengthening to address with reduction of symptoms by end of session.  Continued to add to HEP.  Copy  made and will be ready for issuing next session.  Pt to decide on pool access in next few days and will be ready for indep completion of HEP.  He tolerates session well. Gives good effort and is learning how to manage chronic pain well.     PN:Pt demonstrates a general increase in le strength  and ROM as indicated in above charts.  He has had a slight decrease in active left knee extension which I attribute to his reports of sleeping with a pillow up under his knee nightly. He is cautioned against to avoid contracture. Pt instructed on quad sets to complete in sitting or supine.  Copy given. Will begin land based intervention to progress/compliment program.  Will plan on re-cert in 2 weeks.  Pt also encouraged to reach out to [place] to have certification extended. (Ends 11/3)  Left foot pain has subsided.  He has improved  toleration to SLS and is able to complete 10 consecutive STS meeting STG#4.  He will continue to benefit from skilled PT intervention both aquatic and land based to progress toward maximum function, ease with ADL's and pain management.     OBJECTIVE IMPAIRMENTS: Abnormal gait, decreased activity tolerance, decreased balance, decreased endurance, decreased mobility, difficulty walking, decreased ROM, decreased strength, impaired flexibility, impaired sensation, improper body mechanics, and pain.   ACTIVITY LIMITATIONS: carrying, lifting, bending, sitting, standing, squatting, stairs, transfers, bed mobility, continence, bathing, toileting, and locomotion level  PARTICIPATION LIMITATIONS: meal prep, cleaning, laundry, driving, shopping, community activity, occupation, and yard work  PERSONAL FACTORS: Age, Fitness, Profession, Time since onset of injury/illness/exacerbation, and multiple orthopedic/LB dysfunctions  are also affecting patient's functional outcome.   REHAB POTENTIAL: Good  CLINICAL DECISION MAKING: Unstable/unpredictable  EVALUATION COMPLEXITY: High   GOALS: Goals reviewed with patient? Yes  SHORT TERM GOALS: Target date: 04/14/23 Pt will tolerate full aquatic sessions consistently without increase in pain and with improving function to demonstrate good toleration and effectiveness of intervention.  Baseline: able to tolerate full session, but occasion increase in pain Goal status: Partially met -  03/23/23  2.  Pt will tolerate walking to or from setting along with tolerating full aquatic session without significant increase in pain or fatigue Baseline:  Goal status: MET -03/23/23  3.  Pt will complete SLS in 3.6 ft holding R/L x 20s to demonstrate improving balance Baseline: unable to complete Land based at eval;  see above  Goal status: In Process 04/04/23  4.  Pt will complete STS from bench onto pool floor x 10 continuously with ue support on foam and gaining  immediate standing balance indep. Baseline:  Goal status: Met 04/04/23  5.  Pt will improve on Tug test to <or=  25s to demonstrate improvement in lower extremity function, mobility and decreased fall risk. Baseline: 34s at eval; 27.53  Goal status: In progress - 03/23/23  6.  Pt will report fewer waking episodes nightly due to pain to demonstrate improved management of chronic pain Baseline: >5x nightly; 2-3x Goal status: MET 03/23/23   LONG TERM GOALS: Target date: 04/21/23  Pt to meet stated Foto Goal of 37% to demonstrate improved functional status Baseline: 15% at eval;  42%  Goal status: MET - 03/23/23  2.  Pt will improve strength in all areas listed by at least 20lbs to demonstrate improved overall physical function Baseline: see chart Goal status: In process 04/04/23  3.  Pt will amb up to or > 250 ft in 3 minute walk test to demonstrate improved toleration to amb Baseline: pending completion Goal status: INITIAL  4.  Pt will report decrease in worst pain to </= 7/10 for improved toleration to activity/quality of life and to demonstrate improved management of pain.  Baseline:  Goal status: INITIAL  5.  Pt will improve in bilat knee ROM by at least 10d in  both flex and extension Baseline:  Goal status: In Process see chart 04/04/23     PLAN:  PT FREQUENCY: 1-2x/week  PT DURATION: 10 weeks 15 visits per [facility]  PLANNED INTERVENTIONS: Therapeutic exercises, Therapeutic activity, Neuromuscular re-education, Balance training, Gait training, Patient/Family education, Self Care, Joint mobilization, Stair training, Orthotic/Fit training, DME instructions, Aquatic Therapy, Dry Needling, Electrical stimulation, Cryotherapy, Moist heat, Taping, Ionotophoresis 4mg /ml Dexamethasone, Manual therapy, and Re-evaluation  PLAN FOR NEXT SESSION: Aquatic: strength, gait, stair climbing, STS, balance retraining, toleration to activity  Corrie Dandy Tomma Lightning) Ondre Salvetti MPT 04/13/23 1:47 PM [facility]  [facility] [address] [place], [place], [address] Phone: [phone]   Fax:  [phone]

## 2023-04-18 ENCOUNTER — Ambulatory Visit (HOSPITAL_BASED_OUTPATIENT_CLINIC_OR_DEPARTMENT_OTHER): Payer: No Typology Code available for payment source | Admitting: Physical Therapy

## 2023-04-20 ENCOUNTER — Encounter (HOSPITAL_BASED_OUTPATIENT_CLINIC_OR_DEPARTMENT_OTHER): Payer: Self-pay | Admitting: Physical Therapy

## 2023-04-20 ENCOUNTER — Ambulatory Visit (HOSPITAL_BASED_OUTPATIENT_CLINIC_OR_DEPARTMENT_OTHER): Payer: No Typology Code available for payment source | Admitting: Physical Therapy

## 2023-04-20 DIAGNOSIS — M6281 Muscle weakness (generalized): Secondary | ICD-10-CM

## 2023-04-20 DIAGNOSIS — M25561 Pain in right knee: Secondary | ICD-10-CM | POA: Diagnosis not present

## 2023-04-20 DIAGNOSIS — G8929 Other chronic pain: Secondary | ICD-10-CM

## 2023-04-20 DIAGNOSIS — M5459 Other low back pain: Secondary | ICD-10-CM

## 2023-04-20 NOTE — Therapy (Signed)
OUTPATIENT PHYSICAL THERAPY LOWER EXTREMITY TREATMENT  PHYSICAL THERAPY DISCHARGE SUMMARY  Visits from Start of Care: 13  Current functional level related to goals / functional outcomes: See goals   Remaining deficits: Chronic pain, muscle weakness   Education / Equipment: Management of condition/HEP   Patient agrees to discharge. Patient goals were partially met. Patient is being discharged due to being pleased with the current functional level.   Patient Name: William Savage MRN: [id number] DOB:12/04/1960, 62 y.o., male Today's Date: 04/20/2023  END OF SESSION:  PT End of Session - 04/20/23 1032     Visit Number 13    Number of Visits 15    Date for PT Re-Evaluation 04/21/23    Authorization Type Department of [facility]  15 visits approved from   12/24/22-04/23/23  [id number]  [facility]: [phone] [name]    PT Start Time 1031    PT Stop Time 1115    PT Time Calculation (min) 44 min    Activity Tolerance Patient tolerated treatment well    Behavior During Therapy [facility] for tasks assessed/performed               Past Medical History:  Diagnosis Date   Hyperlipidemia    Hypertension    Migraines    Nonobstructive atherosclerosis of coronary artery    Radiculopathy    Sleep apnea    TIA (transient ischemic attack)    Past Surgical History:  Procedure Laterality Date   BUNIONECTOMY Right    COLONOSCOPY  2021   Patient Active Problem List   Diagnosis Date Noted   Effusion, left knee 03/27/2023   Idiopathic gout of multiple sites 03/27/2023   Radiculitis 03/27/2023   Chronic low back pain 03/27/2023   Osteoarthritis of left knee 03/27/2023   Bilateral plantar fasciitis 03/27/2023    PCP: none  REFERRING PROVIDER: Vivi Barrack, DPM / Darlyn Chamber MD  REFERRING DIAG: - Chronic pain of both knees ; Chronic pain syndrome   THERAPY DIAG:  Chronic pain of both knees  Other low back pain  Muscle weakness-general  Rationale for  Evaluation and Treatment: Rehabilitation  ONSET DATE: >10 yrs  SUBJECTIVE:   SUBJECTIVE STATEMENT: "Highest pain last week was an 8/10. Average pain 7/10.  After water sessions pt reports decreased pain sensitivity for about 1 week.  My goal is to come 2 x week to work in pool here at [employer] [employer].    PERTINENT HISTORY: 01/16/23 referral from HQ:IONGEXB pain of both knees  Department of [facility] 15 visits approved from  12/24/22-04/23/23 [id number] [facility]: [phone] [name]  Referring Provider: Darlyn Chamber   01/03/23 referral from KV:[id number] pain syndrome   11/15/22:ankle instability, plantar fasciitis, chronic foot pain  PAIN:  Are you having pain? Yes: NPRS scale: current 7/10 Lt knee;  6/10 Lt shoulder to 4th -5th finger; back 7//10 Pain location: see above  Pain description: radiating, achy, dull, stiffness Aggravating factors: movement, sitting x 15 mins, standing/walking 10 mins; driving Relieving factors: sleep, medication, massage(monthly)    PRECAUTIONS: Fall  RED FLAGS: None   WEIGHT BEARING RESTRICTIONS: No  FALLS:  Has patient fallen in last 6 months? Yes. Number of falls 1 left ankle gave out  LIVING ENVIRONMENT: Lives with: lives with their spouse Lives in: House/apartment Stairs: Yes: Internal: 16 steps; on right going up Has following equipment at home: Single point cane, Walker - 4 wheeled, and shower chair  OCCUPATION: disabled  PLOF: Requires assistive device for independence  PATIENT GOALS: less pain, get stronger, walk with AD  NEXT MD VISIT: as needed  OBJECTIVE:   DIAGNOSTIC FINDINGS:  01/25/23: AP lateral merchant radiographs left knee reviewed.  Medial compartment  arthritis which is severe is present.  Bone-on-bone changes noted with  some spurring on the medial side.  Lateral patellofemoral compartments  have less significant degenerative changes present.  Alignment is slight  varus.   AP lateral merchant  radiographs right knee reviewed.  Alignment intact.   Mild medial joint space narrowing is present.  No spurs in the medial  lateral or patellofemoral compartments.   AP lateral radiographs lumbar spine reviewed.  Grade 1 spondylolisthesis  at L4-5 is present with significant degenerative disc disease at L5-S1.   Facet arthritis also pronounced at these levels.  No compression fracture.   X-ray cervical spine: neg  PATIENT SURVEYS:  FOTO primary score 15% with goal of 37%  03/23/23:42%  COGNITION: Overall cognitive status: Within functional limits for tasks assessed     SENSATION: N&T 4th and 5th fingers R/L    MUSCLE LENGTH: Hamstrings: limited bilaterally   POSTURE: decreased lumbar lordosis  PALPATION: TTP throughout upper and lower back, glutes and with patellar mobs.  LOWER EXTREMITY ROM:   All movements limited by pain Active ROM Right eval Left eval Right / Left 04/04/23  Hip flexion     Hip extension     Hip abduction     Hip adduction     Hip internal rotation     Hip external rotation     Knee flexion 86 90 100 / 102  Knee extension -30 active -35 active -20 / -40 active -5 /-8 AA   Ankle dorsiflexion 0 0   Ankle plantarflexion     Ankle inversion     Ankle eversion      (Blank rows = not tested)  LOWER EXTREMITY MMT:   Pt limited by pain with all movements MMT Right eval Left eval R / L 04/04/23 R / L 04/20/23  Hip flexion 17.7 16.2 27.0 / 24.9  L-21.7  Hip extension      Hip abduction 14.0 22.7 19.0 / 14.5 16.4 / 15.4  Hip adduction      Hip internal rotation      Hip external rotation      Knee flexion      Knee extension 15.4 10.5 17.1 / 12.1 15.4 / 11.5  Ankle dorsiflexion      Ankle plantarflexion      Ankle inversion      Ankle eversion       (Blank rows = not tested)  LOWER EXTREMITY SPECIAL TESTS:  Unable to toelrate  FUNCTIONAL TESTS:  5 times sit to stand: not tolerated Timed up and go (TUG): 34.33 3 minute walk  test: ran out of time.  Staged balance:0/4  03/23/23: TUG: 27.53s with cane  SLS at 41ft 6": LLE 3.74s; RLE 5s   04/20/23: TUG: 21.46    3 minute walk test: using cane 302 ft  GAIT: Distance walked: 562ft Assistive device utilized: Environmental consultant - 4 wheeled Level of assistance: Complete Independence Comments: rollator, decreased hip/knee flex, just clearing feet from floor, minimal heel strike and toe off, Posture guarded/antalgic    TODAY'S TREATMENT:  Pt seen for aquatic therapy today.  Treatment took place in water 3.5-4.75 ft in depth at the [facility] pool. Temp of water was 91.  Pt entered/exited the pool via stairs and step to pattern with bilateral hand rail.   Exercises - Side Stepping with Hand Floats  - Forward Backward Tandem Walking  - Single Leg Stance   - Noodle Stomp   - Noodle press  - Seated Straddle on Flotation Forward Breast Stroke Arms and Bicycle Legs  - Sit to Stand   - Standing Shoulder Horizontal Abduction with Resistance   - Drawing Bow   - Standing Hip Abduction  - Squat   - Standing Hip Flexion Extension   Pt requires the buoyancy and hydrostatic pressure of water for support, and to offload joints by unweighting joint load by at least 50 % in navel deep water and by at least 75-80% in chest to neck deep water.  Viscosity of the water is needed for resistance of strengthening. Water current perturbations provides challenge to standing balance requiring increased core activation.   PATIENT EDUCATION:  Education details: aquatic program progression Person educated: Patient Education method: Explanation Education comprehension: verbalized understanding  HOME EXERCISE PROGRAM: Access Code: [email or web address] URL: [email or web address] Date: 04/04/2023 Prepared by: Geni Bers   Added aquatic This aquatic home  exercise program from Med[employer] utilizes pictures from land based exercises, but has been adapted prior to lamination and issuance.   Access Code: [email or web address] URL: [email or web address] Date: 04/11/2023 Prepared by: Geni Bers   Exercises - Supine Quad Set  - 2 x daily - 4-5 x weekly - 1 sets - 10 reps - 5 sec hold - Seated Quad Set  - 2 x daily - 4-5 x weekly - 1 sets - 10 reps - 5 sec hold - Hand Buoy Carry  - Side Stepping with Hand Floats  - Forward Backward Tandem Walking  - Single Leg Stance  - 1 x daily - 1-3 x weekly - 1-3 sets - 3 reps - 10-20s hold - Noodle Stomp  - 1 x daily - 1-3 x weekly - 1-3 sets - 10 reps - Noodle press  - 1 x daily - 1-3 x weekly - 1-3 sets - 10 reps - Seated Straddle on Flotation Forward Breast Stroke Arms and Bicycle Legs  - Sit to Stand  - 1 x daily - 1-3 x weekly - 1-2 sets - 10 reps - Standing Shoulder Horizontal Abduction with Resistance  - 1 x daily - 1-3 x weekly - 1-2 sets - 10 reps - Drawing Bow  - 1 x daily - 1-3 x weekly - 1-2 sets - 10 reps - Standing Hip Abduction  - 1 x daily - 1-3 x weekly - 1-2 sets - 10 reps - Squat  - 1 x daily - 1-3 x weekly - 1-2 sets - 10 reps - Standing Hip Flexion Extension  - 1 x daily - 1-3 x weekly - 1-2 sets - 10 reps Not issued  ASSESSMENT:  CLINICAL IMPRESSION: Pt arrives for last aquatic appt as he has opted against transitioning onto land based intervention.  He reports he will be becoming a member of [facility] and have pool access going forward. He is instructed on final aquatic HEP which is laminated and issued. He is given VC for some of the exercises and written clarifications added to program.  He is encouraged to reach out with any questions.  DC testing completed.  He did not meet his strength or  balance goal I do believe due to his pain sensitivity which is variable day to day as it is chronic in nature (pain goal not met) but better managed as per pt).  He did progress in both areas.   All other goals met.  He is ready and in agreement of DC     OBJECTIVE IMPAIRMENTS: Abnormal gait, decreased activity tolerance, decreased balance, decreased endurance, decreased mobility, difficulty walking, decreased ROM, decreased strength, impaired flexibility, impaired sensation, improper body mechanics, and pain.   ACTIVITY LIMITATIONS: carrying, lifting, bending, sitting, standing, squatting, stairs, transfers, bed mobility, continence, bathing, toileting, and locomotion level  PARTICIPATION LIMITATIONS: meal prep, cleaning, laundry, driving, shopping, community activity, occupation, and yard work  PERSONAL FACTORS: Age, Fitness, Profession, Time since onset of injury/illness/exacerbation, and multiple orthopedic/LB dysfunctions  are also affecting patient's functional outcome.   REHAB POTENTIAL: Good  CLINICAL DECISION MAKING: Unstable/unpredictable  EVALUATION COMPLEXITY: High   GOALS: Goals reviewed with patient? Yes  SHORT TERM GOALS: Target date: 04/14/23 Pt will tolerate full aquatic sessions consistently without increase in pain and with improving function to demonstrate good toleration and effectiveness of intervention.  Baseline: able to tolerate full session, but occasion increase in pain Goal status: Partially met -  03/23/23 Met 04/20/23  2.  Pt will tolerate walking to or from setting along with tolerating full aquatic session without significant increase in pain or fatigue Baseline:  Goal status: MET -03/23/23  3.  Pt will complete SLS in 3.6 ft holding R/L x 20s to demonstrate improving balance Baseline: unable to complete Land based at eval;  see above  Goal status: In Process 04/04/23; Not Met 04/20/23  4.  Pt will complete STS from bench onto pool floor x 10 continuously with ue support on foam and gaining immediate standing balance indep. Baseline:  Goal status: Met 04/04/23  5.  Pt will improve on Tug test to <or=  25s to demonstrate improvement in  lower extremity function, mobility and decreased fall risk. Baseline: 34s at eval; 27.53 ; 21.46 Goal status: In progress - 03/23/23 - Met 04/20/23  6.  Pt will report fewer waking episodes nightly due to pain to demonstrate improved management of chronic pain Baseline: >5x nightly; 2-3x Goal status: MET 03/23/23   LONG TERM GOALS: Target date: 04/21/23  Pt to meet stated Foto Goal of 37% to demonstrate improved functional status Baseline: 15% at eval;  42%  Goal status: MET - 03/23/23  2.  Pt will improve strength in all areas listed by at least 20lbs to demonstrate improved overall physical function Baseline: see chart Goal status: In process 04/04/23/ Not Met  3.  Pt will amb up to or > 250 ft in 3 minute walk test to demonstrate improved toleration to amb Baseline: pending completion Goal status: Met 302 ft 04/20/23  4.  Pt will report decrease in worst pain to </= 7/10 for improved toleration to activity/quality of life and to demonstrate improved management of pain.  Baseline:  Goal status:Not Met 04/20/23  5.  Pt will improve in bilat knee ROM by at least 10d in both flex and extension Baseline:  Goal status: In Process see chart 04/04/23; Not met 04/20/23     PLAN:  PT FREQUENCY: 1-2x/week  PT DURATION: 10 weeks 15 visits per [facility]  PLANNED INTERVENTIONS: Therapeutic exercises, Therapeutic activity, Neuromuscular re-education, Balance training, Gait training, Patient/Family education, Self Care, Joint mobilization, Stair training, Orthotic/Fit training, DME instructions, Aquatic Therapy, Dry Needling, Electrical stimulation, Cryotherapy, Moist heat,  Taping, Ionotophoresis 4mg /ml Dexamethasone, Manual therapy, and Re-evaluation  PLAN FOR NEXT SESSION: DC  Rushie Chestnut) Chen Saadeh MPT 04/20/23 11:32 AM [facility] [address] [place], [place], [address] Phone: [phone]   Fax:  [phone]

## 2023-05-03 ENCOUNTER — Ambulatory Visit: Payer: No Typology Code available for payment source | Attending: Endocrinology | Admitting: Internal Medicine

## 2023-05-03 ENCOUNTER — Encounter: Payer: Self-pay | Admitting: Internal Medicine

## 2023-05-03 VITALS — BP 146/82 | HR 66 | Resp 16 | Ht 72.0 in | Wt 248.0 lb

## 2023-05-03 DIAGNOSIS — M1A09X0 Idiopathic chronic gout, multiple sites, without tophus (tophi): Secondary | ICD-10-CM

## 2023-05-03 DIAGNOSIS — M175 Other unilateral secondary osteoarthritis of knee: Secondary | ICD-10-CM

## 2023-05-03 DIAGNOSIS — M1712 Unilateral primary osteoarthritis, left knee: Secondary | ICD-10-CM | POA: Diagnosis not present

## 2023-05-03 MED ORDER — ALLOPURINOL 100 MG PO TABS
100.0000 mg | ORAL_TABLET | Freq: Every day | ORAL | 2 refills | Status: DC
Start: 2023-05-03 — End: 2023-10-04

## 2023-05-03 NOTE — Patient Instructions (Signed)
Allopurinol Tablets What is this medication? ALLOPURINOL (al oh PURE i nole) treats gout or kidney stones. It may also be used to prevent high uric acid levels after chemotherapy. It works by decreasing uric acid levels in your body. This medicine may be used for other purposes; ask your health care provider or pharmacist if you have questions. COMMON BRAND NAME(S): Zyloprim What should I tell my care team before I take this medication? They need to know if you have any of these conditions: Kidney disease Liver disease An unusual or allergic reaction to allopurinol, other medications, foods, dyes, or preservatives Pregnant or trying to get pregnant Breast-feeding How should I use this medication? Take this medication by mouth with a full glass of water. Take it as directed on the prescription label at the same time every day. You can take it with or without food. If it upsets your stomach, take it with food. Keep taking it unless your care team tells you to stop. Talk to your care team about the use of this medication in children. While this medication may be prescribed for children for selected conditions, precautions do apply. Overdosage: If you think you have taken too much of this medicine contact a poison control center or emergency room at once. NOTE: This medicine is only for you. Do not share this medicine with others. What if I miss a dose? If you miss a dose, take it as soon as you can. If it is almost time for your next dose, take only that dose. Do not take double or extra doses. What may interact with this medication? Do not take this medication with the following: Didanosine, ddI This medication may also interact with the following: Certain antibiotics like amoxicillin, ampicillin Certain medications for cancer Certain medications for immunosuppression like azathioprine, cyclosporine, mercaptopurine Chlorpropamide Probenecid Sulfinpyrazone Thiazide diuretics, like  hydrochlorothiazide Warfarin This list may not describe all possible interactions. Give your health care provider a list of all the medicines, herbs, non-prescription drugs, or dietary supplements you use. Also tell them if you smoke, drink alcohol, or use illegal drugs. Some items may interact with your medicine. What should I watch for while using this medication? Visit your care team for regular checks on your progress. If you are taking this medication to treat gout, you may not have less frequent attacks at first. Keep taking your medication regularly and the attacks should get better within 2 to 6 weeks. Drink plenty of water (10 to 12 full glasses a day) while you are taking this medication. This will help to reduce stomach upset and reduce the risk of getting gout or kidney stones. This medication may cause serious skin reactions. They can happen weeks to months after starting the medication. Contact your care team right away if you notice fevers or flu-like symptoms with a rash. The rash may be red or purple and then turn into blisters or peeling of the skin. You may also notice a red rash with swelling of the face, lips, or lymph nodes in your neck or under your arms. Do not take vitamin C without asking your care team. Too much vitamin C can increase the chance of getting kidney stones. This medication may affect your coordination, reaction time, or judgment. Do not drive or operate machinery until you know how this medication affects you. Sit up or stand slowly to reduce the risk of dizzy or fainting spells. Drinking alcohol with this medication can increase the risk of these side effects. What  side effects may I notice from receiving this medication? Side effects that you should report to your care team as soon as possible: Allergic reactions--skin rash, itching, hives, swelling of the face, lips, tongue, or throat Kidney injury--decrease in the amount of urine, swelling of the ankles, hands,  or feet Liver injury--right upper belly pain, loss of appetite, nausea, light-colored stool, dark yellow or brown urine, yellowing skin or eyes, unusual weakness or fatigue Rash, fever, and swollen lymph nodes Redness, blistering, peeling, or loosening of the skin, including inside the mouth Side effects that usually do not require medical attention (report to your care team if they continue or are bothersome): Diarrhea Drowsiness Nausea Vomiting This list may not describe all possible side effects. Call your doctor for medical advice about side effects. You may report side effects to FDA at 1-800-FDA-1088. Where should I keep my medication? Keep out of the reach of children and pets. Store at room temperature between 15 and 25 degrees C (59 and 77 degrees F). Protect from light and moisture. Throw away any unused medication after the expiration date. NOTE: This sheet is a summary. It may not cover all possible information. If you have questions about this medicine, talk to your doctor, pharmacist, or health care provider.  2024 Elsevier/Gold Standard (2021-12-24 00:00:00)
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
## 2023-05-09 ENCOUNTER — Other Ambulatory Visit: Payer: Self-pay | Admitting: Internal Medicine

## 2023-05-09 DIAGNOSIS — M1A09X0 Idiopathic chronic gout, multiple sites, without tophus (tophi): Secondary | ICD-10-CM

## 2023-05-22 DIAGNOSIS — M109 Gout, unspecified: Secondary | ICD-10-CM | POA: Diagnosis not present

## 2023-05-22 DIAGNOSIS — R82998 Other abnormal findings in urine: Secondary | ICD-10-CM | POA: Diagnosis not present

## 2023-05-22 DIAGNOSIS — E782 Mixed hyperlipidemia: Secondary | ICD-10-CM | POA: Diagnosis not present

## 2023-05-22 DIAGNOSIS — E119 Type 2 diabetes mellitus without complications: Secondary | ICD-10-CM | POA: Diagnosis not present

## 2023-05-22 DIAGNOSIS — N401 Enlarged prostate with lower urinary tract symptoms: Secondary | ICD-10-CM | POA: Diagnosis not present

## 2023-05-24 DIAGNOSIS — F419 Anxiety disorder, unspecified: Secondary | ICD-10-CM | POA: Diagnosis not present

## 2023-05-24 DIAGNOSIS — F4312 Post-traumatic stress disorder, chronic: Secondary | ICD-10-CM | POA: Diagnosis not present

## 2023-05-24 DIAGNOSIS — G4700 Insomnia, unspecified: Secondary | ICD-10-CM | POA: Diagnosis not present

## 2023-05-24 DIAGNOSIS — F332 Major depressive disorder, recurrent severe without psychotic features: Secondary | ICD-10-CM | POA: Diagnosis not present

## 2023-05-29 DIAGNOSIS — E119 Type 2 diabetes mellitus without complications: Secondary | ICD-10-CM | POA: Diagnosis not present

## 2023-05-29 DIAGNOSIS — Z0000 Encounter for general adult medical examination without abnormal findings: Secondary | ICD-10-CM | POA: Diagnosis not present

## 2023-05-29 DIAGNOSIS — Z1339 Encounter for screening examination for other mental health and behavioral disorders: Secondary | ICD-10-CM | POA: Diagnosis not present

## 2023-05-29 DIAGNOSIS — Z1331 Encounter for screening for depression: Secondary | ICD-10-CM | POA: Diagnosis not present

## 2023-06-04 ENCOUNTER — Other Ambulatory Visit: Payer: Self-pay | Admitting: Podiatry

## 2023-06-15 DIAGNOSIS — M25562 Pain in left knee: Secondary | ICD-10-CM | POA: Diagnosis not present

## 2023-06-15 DIAGNOSIS — M1712 Unilateral primary osteoarthritis, left knee: Secondary | ICD-10-CM | POA: Diagnosis not present

## 2023-06-15 DIAGNOSIS — M25462 Effusion, left knee: Secondary | ICD-10-CM | POA: Diagnosis not present

## 2023-07-13 DIAGNOSIS — M25561 Pain in right knee: Secondary | ICD-10-CM | POA: Diagnosis not present

## 2023-07-13 DIAGNOSIS — M1712 Unilateral primary osteoarthritis, left knee: Secondary | ICD-10-CM | POA: Diagnosis not present
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
## 2023-07-25 DIAGNOSIS — H04123 Dry eye syndrome of bilateral lacrimal glands: Secondary | ICD-10-CM | POA: Diagnosis not present

## 2023-07-25 DIAGNOSIS — H524 Presbyopia: Secondary | ICD-10-CM | POA: Diagnosis not present

## 2023-07-25 DIAGNOSIS — H47093 Other disorders of optic nerve, not elsewhere classified, bilateral: Secondary | ICD-10-CM | POA: Diagnosis not present

## 2023-07-25 DIAGNOSIS — H2513 Age-related nuclear cataract, bilateral: Secondary | ICD-10-CM | POA: Diagnosis not present

## 2023-07-28 DIAGNOSIS — K0889 Other specified disorders of teeth and supporting structures: Secondary | ICD-10-CM | POA: Diagnosis not present

## 2023-07-28 DIAGNOSIS — R519 Headache, unspecified: Secondary | ICD-10-CM | POA: Diagnosis not present

## 2023-07-28 DIAGNOSIS — M542 Cervicalgia: Secondary | ICD-10-CM | POA: Diagnosis not present

## 2023-08-03 DIAGNOSIS — K219 Gastro-esophageal reflux disease without esophagitis: Secondary | ICD-10-CM | POA: Diagnosis not present

## 2023-08-03 DIAGNOSIS — K582 Mixed irritable bowel syndrome: Secondary | ICD-10-CM | POA: Diagnosis not present

## 2023-08-07 ENCOUNTER — Ambulatory Visit: Payer: No Typology Code available for payment source | Admitting: Internal Medicine

## 2023-08-07 DIAGNOSIS — M1A09X0 Idiopathic chronic gout, multiple sites, without tophus (tophi): Secondary | ICD-10-CM

## 2023-08-07 DIAGNOSIS — Z5181 Encounter for therapeutic drug level monitoring: Secondary | ICD-10-CM

## 2023-08-07 DIAGNOSIS — M175 Other unilateral secondary osteoarthritis of knee: Secondary | ICD-10-CM
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
## 2023-09-20 ENCOUNTER — Ambulatory Visit: Payer: No Typology Code available for payment source | Admitting: Internal Medicine

## 2023-09-20 DIAGNOSIS — M175 Other unilateral secondary osteoarthritis of knee: Secondary | ICD-10-CM

## 2023-09-20 DIAGNOSIS — M1A09X0 Idiopathic chronic gout, multiple sites, without tophus (tophi): Secondary | ICD-10-CM

## 2023-09-20 DIAGNOSIS — Z5181 Encounter for therapeutic drug level monitoring: Secondary | ICD-10-CM
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
## 2023-09-26 ENCOUNTER — Encounter: Payer: Self-pay | Admitting: Internal Medicine

## 2023-09-26 ENCOUNTER — Ambulatory Visit: Attending: Internal Medicine | Admitting: Internal Medicine

## 2023-09-26 VITALS — BP 127/82 | HR 57 | Resp 14 | Ht 72.0 in | Wt 232.0 lb

## 2023-09-26 DIAGNOSIS — M175 Other unilateral secondary osteoarthritis of knee: Secondary | ICD-10-CM

## 2023-09-26 DIAGNOSIS — Z5181 Encounter for therapeutic drug level monitoring: Secondary | ICD-10-CM | POA: Diagnosis not present

## 2023-09-26 DIAGNOSIS — M25462 Effusion, left knee: Secondary | ICD-10-CM

## 2023-09-26 DIAGNOSIS — M1A09X0 Idiopathic chronic gout, multiple sites, without tophus (tophi): Secondary | ICD-10-CM | POA: Diagnosis not present

## 2023-09-26 NOTE — Patient Instructions (Signed)
 Your knee shows continued evidence of joint swelling, thickening of the joint lining, and infrapatellar bursitis in the front of the knee. These could be related to chronic damage but also may indicate ongoing joint inflammation.  I am checking the sedimentation rate and uric acid level to assess response to the allopurinol.  I will recheck your metabolic panel test as well to look at kidney function.   Stretching and range-of-motion exercise This exercise warms up your muscles and joints. It also improves the movement and flexibility of your neck and shoulder. This exercise also helps to relieve pain and stiffness. Cervical side bend  Using good posture, sit on a stable chair or stand up. Without moving your shoulders, slowly tilt your left / right ear toward your left / right shoulder until you feel a stretch in your neck (cervical) muscles on the other side. You should be looking straight ahead. Hold for __________ seconds. Slowly return to the starting position. Repeat the stretch on your left / right side. Repeat __________ times. Complete this exercise __________ times a day. Strengthening exercises These exercises build strength and endurance in your shoulder. Endurance is the ability to use your muscles for a long time, even after they get tired. Scapular retraction, isotonic  Sit in a stable chair without armrests or stand up. Secure an exercise band to a stable object in front of you so the band is at shoulder height. Hold one end of the exercise band in each hand. Squeeze your shoulder blades (scapulae) together and move your elbows slightly behind you (retraction). Do not shrug your shoulders upward while you do this. Hold for __________ seconds. Slowly return to the starting position. Repeat __________ times. Complete this exercise __________ times a day. Shoulder extension with scapular retraction, isotonic  Sit in a stable chair without armrests or stand up. Secure an  exercise band to a stable object in front of you so the band is above shoulder height. Hold one end of the exercise band in each hand. Straighten your elbows and lift your hands up to shoulder height. Squeeze your shoulder blades together (scapular retraction) and pull your hands down to the sides of your thighs (shoulder extension). Stop when your hands are straight down by your sides. Do not let your hands go behind your body. Hold for __________ seconds. Slowly return to the starting position. Repeat __________ times. Complete this exercise __________ times a day. Shoulder abduction, isotonic  Sit in a stable chair without armrests or stand up. If told, hold a __________ weight in your left / right hand. Start with your arms straight down. Turn your left / right hand so your palm faces in, toward your body. Slowly lift your left / right hand out to your side (abduction). Do not lift your hand above shoulder height. Keep your arms straight. Avoid shrugging your shoulder while you do this movement. Keep your shoulder blade tucked down toward the middle of your back. Hold for __________ seconds. Slowly lower your arm, and return to the starting position. Repeat __________ times. Complete this exercise __________ times a day.

## 2023-09-28 LAB — BASIC METABOLIC PANEL WITH GFR
BUN: 14 mg/dL (ref 7–25)
CO2: 29 mmol/L (ref 20–32)
Calcium: 9.7 mg/dL (ref 8.6–10.3)
Chloride: 105 mmol/L (ref 98–110)
Creat: 1.27 mg/dL (ref 0.70–1.35)
Glucose, Bld: 91 mg/dL (ref 65–99)
Potassium: 4.2 mmol/L (ref 3.5–5.3)
Sodium: 139 mmol/L (ref 135–146)
eGFR: 65 mL/min/{1.73_m2} (ref >=60)

## 2023-09-28 LAB — SEDIMENTATION RATE: Sed Rate: 17 mm/h (ref 0–20)

## 2023-09-28 LAB — URIC ACID: Uric Acid, Serum: 7 mg/dL (ref 4.0–8.0)

## 2023-09-28 LAB — CYCLIC CITRUL PEPTIDE ANTIBODY, IGG: Cyclic Citrullin Peptide Ab: <16 U

## 2023-10-04 ENCOUNTER — Encounter: Payer: Self-pay | Admitting: Internal Medicine

## 2023-10-04 MED ORDER — ALLOPURINOL 300 MG PO TABS: 300.0000 mg | ORAL_TABLET | Freq: Every day | ORAL | 1 refills | Status: DC

## 2023-10-11 DIAGNOSIS — E119 Type 2 diabetes mellitus without complications: Secondary | ICD-10-CM | POA: Diagnosis not present

## 2023-10-11 DIAGNOSIS — I119 Hypertensive heart disease without heart failure: Secondary | ICD-10-CM | POA: Diagnosis not present

## 2023-10-19 ENCOUNTER — Telehealth: Payer: Self-pay

## 2023-10-19 DIAGNOSIS — M170 Bilateral primary osteoarthritis of knee: Secondary | ICD-10-CM | POA: Diagnosis not present

## 2023-10-19 NOTE — Telephone Encounter (Signed)
   Pre-operative Risk Assessment    Patient Name: William Savage  DOB: [birth year] MRN: [id number]   Date of last office visit: 02/06/23  Date of next office visit: N/A   Request for Surgical Clearance    Procedure:   Left total knee arthroplasty  Date of Surgery:  Clearance 01/02/24                                 Surgeon:  Dr. Claiborne Crew Surgeon's Group or Practice Name:  [facility]  Phone number:  [phone] Fax number:  [phone]   Type of Clearance Requested:   - Medical  - Pharmacy:  Hold Aspirin Not indicated    Type of Anesthesia:  Spinal   Additional requests/questions:    SignedZulema Hitchcock   10/19/2023, 4:14 PM

## 2023-10-20 ENCOUNTER — Telehealth: Payer: Self-pay | Admitting: *Deleted

## 2023-10-20 NOTE — Telephone Encounter (Signed)
   Name: William Savage  DOB: 12/07/1960  MRN: [id number]  Primary Cardiologist: Olinda Bertrand, DO   Preoperative team, please contact this patient and set up a phone call appointment for further preoperative risk assessment. Please obtain consent and complete medication review. Thank you for your help.  I confirm that guidance regarding antiplatelet and oral anticoagulation therapy has been completed and, if necessary, noted below.  Per office protocol, if patient is without any new symptoms or concerns at the time of their virtual visit, he may hold aspirin for 5-7 days prior to procedure. Please resume aspirin as soon as possible postprocedure, at the discretion of the surgeon.    I also confirmed the patient resides in the state of [place] . As per [facility] telemedicine laws, the patient must reside in the state in which the provider is licensed.   Ava Boatman, NP 10/20/2023, 9:58 AM [facility]

## 2023-10-20 NOTE — Telephone Encounter (Signed)
 Pt has been scheduled 12/12/23 tele preop appt. Med rec and consent are done.     Patient Consent for Virtual Visit        William Savage has provided verbal consent on 10/20/2023 for a virtual visit (video or telephone).   CONSENT FOR VIRTUAL VISIT FOR:  William Savage  By participating in this virtual visit I agree to the following:  I hereby voluntarily request, consent and authorize [facility] and its employed or contracted physicians, physician assistants, nurse practitioners or other licensed health care professionals (the Practitioner), to provide me with telemedicine health care services (the "Services") as deemed necessary by the treating Practitioner. I acknowledge and consent to receive the Services by the Practitioner via telemedicine. I understand that the telemedicine visit will involve communicating with the Practitioner through live audiovisual communication technology and the disclosure of certain medical information by electronic transmission. I acknowledge that I have been given the opportunity to request an in-person assessment or other available alternative prior to the telemedicine visit and am voluntarily participating in the telemedicine visit.  I understand that I have the right to withhold or withdraw my consent to the use of telemedicine in the course of my care at any time, without affecting my right to future care or treatment, and that the Practitioner or I may terminate the telemedicine visit at any time. I understand that I have the right to inspect all information obtained and/or recorded in the course of the telemedicine visit and may receive copies of available information for a reasonable fee.  I understand that some of the potential risks of receiving the Services via telemedicine include:  Delay or interruption in medical evaluation due to technological equipment failure or disruption; Information transmitted may not be sufficient (e.g. poor resolution of  images) to allow for appropriate medical decision making by the Practitioner; and/or  In rare instances, security protocols could fail, causing a breach of personal health information.  Furthermore, I acknowledge that it is my responsibility to provide information about my medical history, conditions and care that is complete and accurate to the best of my ability. I acknowledge that Practitioner's advice, recommendations, and/or decision may be based on factors not within their control, such as incomplete or inaccurate data provided by me or distortions of diagnostic images or specimens that may result from electronic transmissions. I understand that the practice of medicine is not an exact science and that Practitioner makes no warranties or guarantees regarding treatment outcomes. I acknowledge that a copy of this consent can be made available to me via my patient portal [facility] MyChart), or I can request a printed copy by calling the office of [facility].    I understand that my insurance will be billed for this visit.   I have read or had this consent read to me. I understand the contents of this consent, which adequately explains the benefits and risks of the Services being provided via telemedicine.  I have been provided ample opportunity to ask questions regarding this consent and the Services and have had my questions answered to my satisfaction. I give my informed consent for the services to be provided through the use of telemedicine in my medical care

## 2023-10-20 NOTE — Telephone Encounter (Signed)
 Pt has been scheduled 12/12/23 tele preop appt. Med rec and consent are done.

## 2023-11-01 ENCOUNTER — Telehealth: Payer: Self-pay | Admitting: Cardiology

## 2023-11-01 NOTE — Telephone Encounter (Signed)
 Patient wants to switch from Dr. Albert Huff to Dr. Berry Bristol.

## 2023-11-02 NOTE — Telephone Encounter (Signed)
I'm okay with this.

## 2023-11-02 NOTE — Telephone Encounter (Signed)
 Sounds like a plan. TY.   ST

## 2023-12-11 ENCOUNTER — Telehealth: Payer: Self-pay | Admitting: Cardiology

## 2023-12-11 ENCOUNTER — Telehealth: Payer: Self-pay

## 2023-12-11 NOTE — Telephone Encounter (Signed)
 Paper Work Dropped Off: Preop Clearance Paper Work  Date: 12/11/23  Location of paper:  Fax/ Provider Mailbox

## 2023-12-11 NOTE — Progress Notes (Unsigned)
 Virtual Visit via Telephone Note   Because of Demarquez Ciolek co-morbid illnesses, he is at least at moderate risk for complications without adequate follow up.  This format is felt to be most appropriate for this patient at this time.  Due to technical limitations with video connection (technology), today's appointment will be conducted as an audio only telehealth visit, and Blayton Huttner verbally agreed to proceed in this manner.   All issues noted in this document were discussed and addressed.  No physical exam could be performed with this format.  Evaluation Performed:  Preoperative cardiovascular risk assessment _____________   Date:  12/11/2023   Patient ID:  William Savage, DOB 10/27/1960, MRN [id number] Patient Location:  Home Provider location:   Office  Primary Care Provider:  Nichole Senior, MD Primary Cardiologist:  Madonna Large, DO  Chief Complaint / Patient Profile   63 y.o. y/o male with a h/o CAD, HLD, HTN, OSA, TIA who is pending total knee arthroplasty and presents today for telephonic preoperative cardiovascular risk assessment.  History of Present Illness    William Savage is a 63 y.o. male who presents via audio/video conferencing for a telehealth visit today.  Pt was last seen in cardiology clinic on 02/06/2023 by Dr. Large.  At that time Jojo Pehl was doing well .  The patient is now pending procedure as outlined above. Since his last visit, he remains stable from a cardiac standpoint.  Today he denies chest pain, shortness of breath, lower extremity edema, fatigue, palpitations, melena, hematuria, hemoptysis, diaphoresis, weakness, presyncope, syncope, orthopnea, and PND.   Past Medical History    Past Medical History:  Diagnosis Date   Ankle instability    bilateral   Chronic kidney disease    Diabetes (HCC)    Fatty liver    Hyperlipidemia    Hypertension    IBS (irritable bowel syndrome)    Left shoulder strain    Migraines    Nonobstructive  atherosclerosis of coronary artery    Plantar fasciitis    PTSD (post-traumatic stress disorder)    Radiculopathy    Sleep apnea    TIA (transient ischemic attack)    Past Surgical History:  Procedure Laterality Date   BUNIONECTOMY Right    COLONOSCOPY  2021    Allergies  Allergies  Allergen Reactions   Mushroom Extract Complex (Obsolete) Other (See Comments)    Gout flares, joint pain   Other Other (See Comments)    Allergic to purines    Shellfish Allergy Other (See Comments)    Home Medications    Prior to Admission medications   Medication Sig Start Date End Date Taking? Authorizing Provider  acetaminophen (TYLENOL) 500 MG tablet Take 1-2 tablets by mouth every 6 (six) hours as needed. 03/22/23   [provider]  allopurinol  (ZYLOPRIM ) 300 MG tablet Take 1 tablet (300 mg total) by mouth daily. 10/04/23   Rice, Lonni ORN, MD  amLODipine  (NORVASC ) 10 MG tablet Take by mouth. 11/03/22   [provider]  aspirin 81 MG chewable tablet Chew 81 mg by mouth daily.    [provider]  Calcium  Polycarbophil (FIBER) 625 MG TABS Take by mouth. 08/03/23   [provider]  celecoxib (CELEBREX) 100 MG capsule Take by mouth. 07/06/23   [provider]  Cholecalciferol 100 MCG (4000 UT) TABS Take by mouth. 04/10/23   [provider]  colchicine 0.6 MG tablet Take 0.6 mg by mouth daily. As needed for gout  [provider]  cyclobenzaprine (FLEXERIL) 10 MG tablet Take 10 mg by mouth 3 (three) times daily. 11/30/22   [provider]  diclofenac Sodium (VOLTAREN) 1 % GEL Apply topically. 09/01/22   [provider]  dicyclomine (BENTYL) 10 MG capsule Take by mouth. 08/03/23   [provider]  Docusate Sodium (DSS) 100 MG CAPS Take by mouth. 06/22/23   [provider]  DULoxetine (CYMBALTA) 60 MG capsule Take by mouth. 12/19/22   [provider]  DULoxetine HCl 40 MG CPEP 1 capsule Orally Once  a day Patient not taking: Reported on 10/20/2023    [provider]  ezetimibe (ZETIA) 10 MG tablet Take 10 mg by mouth daily. 10/11/23   [provider]  GARLIC OIL PO Take by mouth. 04/08/15   [provider]  Ginger, Zingiber officinalis, (GINGER PO) Take by mouth. 04/08/15   [provider]  HYDROcodone-acetaminophen (NORCO/VICODIN) 5-325 MG tablet Take 1 tablet by mouth every 4 (four) hours as needed. 03/07/23   [provider]  hydrocortisone-pramoxine (PROCTOFOAM-HC) rectal foam INSERT 1 APPLICATORFUL RECTALLY TWICE A DAY AS NEEDED FOR HEMORRHOIDS 08/04/21   [provider]  Incontinence Supply Disposable (FITTED BRIEFS MAXIMUM LARGE) MISC by Does not apply route.    [provider]  losartan  (COZAAR ) 100 MG tablet Take by mouth. Patient not taking: Reported on 03/27/2023 02/06/23   [provider]  losartan -hydrochlorothiazide (HYZAAR) 100-25 MG tablet Take 1 tablet by mouth every morning. 11/12/20   Ladona Heinz, MD  melatonin 3 MG TABS tablet Take by mouth. 02/22/23   [provider]  meloxicam  (MOBIC ) 15 MG tablet TAKE 1 TABLET BY MOUTH EVERY DAY AS NEEDED FOR PAIN 06/05/23   McCaughan, Dia D, DPM  methocarbamol (ROBAXIN) 500 MG tablet Take by mouth. 07/06/23   [provider]  methylPREDNISolone (MEDROL DOSEPAK) 4 MG TBPK tablet Take by mouth. 01/27/23   [provider]  Multiple Vitamin (MULTIVITAMINS PO) Take by mouth. 04/08/15   [provider]  nitroGLYCERIN  (NITROSTAT ) 0.4 MG SL tablet Place 1 tablet (0.4 mg total) under the tongue every 5 (five) minutes as needed for up to 25 days for chest pain. 01/02/20 08/01/22  Ladona Heinz, MD  pantoprazole (PROTONIX) 40 MG tablet Take 1 tablet by mouth daily. 08/16/22   [provider]  Phenylephrine HCl 0.25 % SUPP Place rectally. 04/08/15   [provider]  predniSONE (STERAPRED UNI-PAK 21 TAB) 10 MG (21) TBPK tablet TAKE 6 TABLETS ON  DAY 1 AS DIRECTED ON PACKAGE AND DECREASE BY 1 TAB EACH DAY FOR A TOTAL OF 6 DAYS Patient not taking: Reported on 10/20/2023 03/04/23   [provider]  Psyllium (METAMUCIL) 48.57 % POWD Take by mouth.    [provider]  Rimegepant Sulfate (NURTEC) 75 MG TBDP Take by mouth. Patient not taking: Reported on 03/27/2023    [provider]  rizatriptan (MAXALT) 10 MG tablet Take by mouth. 01/02/23   [provider]  rosuvastatin  (CRESTOR ) 20 MG tablet Take 1 tablet (20 mg total) by mouth daily. Patient not taking: Reported on 03/27/2023 02/06/23   Tolia, Sunit, DO  rosuvastatin  (CRESTOR ) 40 MG tablet Take by mouth. 01/02/23   [provider]  sildenafil  (VIAGRA ) 100 MG tablet Take 1 tablet (100 mg total) by mouth daily as needed for erectile dysfunction. 01/02/20   Ladona Heinz, MD  tadalafil (CIALIS) 10 MG tablet Take 10 mg by mouth daily as needed for erectile dysfunction.  [provider]  tamsulosin (FLOMAX) 0.4 MG CAPS capsule Take 1 capsule by mouth at bedtime as needed. 05/24/23   [provider]  topiramate (TOPAMAX) 25 MG tablet Take 25 mg by mouth daily. Patient not taking: Reported on 10/20/2023    [provider]  topiramate (TOPAMAX) 50 MG tablet 1 and 1/2 (75mg ) Orally Once a day per [facility]    [provider]    Physical Exam    Vital Signs:  Benjermin Korber does not have vital signs available for review today.  Given telephonic nature of communication, physical exam is limited. AAOx3. NAD. Normal affect.  Speech and respirations are unlabored.  Accessory Clinical Findings    None  Assessment & Plan    1.  Preoperative Cardiovascular Risk Assessment: Procedure:  Left total knee arthroplasty   Date of Surgery:  Clearance 01/02/24                                  Surgeon:  Dr. Donnice Car Surgeon's Group or Practice Name:  [facility]  Phone number:  [phone] Fax number:  [phone]    Primary  Cardiologist: Madonna Large, DO  Chart reviewed as part of pre-operative protocol coverage. Given past medical history and time since last visit, based on ACC/AHA guidelines, Kapena Hamme would be at acceptable risk for the planned procedure without further cardiovascular testing.   His RCRI is low risk, 0.9% risk of major cardiac event.  He is able to complete greater than 4 METS of physical activity.  Patient was advised that if he develops new symptoms prior to surgery to contact our office to arrange a follow-up appointment.  He verbalized understanding.  Per office protocol, if patient is without any new symptoms or concerns at the time of their virtual visit, he may hold aspirin for 5-7 days prior to procedure. Please resume aspirin as soon as possible postprocedure, at the discretion of the surgeon.    I will route this recommendation to the requesting party via Epic fax function and remove from pre-op pool.       Time:   Today, I have spent 5 minutes with the patient with telehealth technology discussing medical history, symptoms, and management plan.  I spent 10 minutes reviewing patient's past cardiac history and cardiac medications.    Josefa CHRISTELLA Beauvais, NP  12/11/2023, 8:37 AM

## 2023-12-12 ENCOUNTER — Ambulatory Visit: Attending: Cardiovascular Disease

## 2023-12-12 DIAGNOSIS — Z01810 Encounter for preprocedural cardiovascular examination: Secondary | ICD-10-CM | POA: Diagnosis not present
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
## 2023-12-12 NOTE — Telephone Encounter (Signed)
 Pt has been scheduled 12/12/23 tele preop appt. Med rec and consent are done.        Not sure if the paper work for surgery clearance is the request we have already in process for knee surgery. Pt has tele appt today as well.

## 2023-12-21 NOTE — Progress Notes (Addendum)
 COVID Vaccine Completed: yes  Date of COVID positive in last 90 days:  PCP - Garnette Ore, MD and Dr. Volanda at [facility] Cardiologist - Dr. Ladona  Cardiac clearance by Josefa Beauvais, NP 12/11/23 Epic  Chest x-ray - n/a EKG - 02/06/23 Epic Stress Test - 12/18/19 Epic ECHO - 07/12/22 [facility] Cardiac Cath - 07/12/22 [facility] Pacemaker/ICD device last checked: n/a Spinal Cord Stimulator:n/a  Bowel Prep - no  Sleep Study - yes  CPAP - yes every night  Fasting Blood Sugar - preDM, no meds Checks Blood Sugar check BS once a week  Last dose of GLP1 agonist-  N/A GLP1 instructions:  Do not take after     Last dose of SGLT-2 inhibitors-  N/A SGLT-2 instructions:  Do not take after     Blood Thinner Instructions:  Last dose:   Time: Aspirin Instructions: ASA 81, hold 5 days Last Dose:  12/27/23  Activity level: Can perform activities of daily living without stopping and without symptoms of chest pain or shortness of breath. No stairs currently due to knee pain. Ambulates with walker or ane PRN. Uses knee brace  Anesthesia review: mild CAD, DM, TIA, HTN, CKD, OSA  Patient denies shortness of breath, fever, cough and chest pain at PAT appointment  Patient verbalized understanding of instructions that were given to them at the PAT appointment. Patient was also instructed that they will need to review over the PAT instructions again at home before surgery.

## 2023-12-21 NOTE — Patient Instructions (Addendum)
 SURGICAL WAITING ROOM VISITATION  Patients having surgery or a procedure may have no more than 2 support people in the waiting area - these visitors may rotate.    Children under the age of 50 must have an adult with them who is not the patient.  Visitors with respiratory illnesses are discouraged from visiting and should remain at home.  If the patient needs to stay at the hospital during part of their recovery, the visitor guidelines for inpatient rooms apply. Pre-op nurse will coordinate an appropriate time for 1 support person to accompany patient in pre-op.  This support person may not rotate.    Please refer to the [facility] website for the visitor guidelines for Inpatients (after your surgery is over and you are in a regular room).    Your procedure is scheduled on: 01/02/24   Report to [facility] Main Entrance    Report to admitting at 7:25 AM   Call this number if you have problems the morning of surgery [phone]   Do not eat food :After Midnight.   After Midnight you may have the following liquids until 6:55 AM DAY OF SURGERY  Water Non-Citrus Juices (without pulp, NO RED-Apple, White grape, White cranberry) Black Coffee (NO MILK/CREAM OR CREAMERS, sugar ok)  Clear Tea (NO MILK/CREAM OR CREAMERS, sugar ok) regular and decaf                             Plain Jell-O (NO RED)                                           Fruit ices (not with fruit pulp, NO RED)                                     Popsicles (NO RED)                                                               Sports drinks like Gatorade (NO RED    The day of surgery:  Drink ONE (1) Pre-Surgery G2 at 6:55 AM the morning of surgery. Drink in one sitting. Do not sip.  This drink was given to you during your hospital  pre-op appointment visit. Nothing else to drink after completing the  Pre-Surgery G2.          If you have questions, please contact your surgeon's office.   FOLLOW BOWEL PREP  AND ANY ADDITIONAL PRE OP INSTRUCTIONS YOU RECEIVED FROM YOUR SURGEON'S OFFICE!!!     Oral Hygiene is also important to reduce your risk of infection.                                    Remember - BRUSH YOUR TEETH THE MORNING OF SURGERY WITH YOUR REGULAR TOOTHPASTE  DENTURES WILL BE REMOVED PRIOR TO SURGERY PLEASE DO NOT APPLY Poly grip OR ADHESIVES!!!   Stop all vitamins and herbal supplements 7 days before surgery.  Take these medicines the morning of surgery with A SIP OF WATER: Tylenol, Hydrocodone if needed  Bring CPAP mask and tubing day of surgery.                              You may not have any metal on your body including jewelry, and body piercing             Do not wear lotions, powders, cologne, or deodorant              Men may shave face and neck.   Do not bring valuables to the hospital. [facility] IS NOT             RESPONSIBLE   FOR VALUABLES.   Contacts, glasses, dentures or bridgework may not be worn into surgery.   Bring small overnight bag day of surgery.   DO NOT BRING YOUR HOME MEDICATIONS TO THE HOSPITAL. PHARMACY WILL DISPENSE MEDICATIONS LISTED ON YOUR MEDICATION LIST TO YOU DURING YOUR ADMISSION IN THE HOSPITAL!   Special Instructions: Bring a copy of your healthcare power of attorney and living will documents the day of surgery if you haven't scanned them before.              Please read over the following fact sheets you were given: IF YOU HAVE QUESTIONS ABOUT YOUR PRE-OP INSTRUCTIONS PLEASE CALL [phone]    If you received a COVID test during your pre-op visit  it is requested that you wear a mask when out in public, stay away from anyone that may not be feeling well and notify your surgeon if you develop symptoms. If you test positive for Covid or have been in contact with anyone that has tested positive in the last 10 days please notify you surgeon.      Pre-operative 5 CHG Bath Instructions   You can play a key role in  reducing the risk of infection after surgery. Your skin needs to be as free of germs as possible. You can reduce the number of germs on your skin by washing with CHG (chlorhexidine gluconate) soap before surgery. CHG is an antiseptic soap that kills germs and continues to kill germs even after washing.   DO NOT use if you have an allergy to chlorhexidine/CHG or antibacterial soaps. If your skin becomes reddened or irritated, stop using the CHG and notify one of our RNs at [phone].   Please shower with the CHG soap starting 4 days before surgery using the following schedule:     Please keep in mind the following:  DO NOT shave, including legs and underarms, starting the day of your first shower.   You may shave your face at any point before/day of surgery.  Place clean sheets on your bed the day you start using CHG soap. Use a clean washcloth (not used since being washed) for each shower. DO NOT sleep with pets once you start using the CHG.   CHG Shower Instructions:  If you choose to wash your hair and private area, wash first with your normal shampoo/soap.  After you use shampoo/soap, rinse your hair and body thoroughly to remove shampoo/soap residue.  Turn the water OFF and apply about 3 tablespoons (45 ml) of CHG soap to a CLEAN washcloth.  Apply CHG soap ONLY FROM YOUR NECK DOWN TO YOUR TOES (washing for 3-5 minutes)  DO NOT use CHG soap on face, private areas,  open wounds, or sores.  Pay special attention to the area where your surgery is being performed.  If you are having back surgery, having someone wash your back for you may be helpful. Wait 2 minutes after CHG soap is applied, then you may rinse off the CHG soap.  Pat dry with a clean towel  Put on clean clothes/pajamas   If you choose to wear lotion, please use ONLY the CHG-compatible lotions on the back of this paper.     Additional instructions for the day of surgery: DO NOT APPLY any lotions, deodorants, cologne, or  perfumes.   Put on clean/comfortable clothes.  Brush your teeth.  Ask your nurse before applying any prescription medications to the skin.      CHG Compatible Lotions   Aveeno Moisturizing lotion  Cetaphil Moisturizing Cream  Cetaphil Moisturizing Lotion  Clairol Herbal Essence Moisturizing Lotion, Dry Skin  Clairol Herbal Essence Moisturizing Lotion, Extra Dry Skin  Clairol Herbal Essence Moisturizing Lotion, Normal Skin  Curel Age Defying Therapeutic Moisturizing Lotion with Alpha Hydroxy  Curel Extreme Care Body Lotion  Curel Soothing Hands Moisturizing Hand Lotion  Curel Therapeutic Moisturizing Cream, Fragrance-Free  Curel Therapeutic Moisturizing Lotion, Fragrance-Free  Curel Therapeutic Moisturizing Lotion, Original Formula  Eucerin Daily Replenishing Lotion  Eucerin Dry Skin Therapy Plus Alpha Hydroxy Crme  Eucerin Dry Skin Therapy Plus Alpha Hydroxy Lotion  Eucerin Original Crme  Eucerin Original Lotion  Eucerin Plus Crme Eucerin Plus Lotion  Eucerin TriLipid Replenishing Lotion  Keri Anti-Bacterial Hand Lotion  Keri Deep Conditioning Original Lotion Dry Skin Formula Softly Scented  Keri Deep Conditioning Original Lotion, Fragrance Free Sensitive Skin Formula  Keri Lotion Fast Absorbing Fragrance Free Sensitive Skin Formula  Keri Lotion Fast Absorbing Softly Scented Dry Skin Formula  Keri Original Lotion  Keri Skin Renewal Lotion Keri Silky Smooth Lotion  Keri Silky Smooth Sensitive Skin Lotion  Nivea Body Creamy Conditioning Oil  Nivea Body Extra Enriched Lotion  Nivea Body Original Lotion  Nivea Body Sheer Moisturizing Lotion Nivea Crme  Nivea Skin Firming Lotion  NutraDerm 30 Skin Lotion  NutraDerm Skin Lotion  NutraDerm Therapeutic Skin Cream  NutraDerm Therapeutic Skin Lotion  ProShield Protective Hand Cream  Provon moisturizing lotion   View Pre-Surgery Education  Videos:  [email or web address]     Incentive Spirometer  An incentive spirometer is a tool that can help keep your lungs clear and active. This tool measures how well you are filling your lungs with each breath. Taking long deep breaths may help reverse or decrease the chance of developing breathing (pulmonary) problems (especially infection) following: A long period of time when you are unable to move or be active. BEFORE THE PROCEDURE  If the spirometer includes an indicator to show your best effort, your nurse or respiratory therapist will set it to a desired goal. If possible, sit up straight or lean slightly forward. Try not to slouch. Hold the incentive spirometer in an upright position. INSTRUCTIONS FOR USE  Sit on the edge of your bed if possible, or sit up as far as you can in bed or on a chair. Hold the incentive spirometer in an upright position. Breathe out normally. Place the mouthpiece in your mouth and seal your lips tightly around it. Breathe in slowly and as deeply as possible, raising the piston or the ball toward the top of the column. Hold your breath for 3-5 seconds or for as long as possible. Allow the piston or ball to fall to the bottom of  the column. Remove the mouthpiece from your mouth and breathe out normally. Rest for a few seconds and repeat Steps 1 through 7 at least 10 times every 1-2 hours when you are awake. Take your time and take a few normal breaths between deep breaths. The spirometer may include an indicator to show your best effort. Use the indicator as a goal to work toward during each repetition. After each set of 10 deep breaths, practice coughing to be sure your lungs are clear. If you have an incision (the cut made at the time of surgery), support your incision when coughing by placing a pillow or rolled up towels firmly against it. Once you are able to get out of bed, walk around indoors  and cough well. You may stop using the incentive spirometer when instructed by your caregiver.  RISKS AND COMPLICATIONS Take your time so you do not get dizzy or light-headed. If you are in pain, you may need to take or ask for pain medication before doing incentive spirometry. It is harder to take a deep breath if you are having pain. AFTER USE Rest and breathe slowly and easily. It can be helpful to keep track of a log of your progress. Your caregiver can provide you with a simple table to help with this. If you are using the spirometer at home, follow these instructions: SEEK MEDICAL CARE IF:  You are having difficultly using the spirometer. You have trouble using the spirometer as often as instructed. Your pain medication is not giving enough relief while using the spirometer. You develop fever of 100.5 F (38.1 C) or higher. SEEK IMMEDIATE MEDICAL CARE IF:  You cough up bloody sputum that had not been present before. You develop fever of 102 F (38.9 C) or greater. You develop worsening pain at or near the incision site. MAKE SURE YOU:  Understand these instructions. Will watch your condition. Will get help right away if you are not doing well or get worse. Document Released: 10/17/2006 Document Revised: 08/29/2011 Document Reviewed: 12/18/2006 [facility] Patient Information 2014 [place], [place].   ________________________________________________________________________

## 2023-12-25 ENCOUNTER — Encounter (HOSPITAL_COMMUNITY): Payer: Self-pay

## 2023-12-25 ENCOUNTER — Other Ambulatory Visit: Payer: Self-pay

## 2023-12-25 ENCOUNTER — Encounter (HOSPITAL_COMMUNITY)
Admission: RE | Admit: 2023-12-25 | Discharge: 2023-12-25 | Disposition: A | Discharge status: Home / Self Care | Source: Ambulatory Visit | Attending: Orthopedic Surgery | Admitting: Orthopedic Surgery

## 2023-12-25 VITALS — BP 119/77 | HR 58 | Temp 98.1°F | Resp 16 | Ht 72.0 in | Wt 220.0 lb

## 2023-12-25 DIAGNOSIS — I2510 Atherosclerotic heart disease of native coronary artery without angina pectoris: Secondary | ICD-10-CM | POA: Insufficient documentation

## 2023-12-25 DIAGNOSIS — G4733 Obstructive sleep apnea (adult) (pediatric): Secondary | ICD-10-CM | POA: Insufficient documentation

## 2023-12-25 DIAGNOSIS — Z01812 Encounter for preprocedural laboratory examination: Secondary | ICD-10-CM | POA: Insufficient documentation

## 2023-12-25 DIAGNOSIS — G43909 Migraine, unspecified, not intractable, without status migrainosus: Secondary | ICD-10-CM | POA: Insufficient documentation

## 2023-12-25 DIAGNOSIS — I10 Essential (primary) hypertension: Secondary | ICD-10-CM

## 2023-12-25 DIAGNOSIS — N189 Chronic kidney disease, unspecified: Secondary | ICD-10-CM | POA: Diagnosis not present

## 2023-12-25 DIAGNOSIS — F4310 Post-traumatic stress disorder, unspecified: Secondary | ICD-10-CM | POA: Insufficient documentation

## 2023-12-25 DIAGNOSIS — M1712 Unilateral primary osteoarthritis, left knee: Secondary | ICD-10-CM | POA: Diagnosis not present

## 2023-12-25 DIAGNOSIS — G8929 Other chronic pain: Secondary | ICD-10-CM | POA: Insufficient documentation

## 2023-12-25 DIAGNOSIS — I129 Hypertensive chronic kidney disease with stage 1 through stage 4 chronic kidney disease, or unspecified chronic kidney disease: Secondary | ICD-10-CM | POA: Diagnosis not present

## 2023-12-25 DIAGNOSIS — E1122 Type 2 diabetes mellitus with diabetic chronic kidney disease: Secondary | ICD-10-CM | POA: Diagnosis not present

## 2023-12-25 DIAGNOSIS — F1120 Opioid dependence, uncomplicated: Secondary | ICD-10-CM | POA: Insufficient documentation

## 2023-12-25 DIAGNOSIS — Z8673 Personal history of transient ischemic attack (TIA), and cerebral infarction without residual deficits: Secondary | ICD-10-CM | POA: Diagnosis not present

## 2023-12-25 DIAGNOSIS — Z01818 Encounter for other preprocedural examination: Secondary | ICD-10-CM

## 2023-12-25 DIAGNOSIS — M1990 Unspecified osteoarthritis, unspecified site: Secondary | ICD-10-CM | POA: Diagnosis not present

## 2023-12-25 LAB — BASIC METABOLIC PANEL WITH GFR
Anion gap: 7 (ref 5–15)
BUN: 10 mg/dL (ref 6–20)
CO2: 26 mmol/L (ref 22–32)
Calcium: 9.2 mg/dL (ref 8.9–10.3)
Chloride: 108 mmol/L (ref 98–111)
Creatinine, Ser: 1.36 mg/dL — ABNORMAL HIGH (ref 0.61–1.24)
GFR, Estimated: 60 mL/min — ABNORMAL LOW (ref >60)
Glucose, Bld: 94 mg/dL (ref 70–99)
Potassium: 4.3 mmol/L (ref 3.5–5.1)
Sodium: 141 mmol/L (ref 135–145)

## 2023-12-25 LAB — CBC
HCT: 41.8 % (ref 39.0–52.0)
Hemoglobin: 12.9 g/dL — ABNORMAL LOW (ref 13.0–17.0)
MCH: 25.8 pg — ABNORMAL LOW (ref 26.0–34.0)
MCHC: 30.9 g/dL (ref 30.0–36.0)
MCV: 83.6 fL (ref 80.0–100.0)
Platelets: 256 K/uL (ref 150–400)
RBC: 5 MIL/uL (ref 4.22–5.81)
RDW: 15.9 % — ABNORMAL HIGH (ref 11.5–15.5)
WBC: 4.5 K/uL (ref 4.0–10.5)
nRBC: 0 % (ref 0.0–0.2)

## 2023-12-25 LAB — SURGICAL PCR SCREEN
MRSA, PCR: NEGATIVE
Staphylococcus aureus: NEGATIVE

## 2023-12-26 ENCOUNTER — Ambulatory Visit: Admitting: Internal Medicine

## 2023-12-26 DIAGNOSIS — M25462 Effusion, left knee: Secondary | ICD-10-CM

## 2023-12-26 DIAGNOSIS — M175 Other unilateral secondary osteoarthritis of knee: Secondary | ICD-10-CM

## 2023-12-26 DIAGNOSIS — Z5181 Encounter for therapeutic drug level monitoring: Secondary | ICD-10-CM

## 2023-12-26 DIAGNOSIS — M1A09X0 Idiopathic chronic gout, multiple sites, without tophus (tophi): Secondary | ICD-10-CM

## 2023-12-26 NOTE — Progress Notes (Addendum)
 Case: [id number] Date/Time: 01/02/24 0940   Procedure: ARTHROPLASTY, KNEE, TOTAL (Left: Knee)   Anesthesia type: Spinal   Pre-op diagnosis: Left Knee Osteoarthritis   Location: [facility] ROOM 09 / [facility] ORS   Surgeons: Ernie Cough, MD       DISCUSSION: William Savage is a 63 yo male who presents to PAT prior to surgery above. 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 Pt evaluated for chest pain in 2021. Underwent stress test which was nonischemic. Echo in 2021 showed normal LVEF 60-65% with mild-mod LVH. In 06/2022 he was admitted to [facility] with concern for STEMI however cardiac cath showed only mild nonobstructive disease and STEMI ruled out. Echo was repeated and was normal. He had a cardiac clearance tele visit on 12/11/23 and was cleared for surgery:  Chart reviewed as part of pre-operative protocol coverage. Given past medical history and time since last visit, based on ACC/AHA guidelines, William Savage would be at acceptable risk for the planned procedure without further cardiovascular testing.  His RCRI is low risk, 0.9% risk of major cardiac event.  He is able to complete greater than 4 METS of physical activity. Patient was advised that if he develops new symptoms prior to surgery to contact our office to arrange a follow-up appointment.  He verbalized understanding. Per office protocol, if patient is without any new symptoms or concerns at the time of their virtual visit, he may hold aspirin for 5-7 days prior to procedure. Please resume aspirin as soon as possible postprocedure, at the discretion of the surgeon.   VS: BP 119/77   Pulse (!) 58   Temp 36.7 C (Oral)   Resp 16   Ht 6' (1.829 m)   Wt 99.8 kg   SpO2 100%   BMI 29.84 kg/m   PROVIDERS: Clinic, [facility] Cardiologist - Dr. Ladona  LABS: Labs reviewed: Acceptable for  surgery. (all labs ordered are listed, but only abnormal results are displayed)  Labs Reviewed  BASIC METABOLIC PANEL WITH GFR - Abnormal; Notable for the following components:      Result Value   Creatinine, Ser 1.36 (*)    GFR, Estimated 60 (*)    All other components within normal limits  CBC - Abnormal; Notable for the following components:   Hemoglobin 12.9 (*)    MCH 25.8 (*)    RDW 15.9 (*)    All other components within normal limits  SURGICAL PCR SCREEN     IMAGES:   EKG:   CV:  LHC 07/12/22: ([facility]):  Conclusion: Mild non-obstructive CAD as mentioned above. Normal LVEDP Normal LV systolic function with normal wall motion Acute coronary syndrome ruled out  Echo 07/13/22 ([facility]):  Impression Normal ventricular size and function; EF 55-60%. 2.  Normal LV diastolic function. 3.  Normal right ventricular size with mild reduction in function. 4.  No significant valvular abnormalities.  Exercise Sestamibi stress test 12/18/2019: Exercise nuclear stress test was performed using Bruce protocol. Patient reached 13.2 METS, and 88% of age predicted maximum heart rate. Exercise capacity was excellent. Chest pain not reported. Heart rate and hemodynamic response were normal. Stress EKG revealed no ischemic changes. Normal myocardial perfusion. Stress LVEF 53%. Low risk study.   Echocardiogram 11/28/2019: Normal LV systolic function with visual EF 60-65%. Left ventricle cavity is normal in size. Mild to moderate hypertrophy of the left ventricle. Normal  global wall motion. Normal diastolic filling pattern, normal LAP. Calculated EF 59%. Mild pulmonic regurgitation. No prior study for comparison. Past Medical History:  Diagnosis Date   Ankle instability    bilateral   Chronic kidney disease    Diabetes (HCC)    Fatty liver    Fatty liver    Hyperlipidemia    Hypertension    IBS (irritable bowel syndrome)    Left shoulder strain    Migraines     Nonobstructive atherosclerosis of coronary artery    Plantar fasciitis    PTSD (post-traumatic stress disorder)    Radiculopathy    Sleep apnea    TIA (transient ischemic attack)     Past Surgical History:  Procedure Laterality Date   BUNIONECTOMY Right    COLONOSCOPY  2021    MEDICATIONS:  acetaminophen (TYLENOL) 500 MG tablet   amLODipine  (NORVASC ) 10 MG tablet   Ascorbic Acid (VITAMIN C PO)   aspirin 81 MG chewable tablet   celecoxib (CELEBREX) 100 MG capsule   Cholecalciferol 100 MCG (4000 UT) TABS   colchicine 0.6 MG tablet   diclofenac Sodium (VOLTAREN) 1 % GEL   dicyclomine (BENTYL) 10 MG capsule   Docusate Sodium (DSS) 100 MG CAPS   DULoxetine (CYMBALTA) 30 MG capsule   ezetimibe (ZETIA) 10 MG tablet   GARLIC OIL PO   Ginger, Zingiber officinalis, (GINGER PO)   HYDROcodone-acetaminophen (NORCO/VICODIN) 5-325 MG tablet   losartan  (COZAAR ) 100 MG tablet   melatonin 3 MG TABS tablet   methocarbamol (ROBAXIN) 500 MG tablet   methylPREDNISolone (MEDROL DOSEPAK) 4 MG TBPK tablet   Multiple Vitamin (MULTIVITAMINS PO)   nitroGLYCERIN  (NITROSTAT ) 0.4 MG SL tablet   polycarbophil (FIBERCON) 625 MG tablet   Psyllium (METAMUCIL) 48.57 % POWD   rizatriptan (MAXALT) 10 MG tablet   rosuvastatin  (CRESTOR ) 20 MG tablet   tadalafil (CIALIS) 10 MG tablet   topiramate (TOPAMAX) 25 MG tablet   No current facility-administered medications for this encounter.   Burnard CHRISTELLA Odis DEVONNA [facility] Surgical Short Stay/Anesthesiology [facility] Phone [phone] 12/26/2023 1:42 PM

## 2023-12-26 NOTE — Anesthesia Preprocedure Evaluation (Addendum)
 Anesthesia Evaluation  Patient identified by MRN, date of birth, ID band Patient awake    Reviewed: Allergy & Precautions, NPO status , Patient's Chart, lab work & pertinent test results  Airway Mallampati: II  TM Distance: >3 FB Neck ROM: Full    Dental  (+) Teeth Intact, Dental Advisory Given, Caps,    Pulmonary sleep apnea and Continuous Positive Airway Pressure Ventilation    Pulmonary exam normal breath sounds clear to auscultation       Cardiovascular hypertension, Pt. on medications + CAD  Normal cardiovascular exam Rhythm:Regular Rate:Normal     Neuro/Psych  Headaches PSYCHIATRIC DISORDERS Anxiety     TIA Neuromuscular disease    GI/Hepatic negative GI ROS, Neg liver ROS,,,  Endo/Other  diabetes, Type 2    Renal/GU Renal InsufficiencyRenal disease     Musculoskeletal  (+) Arthritis ,    Abdominal   Peds  Hematology  (+) Blood dyscrasia, anemia Plt 256k   Anesthesia Other Findings Day of surgery medications reviewed with the patient.  Reproductive/Obstetrics                              Anesthesia Physical Anesthesia Plan  ASA: 3  Anesthesia Plan: Spinal   Post-op Pain Management: Regional block*, Tylenol  PO (pre-op)* and Toradol  IV (intra-op)*   Induction: Intravenous  PONV Risk Score and Plan: 1 and Midazolam , Dexamethasone , TIVA and Ondansetron   Airway Management Planned: Natural Airway and Simple Face Mask  Additional Equipment:   Intra-op Plan:   Post-operative Plan:   Informed Consent: I have reviewed the patients History and Physical, chart, labs and discussed the procedure including the risks, benefits and alternatives for the proposed anesthesia with the patient or authorized representative who has indicated his/her understanding and acceptance.     Dental advisory given  Plan Discussed with: CRNA  Anesthesia Plan Comments: (See PAT note from 7/7)          Anesthesia Quick Evaluation

## 2024-01-02 ENCOUNTER — Ambulatory Visit (HOSPITAL_COMMUNITY): Payer: Self-pay | Admitting: Medical

## 2024-01-02 ENCOUNTER — Other Ambulatory Visit: Payer: Self-pay

## 2024-01-02 ENCOUNTER — Encounter (HOSPITAL_COMMUNITY): Payer: Self-pay | Admitting: Orthopedic Surgery

## 2024-01-02 ENCOUNTER — Observation Stay (HOSPITAL_COMMUNITY)
Admission: RE | Admit: 2024-01-02 | Discharge: 2024-01-03 | Disposition: A | Discharge status: Home / Self Care | Source: Ambulatory Visit | Attending: Orthopedic Surgery | Admitting: Orthopedic Surgery

## 2024-01-02 ENCOUNTER — Encounter (HOSPITAL_COMMUNITY)
Admission: RE | Disposition: A | Discharge status: Home / Self Care | Payer: Self-pay | Source: Ambulatory Visit | Attending: Orthopedic Surgery

## 2024-01-02 ENCOUNTER — Ambulatory Visit (HOSPITAL_BASED_OUTPATIENT_CLINIC_OR_DEPARTMENT_OTHER): Admitting: Anesthesiology

## 2024-01-02 DIAGNOSIS — Z79899 Other long term (current) drug therapy: Secondary | ICD-10-CM | POA: Diagnosis not present

## 2024-01-02 DIAGNOSIS — I2510 Atherosclerotic heart disease of native coronary artery without angina pectoris: Secondary | ICD-10-CM

## 2024-01-02 DIAGNOSIS — M1712 Unilateral primary osteoarthritis, left knee: Secondary | ICD-10-CM

## 2024-01-02 DIAGNOSIS — Z7982 Long term (current) use of aspirin: Secondary | ICD-10-CM | POA: Insufficient documentation

## 2024-01-02 DIAGNOSIS — I10 Essential (primary) hypertension: Secondary | ICD-10-CM | POA: Diagnosis not present

## 2024-01-02 DIAGNOSIS — G4733 Obstructive sleep apnea (adult) (pediatric): Secondary | ICD-10-CM

## 2024-01-02 DIAGNOSIS — Z96652 Presence of left artificial knee joint: Principal | ICD-10-CM

## 2024-01-02 DIAGNOSIS — N189 Chronic kidney disease, unspecified: Secondary | ICD-10-CM | POA: Diagnosis not present

## 2024-01-02 DIAGNOSIS — E1122 Type 2 diabetes mellitus with diabetic chronic kidney disease: Secondary | ICD-10-CM | POA: Diagnosis not present

## 2024-01-02 DIAGNOSIS — Z8673 Personal history of transient ischemic attack (TIA), and cerebral infarction without residual deficits: Secondary | ICD-10-CM | POA: Diagnosis not present

## 2024-01-02 DIAGNOSIS — I129 Hypertensive chronic kidney disease with stage 1 through stage 4 chronic kidney disease, or unspecified chronic kidney disease: Secondary | ICD-10-CM | POA: Diagnosis not present

## 2024-01-02 HISTORY — PX: TOTAL KNEE ARTHROPLASTY: SHX125

## 2024-01-02 LAB — GLUCOSE, CAPILLARY: Glucose-Capillary: 86 mg/dL (ref 70–99)

## 2024-01-02 SURGERY — ARTHROPLASTY, KNEE, TOTAL
Anesthesia: Spinal | Site: Knee | Laterality: Left

## 2024-01-02 MED ORDER — ALUM & MAG HYDROXIDE-SIMETH 200-200-20 MG/5ML PO SUSP: 30.0000 mL | ORAL | Status: DC | PRN

## 2024-01-02 MED ORDER — LOSARTAN POTASSIUM 50 MG PO TABS
100.0000 mg | ORAL_TABLET | Freq: Every day | ORAL | Status: DC
  Administered 2024-01-03: 100 mg via ORAL
  Filled 2024-01-02: qty 2

## 2024-01-02 MED ORDER — BISACODYL 10 MG RE SUPP: 10.0000 mg | Freq: Every day | RECTAL | Status: DC | PRN

## 2024-01-02 MED ORDER — AMLODIPINE BESYLATE 10 MG PO TABS
10.0000 mg | ORAL_TABLET | Freq: Every day | ORAL | Status: DC
  Administered 2024-01-02 – 2024-01-03 (×2): 10 mg via ORAL
  Filled 2024-01-02 (×2): qty 1

## 2024-01-02 MED ORDER — METOCLOPRAMIDE HCL 5 MG PO TABS: 5.0000 mg | ORAL_TABLET | Freq: Three times a day (TID) | ORAL | Status: DC | PRN

## 2024-01-02 MED ORDER — MIDAZOLAM HCL 2 MG/2ML IJ SOLN
1.0000 mg | Freq: Once | INTRAMUSCULAR | Status: AC
  Administered 2024-01-02: 2 mg via INTRAVENOUS
  Filled 2024-01-02: qty 2

## 2024-01-02 MED ORDER — MIDAZOLAM HCL 2 MG/2ML IJ SOLN
INTRAMUSCULAR | Status: AC
  Filled 2024-01-02: qty 2

## 2024-01-02 MED ORDER — SODIUM CHLORIDE 0.9 % IR SOLN
Status: DC | PRN
  Administered 2024-01-02: 1000 mL

## 2024-01-02 MED ORDER — GLYCOPYRROLATE 0.2 MG/ML IJ SOLN
INTRAMUSCULAR | Status: AC
  Filled 2024-01-02: qty 1

## 2024-01-02 MED ORDER — GLYCOPYRROLATE 0.2 MG/ML IJ SOLN
INTRAMUSCULAR | Status: DC | PRN
  Administered 2024-01-02: .2 mg via INTRAVENOUS

## 2024-01-02 MED ORDER — ONDANSETRON HCL 4 MG PO TABS: 4.0000 mg | ORAL_TABLET | Freq: Four times a day (QID) | ORAL | Status: DC | PRN

## 2024-01-02 MED ORDER — MELATONIN 3 MG PO TABS: 3.0000 mg | ORAL_TABLET | Freq: Every evening | ORAL | Status: DC | PRN

## 2024-01-02 MED ORDER — SODIUM CHLORIDE (PF) 0.9 % IJ SOLN
INTRAMUSCULAR | Status: AC
  Filled 2024-01-02: qty 50

## 2024-01-02 MED ORDER — AMISULPRIDE (ANTIEMETIC) 5 MG/2ML IV SOLN: 10.0000 mg | Freq: Once | INTRAVENOUS | Status: DC | PRN

## 2024-01-02 MED ORDER — EPHEDRINE SULFATE-NACL 50-0.9 MG/10ML-% IV SOSY
PREFILLED_SYRINGE | INTRAVENOUS | Status: DC | PRN
  Administered 2024-01-02 (×2): 10 mg via INTRAVENOUS

## 2024-01-02 MED ORDER — SODIUM CHLORIDE (PF) 0.9 % IJ SOLN
INTRAMUSCULAR | Status: DC | PRN
  Administered 2024-01-02: 30 mL via INTRAVENOUS

## 2024-01-02 MED ORDER — DULOXETINE HCL 60 MG PO CPEP
90.0000 mg | ORAL_CAPSULE | Freq: Every day | ORAL | Status: DC
  Administered 2024-01-02 – 2024-01-03 (×2): 90 mg via ORAL
  Filled 2024-01-02 (×2): qty 1

## 2024-01-02 MED ORDER — STERILE WATER FOR IRRIGATION IR SOLN
Status: DC | PRN
  Administered 2024-01-02: 1000 mL

## 2024-01-02 MED ORDER — FENTANYL CITRATE PF 50 MCG/ML IJ SOSY
50.0000 ug | PREFILLED_SYRINGE | Freq: Once | INTRAMUSCULAR | Status: AC
  Administered 2024-01-02: 50 ug via INTRAVENOUS
  Filled 2024-01-02: qty 2

## 2024-01-02 MED ORDER — ONDANSETRON HCL 4 MG/2ML IJ SOLN
INTRAMUSCULAR | Status: AC
Start: 2024-01-02 — End: 2024-01-02
  Filled 2024-01-02: qty 2

## 2024-01-02 MED ORDER — TRANEXAMIC ACID-NACL 1000-0.7 MG/100ML-% IV SOLN
1000.0000 mg | INTRAVENOUS | Status: AC
  Administered 2024-01-02: 1000 mg via INTRAVENOUS
  Filled 2024-01-02: qty 100

## 2024-01-02 MED ORDER — PROPOFOL 500 MG/50ML IV EMUL
INTRAVENOUS | Status: DC | PRN
  Administered 2024-01-02: 50 mg via INTRAVENOUS
  Administered 2024-01-02: 100 ug/kg/min via INTRAVENOUS

## 2024-01-02 MED ORDER — SODIUM CHLORIDE 0.9% FLUSH: 3.0000 mL | INTRAVENOUS | Status: DC | PRN

## 2024-01-02 MED ORDER — BUPIVACAINE-EPINEPHRINE (PF) 0.25% -1:200000 IJ SOLN
INTRAMUSCULAR | Status: AC
  Filled 2024-01-02: qty 30

## 2024-01-02 MED ORDER — HYDROMORPHONE HCL 1 MG/ML IJ SOLN: 0.5000 mg | INTRAMUSCULAR | Status: DC | PRN

## 2024-01-02 MED ORDER — ACETAMINOPHEN 500 MG PO TABS
1000.0000 mg | ORAL_TABLET | Freq: Once | ORAL | Status: AC
  Administered 2024-01-02: 1000 mg via ORAL
  Filled 2024-01-02: qty 2

## 2024-01-02 MED ORDER — ACETAMINOPHEN 500 MG PO TABS
1000.0000 mg | ORAL_TABLET | Freq: Four times a day (QID) | ORAL | Status: DC
  Administered 2024-01-02 – 2024-01-03 (×4): 1000 mg via ORAL
  Filled 2024-01-02 (×5): qty 2

## 2024-01-02 MED ORDER — TOPIRAMATE 25 MG PO TABS
25.0000 mg | ORAL_TABLET | Freq: Every day | ORAL | Status: DC | PRN
Start: 2024-01-02 — End: 2024-01-03

## 2024-01-02 MED ORDER — CEFAZOLIN SODIUM-DEXTROSE 2-4 GM/100ML-% IV SOLN
2.0000 g | Freq: Four times a day (QID) | INTRAVENOUS | Status: AC
  Administered 2024-01-02 (×2): 2 g via INTRAVENOUS
  Filled 2024-01-02 (×2): qty 100

## 2024-01-02 MED ORDER — ONDANSETRON HCL 4 MG/2ML IJ SOLN
INTRAMUSCULAR | Status: DC | PRN
Start: 2024-01-02 — End: 2024-01-02
  Administered 2024-01-02: 4 mg via INTRAVENOUS

## 2024-01-02 MED ORDER — DICYCLOMINE HCL 10 MG PO CAPS: 10.0000 mg | ORAL_CAPSULE | Freq: Four times a day (QID) | ORAL | Status: DC | PRN

## 2024-01-02 MED ORDER — ONDANSETRON HCL 4 MG/2ML IJ SOLN: 4.0000 mg | Freq: Once | INTRAMUSCULAR | Status: DC | PRN

## 2024-01-02 MED ORDER — CEFAZOLIN SODIUM-DEXTROSE 2-4 GM/100ML-% IV SOLN
2.0000 g | INTRAVENOUS | Status: AC
  Administered 2024-01-02: 2 g via INTRAVENOUS
  Filled 2024-01-02: qty 100

## 2024-01-02 MED ORDER — PHENYLEPHRINE HCL (PRESSORS) 10 MG/ML IV SOLN
INTRAVENOUS | Status: AC
  Filled 2024-01-02: qty 1

## 2024-01-02 MED ORDER — OXYCODONE HCL 5 MG PO TABS
10.0000 mg | ORAL_TABLET | ORAL | Status: DC | PRN
  Administered 2024-01-02: 10 mg via ORAL

## 2024-01-02 MED ORDER — KETOROLAC TROMETHAMINE 30 MG/ML IJ SOLN
INTRAMUSCULAR | Status: DC | PRN
  Administered 2024-01-02: 30 mg via INTRAMUSCULAR

## 2024-01-02 MED ORDER — METHOCARBAMOL 500 MG PO TABS
500.0000 mg | ORAL_TABLET | Freq: Four times a day (QID) | ORAL | Status: DC | PRN
  Administered 2024-01-02 – 2024-01-03 (×2): 500 mg via ORAL
  Filled 2024-01-02 (×2): qty 1

## 2024-01-02 MED ORDER — SODIUM CHLORIDE 0.9% FLUSH
3.0000 mL | Freq: Two times a day (BID) | INTRAVENOUS | Status: DC
  Administered 2024-01-02: 3 mL via INTRAVENOUS
  Administered 2024-01-03: 10 mL via INTRAVENOUS

## 2024-01-02 MED ORDER — EPHEDRINE 5 MG/ML INJ
INTRAVENOUS | Status: AC
  Filled 2024-01-02: qty 5

## 2024-01-02 MED ORDER — DIPHENHYDRAMINE HCL 12.5 MG/5ML PO ELIX: 12.5000 mg | ORAL_SOLUTION | ORAL | Status: DC | PRN

## 2024-01-02 MED ORDER — TRANEXAMIC ACID-NACL 1000-0.7 MG/100ML-% IV SOLN
1000.0000 mg | Freq: Once | INTRAVENOUS | Status: AC
  Administered 2024-01-02: 1000 mg via INTRAVENOUS
  Filled 2024-01-02: qty 100

## 2024-01-02 MED ORDER — ROSUVASTATIN CALCIUM 20 MG PO TABS
20.0000 mg | ORAL_TABLET | Freq: Every day | ORAL | Status: DC
  Administered 2024-01-02 – 2024-01-03 (×2): 20 mg via ORAL
  Filled 2024-01-02 (×2): qty 1

## 2024-01-02 MED ORDER — METHOCARBAMOL 1000 MG/10ML IJ SOLN: 500.0000 mg | Freq: Four times a day (QID) | INTRAMUSCULAR | Status: DC | PRN

## 2024-01-02 MED ORDER — PHENOL 1.4 % MT LIQD: 1.0000 | OROMUCOSAL | Status: DC | PRN

## 2024-01-02 MED ORDER — BUPIVACAINE-EPINEPHRINE (PF) 0.25% -1:200000 IJ SOLN
INTRAMUSCULAR | Status: DC | PRN
  Administered 2024-01-02: 30 mL

## 2024-01-02 MED ORDER — MENTHOL 3 MG MT LOZG
1.0000 | LOZENGE | OROMUCOSAL | Status: DC | PRN
Start: 2024-01-02 — End: 2024-01-03

## 2024-01-02 MED ORDER — CLONIDINE HCL (ANALGESIA) 100 MCG/ML EP SOLN
EPIDURAL | Status: DC | PRN
Start: 2024-01-02 — End: 2024-01-02
  Administered 2024-01-02: 50 ug

## 2024-01-02 MED ORDER — ROPIVACAINE HCL 5 MG/ML IJ SOLN
INTRAMUSCULAR | Status: DC | PRN
  Administered 2024-01-02: 20 mL via PERINEURAL

## 2024-01-02 MED ORDER — PROPOFOL 10 MG/ML IV BOLUS
INTRAVENOUS | Status: AC
  Filled 2024-01-02: qty 20

## 2024-01-02 MED ORDER — METOCLOPRAMIDE HCL 5 MG/ML IJ SOLN: 5.0000 mg | Freq: Three times a day (TID) | INTRAMUSCULAR | Status: DC | PRN

## 2024-01-02 MED ORDER — LACTATED RINGERS IV SOLN: INTRAVENOUS | Status: DC

## 2024-01-02 MED ORDER — KETOROLAC TROMETHAMINE 30 MG/ML IJ SOLN
INTRAMUSCULAR | Status: AC
Start: 2024-01-02 — End: 2024-01-02
  Filled 2024-01-02: qty 1

## 2024-01-02 MED ORDER — POVIDONE-IODINE 10 % EX SWAB: 2.0000 | Freq: Once | CUTANEOUS | Status: DC

## 2024-01-02 MED ORDER — PROPOFOL 1000 MG/100ML IV EMUL
INTRAVENOUS | Status: AC
  Filled 2024-01-02: qty 100

## 2024-01-02 MED ORDER — DEXAMETHASONE SODIUM PHOSPHATE 10 MG/ML IJ SOLN
8.0000 mg | Freq: Once | INTRAMUSCULAR | Status: AC
  Administered 2024-01-02: 10 mg via INTRAVENOUS

## 2024-01-02 MED ORDER — SENNA 8.6 MG PO TABS
2.0000 | ORAL_TABLET | Freq: Every day | ORAL | Status: DC
  Administered 2024-01-02: 17.2 mg via ORAL
  Filled 2024-01-02: qty 2

## 2024-01-02 MED ORDER — DEXAMETHASONE SODIUM PHOSPHATE 10 MG/ML IJ SOLN
10.0000 mg | Freq: Once | INTRAMUSCULAR | Status: AC
  Administered 2024-01-03: 10 mg via INTRAVENOUS
  Filled 2024-01-02: qty 1

## 2024-01-02 MED ORDER — 0.9 % SODIUM CHLORIDE (POUR BTL) OPTIME
TOPICAL | Status: DC | PRN
  Administered 2024-01-02: 1000 mL

## 2024-01-02 MED ORDER — OXYCODONE HCL 5 MG PO TABS
5.0000 mg | ORAL_TABLET | ORAL | Status: DC | PRN
  Administered 2024-01-02 – 2024-01-03 (×3): 10 mg via ORAL
  Filled 2024-01-02 (×4): qty 2

## 2024-01-02 MED ORDER — CELECOXIB 100 MG PO CAPS
100.0000 mg | ORAL_CAPSULE | Freq: Two times a day (BID) | ORAL | Status: DC | PRN
  Administered 2024-01-03: 100 mg via ORAL
  Filled 2024-01-02: qty 1

## 2024-01-02 MED ORDER — CHLORHEXIDINE GLUCONATE 0.12 % MT SOLN
15.0000 mL | Freq: Once | OROMUCOSAL | Status: AC
  Administered 2024-01-02: 15 mL via OROMUCOSAL

## 2024-01-02 MED ORDER — POLYETHYLENE GLYCOL 3350 17 G PO PACK: 17.0000 g | PACK | Freq: Two times a day (BID) | ORAL | Status: DC

## 2024-01-02 MED ORDER — ONDANSETRON HCL 4 MG/2ML IJ SOLN: 4.0000 mg | Freq: Four times a day (QID) | INTRAMUSCULAR | Status: DC | PRN

## 2024-01-02 MED ORDER — ASPIRIN 81 MG PO CHEW
81.0000 mg | CHEWABLE_TABLET | Freq: Two times a day (BID) | ORAL | Status: DC
Start: 2024-01-02 — End: 2024-01-03
  Administered 2024-01-02 – 2024-01-03 (×2): 81 mg via ORAL
  Filled 2024-01-02 (×2): qty 1

## 2024-01-02 MED ORDER — DEXAMETHASONE SODIUM PHOSPHATE 10 MG/ML IJ SOLN
INTRAMUSCULAR | Status: AC
  Filled 2024-01-02: qty 1

## 2024-01-02 MED ORDER — ORAL CARE MOUTH RINSE: 15.0000 mL | Freq: Once | OROMUCOSAL | Status: AC

## 2024-01-02 MED ORDER — PHENYLEPHRINE HCL-NACL 20-0.9 MG/250ML-% IV SOLN
INTRAVENOUS | Status: DC | PRN
  Administered 2024-01-02: 20 ug/min via INTRAVENOUS

## 2024-01-02 MED ORDER — FENTANYL CITRATE PF 50 MCG/ML IJ SOSY: 25.0000 ug | PREFILLED_SYRINGE | INTRAMUSCULAR | Status: DC | PRN

## 2024-01-02 MED ORDER — EZETIMIBE 10 MG PO TABS
10.0000 mg | ORAL_TABLET | Freq: Every day | ORAL | Status: DC
  Administered 2024-01-02 – 2024-01-03 (×2): 10 mg via ORAL
  Filled 2024-01-02 (×2): qty 1

## 2024-01-02 SURGICAL SUPPLY — 44 items
ATTUNE MED ANAT PAT 38 KNEE (Knees) IMPLANT
BAG COUNTER SPONGE SURGICOUNT (BAG) IMPLANT
BAG ZIPLOCK 12X15 (MISCELLANEOUS) ×1 IMPLANT
BASEPLATE TIB CMT FB PCKT SZ6 (Knees) IMPLANT
BLADE SAW SGTL 13.0X1.19X90.0M (BLADE) ×1 IMPLANT
BNDG ELASTIC 6INX 5YD STR LF (GAUZE/BANDAGES/DRESSINGS) ×1 IMPLANT
BOWL SMART MIX CTS (DISPOSABLE) ×1 IMPLANT
CEMENT HV SMART SET (Cement) ×2 IMPLANT
COMPONENT FEM CMT ATTUN LT SZ6 (Femur) IMPLANT
COVER SURGICAL LIGHT HANDLE (MISCELLANEOUS) ×1 IMPLANT
CUFF TRNQT CYL 34X4.125X (TOURNIQUET CUFF) ×1 IMPLANT
DERMABOND ADVANCED .7 DNX12 (GAUZE/BANDAGES/DRESSINGS) ×1 IMPLANT
DRAPE U-SHAPE 47X51 STRL (DRAPES) ×1 IMPLANT
DRESSING AQUACEL AG SP 3.5X10 (GAUZE/BANDAGES/DRESSINGS) ×1 IMPLANT
DURAPREP 26ML APPLICATOR (WOUND CARE) ×2 IMPLANT
ELECT REM PT RETURN 15FT ADLT (MISCELLANEOUS) ×1 IMPLANT
GLOVE BIO SURGEON STRL SZ 6 (GLOVE) ×1 IMPLANT
GLOVE BIOGEL PI IND STRL 6.5 (GLOVE) ×1 IMPLANT
GLOVE BIOGEL PI IND STRL 7.5 (GLOVE) ×1 IMPLANT
GLOVE ORTHO TXT STRL SZ7.5 (GLOVE) ×2 IMPLANT
GOWN STRL REUS W/ TWL LRG LVL3 (GOWN DISPOSABLE) ×2 IMPLANT
HOLDER FOLEY CATH W/STRAP (MISCELLANEOUS) IMPLANT
INSERT MED ATTUNE KNEE 6 8 LT (Insert) IMPLANT
KIT TURNOVER KIT A (KITS) ×1 IMPLANT
MANIFOLD NEPTUNE II (INSTRUMENTS) ×1 IMPLANT
NDL SAFETY ECLIPSE 18X1.5 (NEEDLE) IMPLANT
NS IRRIG 1000ML POUR BTL (IV SOLUTION) ×1 IMPLANT
PACK TOTAL KNEE CUSTOM (KITS) ×1 IMPLANT
PENCIL SMOKE EVACUATOR (MISCELLANEOUS) ×1 IMPLANT
PIN FIX SIGMA LCS THRD HI (PIN) IMPLANT
PROTECTOR NERVE ULNAR (MISCELLANEOUS) ×1 IMPLANT
SET HNDPC FAN SPRY TIP SCT (DISPOSABLE) ×1 IMPLANT
SET PAD KNEE POSITIONER (MISCELLANEOUS) ×1 IMPLANT
SPIKE FLUID TRANSFER (MISCELLANEOUS) ×2 IMPLANT
SUT MNCRL AB 4-0 PS2 18 (SUTURE) ×1 IMPLANT
SUT STRATAFIX PDS+ 0 24IN (SUTURE) ×1 IMPLANT
SUT VIC AB 1 CT1 36 (SUTURE) ×1 IMPLANT
SUT VIC AB 2-0 CT1 TAPERPNT 27 (SUTURE) ×2 IMPLANT
SYR 3ML LL SCALE MARK (SYRINGE) ×1 IMPLANT
TOWEL GREEN STERILE FF (TOWEL DISPOSABLE) ×1 IMPLANT
TRAY FOLEY MTR SLVR 16FR STAT (SET/KITS/TRAYS/PACK) ×1 IMPLANT
TUBE SUCTION HIGH CAP CLEAR NV (SUCTIONS) ×1 IMPLANT
WATER STERILE IRR 1000ML POUR (IV SOLUTION) ×2 IMPLANT
WRAP KNEE MAXI GEL POST OP (GAUZE/BANDAGES/DRESSINGS) ×1 IMPLANT

## 2024-01-02 NOTE — Anesthesia Postprocedure Evaluation (Signed)
 Anesthesia Post Note  Patient: William Savage  Procedure(s) Performed: ARTHROPLASTY, KNEE, TOTAL (Left: Knee)     Patient location during evaluation: PACU Anesthesia Type: Spinal Level of consciousness: awake, awake and alert and oriented Pain management: pain level controlled Vital Signs Assessment: post-procedure vital signs reviewed and stable Respiratory status: spontaneous breathing, nonlabored ventilation and respiratory function stable Cardiovascular status: blood pressure returned to baseline and stable Postop Assessment: no headache, no backache, spinal receding and no apparent nausea or vomiting Anesthetic complications: no   No notable events documented.  Last Vitals:  Vitals:   01/02/24 1257 01/02/24 1530  BP: 126/70 117/64  Pulse:  (!) 56  Resp: 18 18  Temp: 37.1 C   SpO2:  99%    Last Pain:  Vitals:   01/02/24 1339  TempSrc:   PainSc: 2                  Garnette FORBES Skill[place]
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
## 2024-01-02 NOTE — Op Note (Signed)
 NAME:  William Savage                      MEDICAL RECORD NO.:  [id number]                             FACILITY:  [facility]      PHYSICIAN:  Donnice BIRCH. Ernie, M.D.  DATE OF BIRTH:  12/01/1960      DATE OF PROCEDURE:  01/02/2024                                     OPERATIVE REPORT         PREOPERATIVE DIAGNOSIS:  Left knee osteoarthritis.      POSTOPERATIVE DIAGNOSIS:  Left knee osteoarthritis.      FINDINGS:  The patient was noted to have complete loss of cartilage and   bone-on-bone arthritis with associated osteophytes in the medial and patellofemoral compartments of   the knee with a significant synovitis and associated effusion.  The patient had failed months of conservative treatment including medications, injection therapy, activity modification.     PROCEDURE:  Left total knee replacement.      COMPONENTS USED:  DePuy Attune FB CR MS knee   system, a size 6 femur, 6 tibia, size 8 mm CR MS AOX insert, and 38 anatomic patellar   button.      SURGEON:  Donnice BIRCH. Ernie, M.D.      ASSISTANT:  Rosina Calin, PA-C.      ANESTHESIA:  Regional and Spinal.      SPECIMENS:  None.      COMPLICATION:  None.      DRAINS:  None.  EBL: <200 cc      TOURNIQUET TIME:  35 min at 225 mmHg     The patient was stable to the recovery room.      INDICATION FOR PROCEDURE:  William Savage is a 63 y.o. male patient of   mine.  The patient had been seen, evaluated, and treated for months conservatively in the   office with medication, activity modification, and injections.  The patient had   radiographic changes of bone-on-bone arthritis with endplate sclerosis and osteophytes noted.  Based on the radiographic changes and failed conservative measures, the patient   decided to proceed with definitive treatment, total knee replacement.  Risks of infection, DVT, component failure, need for revision surgery, neurovascular injury were reviewed in the office setting.  The postop course was reviewed  stressing the efforts to maximize post-operative satisfaction and function.  Consent was obtained for benefit of pain   relief.      PROCEDURE IN DETAIL:  The patient was brought to the operative theater.   Once adequate anesthesia, preoperative antibiotics, 2 gm of Ancef ,1 gm of Tranexamic Acid , and 10 mg of Decadron  administered, the patient was positioned supine with a left thigh tourniquet placed.  The  left lower extremity was prepped and draped in sterile fashion.  A time-   out was performed identifying the patient, planned procedure, and the appropriate extremity.      The left lower extremity was placed in the [facility] leg holder.  The leg was   exsanguinated, tourniquet elevated to 225 mmHg.  A midline incision was   made followed by median parapatellar arthrotomy.  Following initial   exposure, attention was first directed  to the patella.  Precut   measurement was noted to be 26 mm.  I resected down to 14 mm and used a   38 anatomic patellar button to restore patellar height as well as cover the cut surface.      The lug holes were drilled and a metal shim was placed to protect the   patella from retractors and saw blade during the procedure.      At this point, attention was now directed to the femur.  The femoral   canal was opened with a drill, irrigated to try to prevent fat emboli.  An   intramedullary rod was passed at 5 degrees valgus, 9 mm of bone was   resected off the distal femur.  Following this resection, the tibia was   subluxated anteriorly.  Using the extramedullary guide, 2 mm of bone was resected off   the proximal medial tibia.  We confirmed the gap would be   stable medially and laterally with a size 6 spacer block as well as confirmed that the tibial cut was perpendicular in the coronal plane, checking with an alignment rod.      Once this was done, I sized the femur to be a size 6 in the anterior-   posterior dimension, chose a standard component based on  medial and   lateral dimension.  The size 6 rotation block was then pinned in   position anterior referenced using the C-clamp to set rotation.  The   anterior, posterior, and  chamfer cuts were made without difficulty nor   notching making certain that I was along the anterior cortex to help   with flexion gap stability.      The final box cut was made off the lateral aspect of distal femur.      At this point, the tibia was sized to be a size 6.  The size 6 tray was   then pinned in position through the medial third of the tubercle,   drilled, and keel punched.  Trial reduction was now carried with a 6 femur,  6 tibia, a size 8 mm CR MS insert, and the 38 anatomic patella botton.  The knee was brought to full extension with good flexion stability with the patella   tracking through the trochlea without application of pressure.  Given   all these findings the trial components removed.  Final components were   opened and cement was mixed.  The knee was irrigated with normal saline solution and pulse lavage.  The synovial lining was   then injected with 30 cc of 0.25% Marcaine  with epinephrine , 1 cc of Toradol  and 30 cc of NS for a total of 61 cc.     Final implants were then cemented onto cleaned and dried cut surfaces of bone with the knee brought to extension with a size 8 mm CR MS trial insert.      Once the cement had fully cured, excess cement was removed   throughout the knee.  I confirmed that I was satisfied with the range of   motion and stability, and the final size 8 mm CR MS AOX insert was chosen.  It was   placed into the knee.      The tourniquet had been let down at 32 minutes.  No significant   hemostasis was required.  The extensor mechanism was then reapproximated using #1 Vicryl and #1 Stratafix sutures with the knee   in flexion.  The  remaining wound was closed with 2-0 Vicryl and running 4-0 Monocryl.   The knee was cleaned, dried, dressed sterilely using Dermabond  and   Aquacel dressing.  The patient was then   brought to recovery room in stable condition, tolerating the procedure   well.   Please note that Physician Assistant, Rosina Calin, PA-C was present for the entirety of the case, and was utilized for pre-operative positioning, peri-operative retractor management, general facilitation of the procedure and for primary wound closure at the end of the case.              Donnice CORDOBA Ernie, M.D.    01/02/2024 8:37 AM

## 2024-01-02 NOTE — Anesthesia Procedure Notes (Signed)
 Procedure Name: MAC Date/Time: 01/02/2024 10:00 AM  Performed by: Nada Corean CROME, CRNAPre-anesthesia Checklist: Patient identified, Emergency Drugs available, Suction available, Patient being monitored and Timeout performed Patient Re-evaluated:Patient Re-evaluated prior to induction Oxygen Delivery Method: Simple face mask Preoxygenation: Pre-oxygenation with 100% oxygen Induction Type: IV induction Placement Confirmation: positive ETCO2 Dental Injury: Teeth and Oropharynx as per pre-operative assessment

## 2024-01-02 NOTE — Discharge Instructions (Signed)

## 2024-01-02 NOTE — Transfer of Care (Signed)
 Immediate Anesthesia Transfer of Care Note  Patient: William Savage  Procedure(s) Performed: ARTHROPLASTY, KNEE, TOTAL (Left: Knee)  Patient Location: PACU  Anesthesia Type:Spinal  Level of Consciousness: awake and alert   Airway & Oxygen Therapy: Patient Spontanous Breathing and Patient connected to face mask oxygen  Post-op Assessment: Report given to RN  Post vital signs: Reviewed and stable  Last Vitals:  Vitals Value Taken Time  BP 125/70 01/02/24 11:35  Temp    Pulse 72 01/02/24 11:38  Resp 14 01/02/24 11:38  SpO2 100 % 01/02/24 11:38  Vitals shown include unfiled device data.  Last Pain:  Vitals:   01/02/24 0923  TempSrc:   PainSc: 0-No pain         Complications: No notable events documented.

## 2024-01-02 NOTE — Progress Notes (Signed)
   01/02/24 2328  BiPAP/CPAP/SIPAP  $ Non-Invasive Home Ventilator  Initial  BiPAP/CPAP/SIPAP Pt Type Adult  BiPAP/CPAP/SIPAP Resmed  Mask Type Nasal pillows  Dentures removed? Not applicable  Mask Size Medium  Patient Home Machine No  Patient Home Mask Yes  Patient Home Tubing Yes  Auto Titrate Yes  CPAP/SIPAP surface wiped down Yes  Device Plugged into RED Power Outlet Yes  BiPAP/CPAP /SiPAP Vitals  Pulse Rate 72  Bilateral Breath Sounds Diminished  MEWS Score/Color  MEWS Score 0  MEWS Score Color Landy

## 2024-01-02 NOTE — H&P (Signed)
 TOTAL KNEE ADMISSION H&P  Patient is being admitted for left total knee arthroplasty.  Therapy Plans: outpatient therapy at [facility] Disposition: Home with wife Planned DVT Prophylaxis: aspirin  81mg  BID DME needed: walker & 3-n-1 PCP: Dr. Volanda - saw recently - will drop off form Cardio: Dr. Ladona - TXA: IV Allergies: mushroom Anesthesia Concerns: none BMI: 30.8 Last HgbA1c: Not diabetic   Other: - staying overnight - ice machine at hospital - oxycodone , robaxin , tylenol , celebrex  - reports stomach irritation with NSAIDs  Subjective:  Chief Complaint:left knee pain.  HPI: William Savage, 63 y.o. male, has a history of pain and functional disability in the left knee due to arthritis and has failed non-surgical conservative treatments for greater than 12 weeks to includeNSAID's and/or analgesics, corticosteriod injections, and activity modification.  Onset of symptoms was gradual, starting 2 years ago with gradually worsening course since that time. The patient noted no past surgery on the left knee(s).  Patient currently rates pain in the left knee(s) at 8 out of 10 with activity. Patient has worsening of pain with activity and weight bearing and pain that interferes with activities of daily living.  Patient has evidence of joint space narrowing by imaging studies. There is no active infection.  Patient Active Problem List   Diagnosis Date Noted   Effusion, left knee 03/27/2023   Idiopathic gout of multiple sites 03/27/2023   Radiculitis 03/27/2023   Chronic low back pain 03/27/2023   Osteoarthritis of left knee 03/27/2023   Bilateral plantar fasciitis 03/27/2023   Past Medical History:  Diagnosis Date   Ankle instability    bilateral   Chronic kidney disease    Diabetes (HCC)    Fatty liver    Fatty liver    Hyperlipidemia    Hypertension    IBS (irritable bowel syndrome)    Left shoulder strain    Migraines    Nonobstructive atherosclerosis of coronary artery     Plantar fasciitis    PTSD (post-traumatic stress disorder)    Radiculopathy    Sleep apnea    TIA (transient ischemic attack)     Past Surgical History:  Procedure Laterality Date   BUNIONECTOMY Right    COLONOSCOPY  2021    No current facility-administered medications for this encounter.   Current Outpatient Medications  Medication Sig Dispense Refill Last Dose/Taking   acetaminophen  (TYLENOL ) 500 MG tablet Take 500-1,000 tablets by mouth every 6 (six) hours as needed for moderate pain (pain score 4-6).   Taking As Needed   amLODipine  (NORVASC ) 10 MG tablet Take by mouth.   Taking   Ascorbic Acid (VITAMIN C PO) Take 1 tablet by mouth daily.   Taking   aspirin  81 MG chewable tablet Chew 81 mg by mouth daily.   Taking   celecoxib  (CELEBREX ) 100 MG capsule Take 100 mg by mouth 2 (two) times daily as needed for moderate pain (pain score 4-6).   Taking As Needed   Cholecalciferol 100 MCG (4000 UT) TABS Take 8,000 Units by mouth daily.   Taking   colchicine 0.6 MG tablet Take 0.6 mg by mouth daily as needed (Gout flares).   Taking As Needed   diclofenac Sodium (VOLTAREN) 1 % GEL Apply 4 g topically 4 (four) times daily as needed (Pain).   Taking As Needed   dicyclomine  (BENTYL ) 10 MG capsule Take 10 mg by mouth 4 (four) times daily as needed for spasms.   Taking As Needed   Docusate Sodium (DSS) 100 MG  CAPS Take 100 mg by mouth 2 (two) times daily as needed (Constipation).   Taking As Needed   DULoxetine  (CYMBALTA ) 30 MG capsule Take 90 mg by mouth daily.   Taking   ezetimibe  (ZETIA ) 10 MG tablet Take 10 mg by mouth daily.   Taking   GARLIC OIL PO Take 1 Dose by mouth daily.   Taking   Ginger, Zingiber officinalis, (GINGER PO) Take 1 Dose by mouth daily.   Taking   HYDROcodone-acetaminophen  (NORCO/VICODIN) 5-325 MG tablet Take 1 tablet by mouth every 4 (four) hours as needed for moderate pain (pain score 4-6).   Taking As Needed   losartan  (COZAAR ) 100 MG tablet Take 100 mg by mouth daily.    Taking   melatonin 3 MG TABS tablet Take 3 mg by mouth at bedtime as needed (Sleep).   Taking As Needed   methocarbamol  (ROBAXIN ) 500 MG tablet Take 500 mg by mouth 3 (three) times daily as needed for muscle spasms.   Taking As Needed   methylPREDNISolone (MEDROL DOSEPAK) 4 MG TBPK tablet Take 4 mg by mouth See admin instructions. Take round of dose pack as needed.   Taking   Multiple Vitamin (MULTIVITAMINS PO) Take 1 tablet by mouth daily.   Taking   nitroGLYCERIN  (NITROSTAT ) 0.4 MG SL tablet Place 1 tablet (0.4 mg total) under the tongue every 5 (five) minutes as needed for up to 25 days for chest pain. 25 tablet 3 Taking As Needed   polycarbophil (FIBERCON) 625 MG tablet Take 1,250 mg by mouth daily.   Taking   Psyllium (METAMUCIL) 48.57 % POWD Take 1 Dose by mouth daily.   Taking   rizatriptan (MAXALT) 10 MG tablet Take 10 mg by mouth as needed for migraine.   Taking As Needed   rosuvastatin  (CRESTOR ) 20 MG tablet Take 20 mg by mouth daily.   Taking   tadalafil (CIALIS) 10 MG tablet Take 10 mg by mouth daily as needed for erectile dysfunction.   Taking As Needed   topiramate  (TOPAMAX ) 25 MG tablet Take 25 mg by mouth daily as needed (Migraines).   Taking As Needed   Allergies  Allergen Reactions   Mushroom Extract Complex (Obsolete) Other (See Comments)    Gout flares, joint pain   Other Other (See Comments)    Allergic to purines    Shellfish Allergy Other (See Comments)    Social History   Tobacco Use   Smoking status: Never    Passive exposure: Never   Smokeless tobacco: Never  Substance Use Topics   Alcohol  use: Not Currently    Family History  Problem Relation Age of Onset   Diabetes Mother    Stroke Mother    Hypertension Mother    Unexplained death Father    Hypertension Sister    Diabetes Sister    Hypertension Sister    Diabetes Sister      Review of Systems  Constitutional:  Negative for chills and fever.  Respiratory:  Negative for cough and shortness of  breath.   Cardiovascular:  Negative for chest pain.  Gastrointestinal:  Negative for nausea and vomiting.  Musculoskeletal:  Positive for arthralgias.     Objective:  Physical Exam Well nourished and well developed. General: Alert and oriented x3, cooperative and pleasant, no acute distress.  Musculoskeletal: Left knee exam: No palpable effusion, warmth erythema Slight flexion contracture with flexion to 110 degrees with pain over the medial and anterior aspect knee to palpation Stable medial and lateral  collateral ligaments He is neurovascular intact distally  Vital signs in last 24 hours:    Labs:   Estimated body mass index is 29.84 kg/m as calculated from the following:   Height as of 12/25/23: 6' (1.829 m).   Weight as of 12/25/23: 99.8 kg.   Imaging Review Plain radiographs demonstrate severe degenerative joint disease of the left knee(s). The overall alignment isneutral. The bone quality appears to be adequate for age and reported activity level.      Assessment/Plan:  End stage arthritis, left knee   The patient history, physical examination, clinical judgment of the provider and imaging studies are consistent with end stage degenerative joint disease of the left knee(s) and total knee arthroplasty is deemed medically necessary. The treatment options including medical management, injection therapy arthroscopy and arthroplasty were discussed at length. The risks and benefits of total knee arthroplasty were presented and reviewed. The risks due to aseptic loosening, infection, stiffness, patella tracking problems, thromboembolic complications and other imponderables were discussed. The patient acknowledged the explanation, agreed to proceed with the plan and consent was signed. Patient is being admitted for inpatient treatment for surgery, pain control, PT, OT, prophylactic antibiotics, VTE prophylaxis, progressive ambulation and ADL's and discharge planning. The patient  is planning to be discharged home.     Patient's anticipated LOS is less than 2 midnights, meeting these requirements: - Younger than 71 - Lives within 1 hour of care - Has a competent adult at home to recover with post-op recover - NO history of  - Chronic pain requiring opiods  - Diabetes  - Coronary Artery Disease  - Heart failure  - Heart attack  - Stroke  - DVT/VTE  - Cardiac arrhythmia  - Respiratory Failure/COPD  - Renal failure  - Anemia  - Advanced Liver disease  Rosina Calin, PA-C Orthopedic Surgery [facility] [place] [phone]

## 2024-01-02 NOTE — Anesthesia Procedure Notes (Signed)
 Spinal  Patient location during procedure: OR Start time: 01/02/2024 9:55 AM End time: 01/02/2024 10:00 AM Reason for block: surgical anesthesia Staffing Performed: anesthesiologist  Anesthesiologist: Corinne Garnette BRAVO, MD Performed by: Corinne Garnette BRAVO, MD Authorized by: Corinne Garnette BRAVO, MD   Preanesthetic Checklist Completed: patient identified, IV checked, risks and benefits discussed, surgical consent, monitors and equipment checked, pre-op evaluation and timeout performed Spinal Block Patient position: sitting Prep: ChloraPrep and site prepped and draped Patient monitoring: continuous pulse ox and blood pressure Approach: midline Location: L3-4 Injection technique: single-shot Needle Needle type: Pencan  Needle gauge: 24 G Assessment Events: CSF return Additional Notes Functioning IV was confirmed and monitors were applied. Sterile prep and drape, including hand hygiene, mask and sterile gloves were used. The patient was positioned and the spine was prepped. The skin was anesthetized with lidocaine .  Free flow of clear CSF was obtained prior to injecting local anesthetic into the CSF.  The spinal needle aspirated freely following injection.  The needle was carefully withdrawn.  The patient tolerated the procedure well. Consent was obtained prior to procedure with all questions answered and concerns addressed. Risks including but not limited to bleeding, infection, nerve damage, paralysis, failed block, inadequate analgesia, allergic reaction, high spinal, itching and headache were discussed and the patient wished to proceed.   Garnette Corinne, MD

## 2024-01-02 NOTE — Anesthesia Procedure Notes (Signed)
 Anesthesia Regional Block: Adductor canal block   Pre-Anesthetic Checklist: , timeout performed,  Correct Patient, Correct Site, Correct Laterality,  Correct Procedure, Correct Position, site marked,  Risks and benefits discussed,  Surgical consent,  Pre-op evaluation,  At surgeon's request and post-op pain management  Laterality: Left  Prep: chloraprep       Needles:  Injection technique: Single-shot  Needle Type: Echogenic Needle     Needle Length: 9cm  Needle Gauge: 21     Additional Needles:   Procedures:,,,, ultrasound used (permanent image in chart),,    Narrative:  Start time: 01/02/2024 8:55 AM End time: 01/02/2024 9:01 AM Injection made incrementally with aspirations every 5 mL.  Performed by: Personally  Anesthesiologist: Corinne Garnette BRAVO, MD  Additional Notes: No pain on injection. No increased resistance to injection. Injection made in 5cc increments.  Good needle visualization.  Patient tolerated procedure well.

## 2024-01-02 NOTE — Interval H&P Note (Signed)
 History and Physical Interval Note:  01/02/2024 8:37 AM  William Savage  has presented today for surgery, with the diagnosis of Left Knee Osteoarthritis.  The various methods of treatment have been discussed with the patient and family. After consideration of risks, benefits and other options for treatment, the patient has consented to  Procedure(s): ARTHROPLASTY, KNEE, TOTAL (Left) as a surgical intervention.  The patient's history has been reviewed, patient examined, no change in status, stable for surgery.  I have reviewed the patient's chart and labs.  Questions were answered to the patient's satisfaction.     [name]

## 2024-01-03 ENCOUNTER — Other Ambulatory Visit (HOSPITAL_COMMUNITY): Payer: Self-pay

## 2024-01-03 DIAGNOSIS — M1712 Unilateral primary osteoarthritis, left knee: Secondary | ICD-10-CM | POA: Diagnosis not present

## 2024-01-03 LAB — CBC
HCT: 39.6 % (ref 39.0–52.0)
Hemoglobin: 12.3 g/dL — ABNORMAL LOW (ref 13.0–17.0)
MCH: 25.8 pg — ABNORMAL LOW (ref 26.0–34.0)
MCHC: 31.1 g/dL (ref 30.0–36.0)
MCV: 83.2 fL (ref 80.0–100.0)
Platelets: 214 K/uL (ref 150–400)
RBC: 4.76 MIL/uL (ref 4.22–5.81)
RDW: 15.9 % — ABNORMAL HIGH (ref 11.5–15.5)
WBC: 13.3 K/uL — ABNORMAL HIGH (ref 4.0–10.5)
nRBC: 0 % (ref 0.0–0.2)

## 2024-01-03 LAB — BASIC METABOLIC PANEL WITH GFR
Anion gap: 6 (ref 5–15)
BUN: 16 mg/dL (ref 6–20)
CO2: 23 mmol/L (ref 22–32)
Calcium: 9 mg/dL (ref 8.9–10.3)
Chloride: 104 mmol/L (ref 98–111)
Creatinine, Ser: 1.04 mg/dL (ref 0.61–1.24)
GFR, Estimated: >60 mL/min (ref >60)
Glucose, Bld: 147 mg/dL — ABNORMAL HIGH (ref 70–99)
Potassium: 4.6 mmol/L (ref 3.5–5.1)
Sodium: 133 mmol/L — ABNORMAL LOW (ref 135–145)

## 2024-01-03 MED ORDER — SENNA 8.6 MG PO TABS: 2.0000 | ORAL_TABLET | Freq: Every day | ORAL | 0 refills | Status: AC

## 2024-01-03 MED ORDER — METHOCARBAMOL 500 MG PO TABS: 500.0000 mg | ORAL_TABLET | Freq: Four times a day (QID) | ORAL | 2 refills | Status: AC | PRN

## 2024-01-03 MED ORDER — POLYETHYLENE GLYCOL 3350 17 G PO PACK: 17.0000 g | PACK | Freq: Two times a day (BID) | ORAL | 0 refills | Status: AC

## 2024-01-03 MED ORDER — ASPIRIN 81 MG PO CHEW: 81.0000 mg | CHEWABLE_TABLET | Freq: Two times a day (BID) | ORAL | 0 refills | Status: AC

## 2024-01-03 MED ORDER — OXYCODONE HCL 5 MG PO TABS: 5.0000 mg | ORAL_TABLET | ORAL | 0 refills | Status: DC | PRN

## 2024-01-03 MED ORDER — OXYCODONE HCL 5 MG PO TABS
5.0000 mg | ORAL_TABLET | ORAL | 0 refills | Status: AC | PRN
  Filled 2024-01-03: qty 42, 7d supply, fill #0

## 2024-01-03 MED ORDER — ACETAMINOPHEN 500 MG PO TABS: 1000.0000 mg | ORAL_TABLET | Freq: Four times a day (QID) | ORAL | Status: AC

## 2024-01-03 NOTE — Progress Notes (Signed)
 Subjective: 1 Day Post-Op Procedure(s) (LRB): ARTHROPLASTY, KNEE, TOTAL (Left) Patient reports pain as mild.   Patient seen in rounds for Dr. Ernie. Patient is well, and has had no acute complaints or problems. No acute events overnight. Foley catheter removed. Patient ambulated 55 feet with PT.  We will start therapy today.   Objective: Vital signs in last 24 hours: Temp:  [97.3 F (36.3 C)-98.7 F (37.1 C)] 97.5 F (36.4 C) (07/16 0636) Pulse Rate:  [51-72] 60 (07/16 0636) Resp:  [12-19] 16 (07/16 0636) BP: (115-149)/(63-87) 127/80 (07/16 0636) SpO2:  [96 %-100 %] 99 % (07/16 0636)  Intake/Output from previous day:  Intake/Output Summary (Last 24 hours) at 01/03/2024 0858 Last data filed at 01/03/2024 0700 Gross per 24 hour  Intake 2750 ml  Output 2500 ml  Net 250 ml     Intake/Output this shift: No intake/output data recorded.  Labs: Recent Labs    01/03/24 0340  HGB 12.3*   Recent Labs    01/03/24 0340  WBC 13.3*  RBC 4.76  HCT 39.6  PLT 214   Recent Labs    01/03/24 0340  NA 133*  K 4.6  CL 104  CO2 23  BUN 16  CREATININE 1.04  GLUCOSE 147*  CALCIUM  9.0   No results for input(s): LABPT, INR in the last 72 hours.  Exam: General - Patient is Alert and Oriented Extremity - Neurologically intact Sensation intact distally Intact pulses distally Dorsiflexion/Plantar flexion intact Dressing - dressing C/D/I Motor Function - intact, moving foot and toes well on exam.   Past Medical History:  Diagnosis Date   Ankle instability    bilateral   Chronic kidney disease    Diabetes (HCC)    Fatty liver    Fatty liver    Hyperlipidemia    Hypertension    IBS (irritable bowel syndrome)    Left shoulder strain    Migraines    Nonobstructive atherosclerosis of coronary artery    Plantar fasciitis    PTSD (post-traumatic stress disorder)    Radiculopathy    Sleep apnea    TIA (transient ischemic attack)     Assessment/Plan: 1 Day  Post-Op Procedure(s) (LRB): ARTHROPLASTY, KNEE, TOTAL (Left) Principal Problem:   S/P total knee replacement, left Active Problems:   S/P total knee arthroplasty, left  Estimated body mass index is 29.84 kg/m as calculated from the following:   Height as of this encounter: 6' (1.829 m).   Weight as of this encounter: 99.8 kg. Advance diet Up with therapy D/C IV fluids   Patient's anticipated LOS is less than 2 midnights, meeting these requirements: - Younger than 92 - Lives within 1 hour of care - Has a competent adult at home to recover with post-op recover - NO history of  - Chronic pain requiring opiods  - Diabetes  - Coronary Artery Disease  - Heart failure  - Heart attack  - Stroke  - DVT/VTE  - Cardiac arrhythmia  - Respiratory Failure/COPD  - Renal failure  - Anemia  - Advanced Liver disease     DVT Prophylaxis - Aspirin  Weight bearing as tolerated.  Hgb stable at 12.3 this AM Cr. Stable at 1.04  Plan is to go Home after hospital stay. Plan for discharge today following 1-2 sessions of PT as long as they are meeting their goals. Patient is scheduled for OPPT. Follow up in the office in 2 weeks.   Rosina Calin, PA-C Orthopedic Surgery [phone] 01/03/2024, 8:58  AM

## 2024-01-03 NOTE — Progress Notes (Signed)
 Physical Therapy Treatment Patient Details Name: William Savage MRN: [id number] DOB: [birth year] Today's Date: 01/03/2024   History of Present Illness 63 yo male presents to therapy s/p L TKA on 01/02/2024 due to failure of conservative measures. Pt PMH includes but is not limited to: gout, radiculitis, chronic LBP, OA, plantar fasciitis, B ankle instability, DM II, HLD, HTN, IBS, OSA, and TIA.    PT Comments  Pt is POD # 1 and is progressing well.  Pt with excellent pain control, ROM, quad activation, understanding of HEP and safety.  He ambulated 100' with RW and supervision and performed stairs similar to home set up.  Pt has RW delivered, support at home, and outpt PT later this week. Pt demonstrates safe gait & transfers in order to return home from PT perspective once discharged by MD.  While in hospital, will continue to benefit from PT for skilled therapy to advance mobility and exercises.       If plan is discharge home, recommend the following: A little help with walking and/or transfers;A little help with bathing/dressing/bathroom;Assistance with cooking/housework;Help with stairs or ramp for entrance;Assist for transportation   Can travel by private vehicle        Equipment Recommendations  Rolling walker (2 wheels);BSC/3in1    Recommendations for Other Services       Precautions / Restrictions Precautions Precautions: Fall;Knee Restrictions LLE Weight Bearing Per Provider Order: Weight bearing as tolerated     Mobility  Bed Mobility               General bed mobility comments: in chair    Transfers Overall transfer level: Needs assistance Equipment used: Rolling walker (2 wheels) Transfers: Sit to/from Stand Sit to Stand: Supervision                Ambulation/Gait Ambulation/Gait assistance: Contact guard assist, Supervision Gait Distance (Feet): 100 Feet Assistive device: Rolling walker (2 wheels) Gait Pattern/deviations: Step-through pattern,  Decreased weight shift to left Gait velocity: decreased but functional     General Gait Details: Started with CGA progressed to supervision; cues for pushing RW   Stairs Stairs: Yes Stairs assistance: Contact guard assist Stair Management: Two rails, Step to pattern, Forwards, One rail Left Number of Stairs: 4 General stair comments: started with 2 rails and progressed to rail on L and grab bar simulated by grabbing stair post; tolerated well, educated on sequencing   Wheelchair Mobility     Tilt Bed    Modified Rankin (Stroke Patients Only)       Balance Overall balance assessment: Needs assistance Sitting-balance support: Feet supported Sitting balance-Leahy Scale: Good     Standing balance support: Bilateral upper extremity supported, No upper extremity supported Standing balance-Leahy Scale: Fair Standing balance comment: RW to ambulate but could perform ADLs in standing without support                            Communication Communication Communication: No apparent difficulties  Cognition Arousal: Alert Behavior During Therapy: WFL for tasks assessed/performed   PT - Cognitive impairments: No apparent impairments                                Cueing    Exercises Total Joint Exercises Ankle Circles/Pumps: AROM, Both, 10 reps, Supine Quad Sets: AROM, Both, 10 reps, Supine Heel Slides: AAROM, Left, 10 reps, Supine Hip ABduction/ADduction: AAROM, Left,  10 reps, Supine Long Arc Quad: AROM, Left, 10 reps, Seated Knee Flexion: AROM, Seated, Left, 10 reps Goniometric ROM: L knee 0 to 85 degrees Other Exercises Other Exercises: educated on AAROM with gait belt as needed    General Comments General comments (skin integrity, edema, etc.): VSS, tolerated well Educated on safe ice use, no pivots, car transfers, resting with leg straight, and TED hose during day. Also, encouraged walking every 1-2 hours during day. Educated on HEP with  focus on mobility the first weeks. Discussed doing exercises within pain control and if pain increasing could decreased ROM, reps, and stop exercises as needed. Encouraged to perform quad sets and ankle pumps frequently for blood flow and to promote full knee extension.      Pertinent Vitals/Pain Pain Assessment Pain Assessment: 0-10 Pain Score: 3  Pain Location: L LE and knee Pain Descriptors / Indicators: Discomfort Pain Intervention(s): Limited activity within patient's tolerance, Monitored during session, Premedicated before session, Repositioned    Home Living Family/patient expects to be discharged to:: Private residence Living Arrangements: Spouse/significant other Available Help at Discharge: Family Type of Home: House Home Access: Stairs to enter Entrance Stairs-Rails: Doctor, general practice of Steps: 2   Home Layout: One level Home Equipment: Rollator (4 wheels);Cane - single point      Prior Function            PT Goals (current goals can now be found in the care plan section) Progress towards PT goals: Progressing toward goals    Frequency    7X/week      PT Plan      Co-evaluation              AM-PAC PT 6 Clicks Mobility   Outcome Measure  Help needed turning from your back to your side while in a flat bed without using bedrails?: None Help needed moving from lying on your back to sitting on the side of a flat bed without using bedrails?: A Little Help needed moving to and from a bed to a chair (including a wheelchair)?: A Little Help needed standing up from a chair using your arms (e.g., wheelchair or bedside chair)?: A Little Help needed to walk in hospital room?: A Little Help needed climbing 3-5 steps with a railing? : A Little 6 Click Score: 19    End of Session Equipment Utilized During Treatment: Gait belt Activity Tolerance: Patient tolerated treatment well Patient left: in chair;with call bell/phone within reach;with  family/visitor present Nurse Communication: Mobility status PT Visit Diagnosis: Unsteadiness on feet (R26.81);Other abnormalities of gait and mobility (R26.89);Muscle weakness (generalized) (M62.81);Difficulty in walking, not elsewhere classified (R26.2);Pain Pain - Right/Left: Left Pain - part of body: Knee;Leg     Time: 8960-8890 PT Time Calculation (min) (ACUTE ONLY): 30 min  Charges:    $Gait Training: 8-22 mins $Therapeutic Exercise: 8-22 mins PT General Charges $$ ACUTE PT VISIT: 1 Visit                     Benjiman, PT Acute Rehab [facility] [phone]    Benjiman VEAR Mulberry 01/03/2024, 12:23 PM

## 2024-01-03 NOTE — Progress Notes (Signed)
 Discharge instructions given to patient questions asked and answered. Sylvia Everts RN

## 2024-01-03 NOTE — Progress Notes (Signed)
 TOC med in a secure bag delivered to patient in room by this RN
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
## 2024-01-03 NOTE — Plan of Care (Signed)

## 2024-01-03 NOTE — Plan of Care (Signed)
  Problem: Pain Management: Goal: Pain level will decrease with appropriate interventions Outcome: Progressing   Problem: Clinical Measurements: Goal: Postoperative complications will be avoided or minimized Outcome: Progressing   Problem: Activity: Goal: Range of joint motion will improve Outcome: Progressing   Problem: Education: Goal: Knowledge of the prescribed therapeutic regimen will improve Outcome: Progressing   Problem: Safety: Goal: Ability to remain free from injury will improve Outcome: Progressing

## 2024-01-03 NOTE — TOC Transition Note (Signed)
 Transition of Care [facility]) - Discharge Note   Patient Details  Name: William Savage MRN: [id number] Date of Birth: 1960/12/27  [facility]) CM/SW Contact:  Alfonse JONELLE Rex, RN Phone Number: 01/03/2024, 10:15 AM   Clinical Narrative:  Met with patient at bedside to review dc therapy and home equipment needs, patient confirmed OPPT [facility], patient [place], states he has not received any DME from [place]. NCM sent teams chat to Ortho PA, [place], confirmed RW and [facility] weeks ago. [employer] provided RW-delivered to bedside, patient to follow up with Bonni LIEN regarding DME referral sent by Ortho Office. No TOC needs.      Final next level of care: OP Rehab     Patient Goals and CMS Choice            Discharge Placement                       Discharge Plan and Services Additional resources added to the After Visit Summary for                  DME Arranged: Walker rolling DME Agency: [employer] Date DME Agency Contacted: 01/03/24 Time DME Agency Contacted: [phone] Representative spoke with at DME Agency: [facility]            Social Drivers of Health (SDOH) Interventions SDOH Screenings   Food Insecurity: No Food Insecurity (01/02/2024)  Housing: Low Risk  (01/02/2024)  Transportation Needs: No Transportation Needs (01/02/2024)  Utilities: Not At Risk (01/02/2024)  Financial Resource Strain: Low Risk  (07/12/2022)   Received [facility]  Social Connections: Moderately Integrated (01/02/2024)  Stress: No Stress Concern Present (07/12/2022)   Received from [facility]  Tobacco Use: Low Risk  (01/02/2024)     Readmission Risk Interventions     No data to display

## 2024-01-04 ENCOUNTER — Encounter (HOSPITAL_COMMUNITY): Payer: Self-pay | Admitting: Orthopedic Surgery

## 2024-01-04 NOTE — Discharge Summary (Signed)
 Patient ID: William Savage MRN: [id number] DOB/AGE: 1960-07-15 63 y.o.  Admit date: 01/02/2024 Discharge date: 01/03/2024  Admission Diagnoses:  Left knee osteoarthritis  Discharge Diagnoses:  Principal Problem:   S/P total knee replacement, left Active Problems:   S/P total knee arthroplasty, left   Past Medical History:  Diagnosis Date   Ankle instability    bilateral   Chronic kidney disease    Diabetes (HCC)    Fatty liver    Fatty liver    Hyperlipidemia    Hypertension    IBS (irritable bowel syndrome)    Left shoulder strain    Migraines    Nonobstructive atherosclerosis of coronary artery    Plantar fasciitis    PTSD (post-traumatic stress disorder)    Radiculopathy    Sleep apnea    TIA (transient ischemic attack)     Surgeries: Procedure(s): ARTHROPLASTY, KNEE, TOTAL on 01/02/2024   Consultants:   Discharged Condition: Improved  Hospital Course: William Savage is an 63 y.o. male who was admitted 01/02/2024 for operative treatment ofS/P total knee replacement, left. Patient has severe unremitting pain that affects sleep, daily activities, and work/hobbies. After pre-op clearance the patient was taken to the operating room on 01/02/2024 and underwent  Procedure(s): ARTHROPLASTY, KNEE, TOTAL.    Patient was given perioperative antibiotics:  Anti-infectives (From admission, onward)    Start     Dose/Rate Route Frequency Ordered Stop   01/02/24 1600  ceFAZolin  (ANCEF ) IVPB 2g/100 mL premix        2 g 200 mL/hr over 30 Minutes Intravenous Every 6 hours 01/02/24 1249 01/02/24 2241   01/02/24 0745  ceFAZolin  (ANCEF ) IVPB 2g/100 mL premix        2 g 200 mL/hr over 30 Minutes Intravenous On call to O.R. 01/02/24 0737 01/02/24 1019        Patient was given sequential compression devices, early ambulation, and chemoprophylaxis to prevent DVT. Patient worked with PT and was meeting their goals regarding safe ambulation and transfers.  Patient  benefited maximally from hospital stay and there were no complications.    Recent vital signs: Patient Vitals for the past 24 hrs:  BP Temp Pulse Resp SpO2  01/03/24 0916 132/72 97.8 F (36.6 C) (!) 51 16 100 %     Recent laboratory studies:  Recent Labs    01/03/24 0340  WBC 13.3*  HGB 12.3*  HCT 39.6  PLT 214  NA 133*  K 4.6  CL 104  CO2 23  BUN 16  CREATININE 1.04  GLUCOSE 147*  CALCIUM  9.0     Discharge Medications:   Allergies as of 01/03/2024       Reactions   Mushroom Extract Complex (obsolete) Other (See Comments)   Gout flares, joint pain   Other Other (See Comments)   Allergic to purines    Shellfish Allergy Other (See Comments)        Medication List     STOP taking these medications    HYDROcodone-acetaminophen  5-325 MG tablet Commonly known as: NORCO/VICODIN   methylPREDNISolone 4 MG Tbpk tablet Commonly known as: MEDROL DOSEPAK       TAKE these medications    acetaminophen  500 MG tablet Commonly known as: TYLENOL  Take 2 tablets (1,000 mg total) by mouth every 6 (six) hours. What changed:  how much to take when to take this reasons to take this   amLODipine  10 MG tablet Commonly known as: NORVASC  Take by mouth.   aspirin  81 MG chewable  tablet Chew 1 tablet (81 mg total) by mouth 2 (two) times daily for 28 days. What changed: when to take this   celecoxib  100 MG capsule Commonly known as: CELEBREX  Take 100 mg by mouth 2 (two) times daily as needed for moderate pain (pain score 4-6).   Cholecalciferol 100 MCG (4000 UT) Tabs Take 8,000 Units by mouth daily.   colchicine 0.6 MG tablet Take 0.6 mg by mouth daily as needed (Gout flares).   diclofenac Sodium 1 % Gel Commonly known as: VOLTAREN Apply 4 g topically 4 (four) times daily as needed (Pain).   dicyclomine  10 MG capsule Commonly known as: BENTYL  Take 10 mg by mouth 4 (four) times daily as needed for spasms.   DSS 100 MG Caps Take 100 mg by mouth 2 (two) times  daily as needed (Constipation).   DULoxetine  30 MG capsule Commonly known as: CYMBALTA  Take 90 mg by mouth daily.   ezetimibe  10 MG tablet Commonly known as: ZETIA  Take 10 mg by mouth daily.   GARLIC OIL PO Take 1 Dose by mouth daily.   GINGER PO Take 1 Dose by mouth daily.   losartan  100 MG tablet Commonly known as: COZAAR  Take 100 mg by mouth daily.   melatonin 3 MG Tabs tablet Take 3 mg by mouth at bedtime as needed (Sleep).   Metamucil 48.57 % Powd Generic drug: Psyllium Take 1 Dose by mouth daily.   methocarbamol  500 MG tablet Commonly known as: ROBAXIN  Take 1 tablet (500 mg total) by mouth every 6 (six) hours as needed for muscle spasms. What changed: when to take this   MULTIVITAMINS PO Take 1 tablet by mouth daily.   nitroGLYCERIN  0.4 MG SL tablet Commonly known as: NITROSTAT  Place 1 tablet (0.4 mg total) under the tongue every 5 (five) minutes as needed for up to 25 days for chest pain.   oxyCODONE  5 MG immediate release tablet Commonly known as: Oxy IR/ROXICODONE  Take 1 tablet (5 mg total) by mouth every 4 (four) hours as needed for severe pain (pain score 7-10).   polycarbophil 625 MG tablet Commonly known as: FIBERCON Take 1,250 mg by mouth daily.   polyethylene glycol 17 g packet Commonly known as: MIRALAX  / GLYCOLAX  Take 17 g by mouth 2 (two) times daily.   rizatriptan 10 MG tablet Commonly known as: MAXALT Take 10 mg by mouth as needed for migraine.   rosuvastatin  20 MG tablet Commonly known as: CRESTOR  Take 20 mg by mouth daily.   senna 8.6 MG Tabs tablet Commonly known as: SENOKOT Take 2 tablets (17.2 mg total) by mouth at bedtime for 14 days.   tadalafil 10 MG tablet Commonly known as: CIALIS Take 10 mg by mouth daily as needed for erectile dysfunction.   topiramate  25 MG tablet Commonly known as: TOPAMAX  Take 25 mg by mouth daily as needed (Migraines).   VITAMIN C PO Take 1 tablet by mouth daily.                Discharge Care Instructions  (From admission, onward)           Start     Ordered   01/03/24 0000  Change dressing       Comments: Maintain surgical dressing until follow up in the clinic. If the edges start to pull up, may reinforce with tape. If the dressing is no longer working, may remove and cover with gauze and tape, but must keep the area dry and clean.  Call with  any questions or concerns.   01/03/24 0901            Diagnostic Studies: No results found.  Disposition: Discharge disposition: 01-Home or Self Care       Discharge Instructions     Call MD / Call 911   Complete by: As directed    If you experience chest pain or shortness of breath, CALL 911 and be transported to the hospital emergency room.  If you develope a fever above 101 F, pus (white drainage) or increased drainage or redness at the wound, or calf pain, call your surgeon's office.   Change dressing   Complete by: As directed    Maintain surgical dressing until follow up in the clinic. If the edges start to pull up, may reinforce with tape. If the dressing is no longer working, may remove and cover with gauze and tape, but must keep the area dry and clean.  Call with any questions or concerns.   Constipation Prevention   Complete by: As directed    Drink plenty of fluids.  Prune juice may be helpful.  You may use a stool softener, such as Colace (over the counter) 100 mg twice a day.  Use MiraLax  (over the counter) for constipation as needed.   Diet - low sodium heart healthy   Complete by: As directed    Increase activity slowly as tolerated   Complete by: As directed    Weight bearing as tolerated with assist device (walker, cane, etc) as directed, use it as long as suggested by your surgeon or therapist, typically at least 4-6 weeks.   Post-operative opioid taper instructions:   Complete by: As directed    POST-OPERATIVE OPIOID TAPER INSTRUCTIONS: It is important to wean off of your opioid  medication as soon as possible. If you do not need pain medication after your surgery it is ok to stop day one. Opioids include: Codeine, Hydrocodone(Norco, Vicodin), Oxycodone (Percocet, oxycontin ) and hydromorphone  amongst others.  Long term and even short term use of opiods can cause: Increased pain response Dependence Constipation Depression Respiratory depression And more.  Withdrawal symptoms can include Flu like symptoms Nausea, vomiting And more Techniques to manage these symptoms Hydrate well Eat regular healthy meals Stay active Use relaxation techniques(deep breathing, meditating, yoga) Do Not substitute Alcohol  to help with tapering If you have been on opioids for less than two weeks and do not have pain than it is ok to stop all together.  Plan to wean off of opioids This plan should start within one week post op of your joint replacement. Maintain the same interval or time between taking each dose and first decrease the dose.  Cut the total daily intake of opioids by one tablet each day Next start to increase the time between doses. The last dose that should be eliminated is the evening dose.      TED hose   Complete by: As directed    Use stockings (TED hose) for 2 weeks on both leg(s).  You may remove them at night for sleeping.        Follow-up Information     Ernie Cough, MD. Schedule an appointment as soon as possible for a visit in 2 week(s).   Specialty: Orthopedic Surgery Contact information: [address] [place] [address] [phone]                  Signed: Rosina JONELLE Savage 01/04/2024, 7:26 AM

## 2024-02-05 ENCOUNTER — Ambulatory Visit (HOSPITAL_BASED_OUTPATIENT_CLINIC_OR_DEPARTMENT_OTHER): Attending: Orthopedic Surgery | Admitting: Physical Therapy

## 2024-02-05 ENCOUNTER — Other Ambulatory Visit: Payer: Self-pay

## 2024-02-05 ENCOUNTER — Encounter (HOSPITAL_BASED_OUTPATIENT_CLINIC_OR_DEPARTMENT_OTHER): Payer: Self-pay | Admitting: Physical Therapy

## 2024-02-05 DIAGNOSIS — M6281 Muscle weakness (generalized): Secondary | ICD-10-CM | POA: Diagnosis present

## 2024-02-05 DIAGNOSIS — M25662 Stiffness of left knee, not elsewhere classified: Secondary | ICD-10-CM | POA: Insufficient documentation

## 2024-02-05 DIAGNOSIS — M25562 Pain in left knee: Secondary | ICD-10-CM | POA: Diagnosis present

## 2024-02-05 DIAGNOSIS — R262 Difficulty in walking, not elsewhere classified: Secondary | ICD-10-CM | POA: Diagnosis present

## 2024-02-05 NOTE — Therapy (Unsigned)
 OUTPATIENT PHYSICAL THERAPY EVALUATION   Patient Name: William Savage MRN: [id number] DOB:[birth year], 63 y.o., male Today's Date: 02/05/2024  END OF SESSION:  PT End of Session - 02/05/24 1523     Visit Number 1    Number of Visits 16    Date for PT Re-Evaluation 04/01/24    Authorization Type [facility]- awaiting auth    PT Start Time 1445    PT Stop Time 1523    PT Time Calculation (min) 38 min    Activity Tolerance Patient tolerated treatment well    Behavior During Therapy WFL for tasks assessed/performed          Past Medical History:  Diagnosis Date   Ankle instability    bilateral   Chronic kidney disease    Diabetes (HCC)    Fatty liver    Fatty liver    Hyperlipidemia    Hypertension    IBS (irritable bowel syndrome)    Left shoulder strain    Migraines    Nonobstructive atherosclerosis of coronary artery    Plantar fasciitis    PTSD (post-traumatic stress disorder)    Radiculopathy    Sleep apnea    TIA (transient ischemic attack)    Past Surgical History:  Procedure Laterality Date   BUNIONECTOMY Right    COLONOSCOPY  2021   TOTAL KNEE ARTHROPLASTY Left 01/02/2024   Procedure: ARTHROPLASTY, KNEE, TOTAL;  Surgeon: Ernie Cough, MD;  Location: [facility];  Service: Orthopedics;  Laterality: Left;   Patient Active Problem List   Diagnosis Date Noted   S/P total knee arthroplasty, left 01/02/2024   S/P total knee replacement, left 01/02/2024   Effusion, left knee 03/27/2023   Idiopathic gout of multiple sites 03/27/2023   Radiculitis 03/27/2023   Chronic low back pain 03/27/2023   Osteoarthritis of left knee 03/27/2023   Bilateral plantar fasciitis 03/27/2023    REFERRING PROVIDER:  Ernie Cough, MD     REFERRING DIAG: [phone] (ICD-10-CM) - Presence of left artificial knee joint  S/p Lt TKA  Rationale for Evaluation and Treatment: Rehabilitation  THERAPY DIAG:  Acute pain of left knee  Stiffness of left knee, not elsewhere  classified  Difficulty in walking, not elsewhere classified  Muscle weakness (generalized)  ONSET DATE: DOS 01/02/24   SUBJECTIVE:                                                                                                                                                                                           SUBJECTIVE STATEMENT: Have been doing PT at [facility] is a member here.   PERTINENT HISTORY:  Bilat feet N/T  from time to time  PAIN:  Are you having pain? Yes: NPRS scale: 4/10 Pain location: Lt anterior knee Pain description: numb Aggravating factors: sleeping is rough Relieving factors: movement/exercise  PRECAUTIONS:  None  RED FLAGS: None   WEIGHT BEARING RESTRICTIONS:  No  FALLS:  Has patient fallen in last 6 months? No  LIVING ENVIRONMENT: Stairs in home but has bedroom on first level  OCCUPATION:  retired  PLOF:  Independent  PATIENT GOALS:  Walking over a mile, yoga, upright stationary cycling   OBJECTIVE:  Note: Objective measures were completed at Evaluation unless otherwise noted.   PATIENT SURVEYS:  LEFS  Extreme difficulty/unable (0), Quite a bit of difficulty (1), Moderate difficulty (2), Little difficulty (3), No difficulty (4) Survey date:    Any of your usual work, housework or school activities   2. Usual hobbies, recreational or sporting activities   3. Getting into/out of the bath   4. Walking between rooms   5. Putting on socks/shoes   6. Squatting    7. Lifting an object, like a bag of groceries from the floor   8. Performing light activities around your home   9. Performing heavy activities around your home   10. Getting into/out of a car   11. Walking 2 blocks   12. Walking 1 mile   13. Going up/down 10 stairs (1 flight)   14. Standing for 1 hour   15.  sitting for 1 hour   16. Running on even ground   17. Running on uneven ground   18. Making sharp turns while running fast   19. Hopping    20. Rolling over  in bed   Score total:  ***     COGNITIVE STATUS: Within functional limits for tasks assessed   SENSATION: Anterior numbness  EDEMA:  Yes: mild as expected post op  GAIT: Comments: EVAL  rollator and cane available, lacking full knee ext at heel strike   Body Part #1 Knee  PALPATION: EVAL: limited scar/skin mobility around knee  LOWER EXTREMITY ROM:     Active  Left eval  Knee flexion 84  Knee extension -10   (Blank rows = not tested)   TREATMENT DATE:   EVAL See HEP Skin massage for mobility Discussed using stationary, upright bike at home for ROM.     PATIENT EDUCATION:  Education details: Teacher, music of condition, POC, HEP, exercise form/rationale Person educated: Patient Education method: Explanation, Demonstration, Tactile cues, Verbal cues, and Handouts Education comprehension: verbalized understanding, returned demonstration, verbal cues required, tactile cues required, and needs further education  HOME EXERCISE PROGRAM: Access Code: [email or web address] URL: [email or web address] Date: 02/05/2024 Prepared by: Harlene Cordon  Program Notes massage skin around knee  Exercises - Seated Hamstring Stretch  - 2-3 x daily - 7 x weekly - 2 sets - 5 breaths hold - Standing Weight Shift  - 5 x daily - 7 x weekly - Stride Stance Weight Shift  - 5 x daily - 7 x weekly - Quadruped Rocking Backward  - 2-3 x daily - 7 x weekly - 1 sets - 10 reps - Standing Gluteal Sets  - Standing Quad Set    ASSESSMENT:  CLINICAL IMPRESSION: Patient is a 63 y.o. M who was seen today for physical therapy evaluation and treatment for s/p Lt TKA. He is just over a month out, lacking ROM and wants to do the pool for therapy. Discussed use of aquatics and transition to land-based exercises, once he is independent  in the pool, for functional workouts.  Pt is a member at [facility] so he has access to the pool. Will benefit from skilled PT to meet functional goals.    OBJECTIVE  IMPAIRMENTS: {opptimpairments:25111}.   ACTIVITY LIMITATIONS: {activitylimitations:27494}  PARTICIPATION LIMITATIONS: {participationrestrictions:25113}  PERSONAL FACTORS: {Personal factors:25162} are also affecting patient's functional outcome.   REHAB POTENTIAL: {rehabpotential:25112}  CLINICAL DECISION MAKING: {clinical decision making:25114}  EVALUATION COMPLEXITY: {Evaluation complexity:25115}   GOALS: Goals reviewed with patient? {yes/no:20286}  SHORT TERM GOALS: Target date: ***  *** Baseline: Goal status: INITIAL  2.  *** Baseline:  Goal status: INITIAL  3.  *** Baseline:  Goal status: INITIAL  4.  *** Baseline:  Goal status: INITIAL  5.  *** Baseline:  Goal status: INITIAL  6.  *** Baseline:  Goal status: INITIAL  LONG TERM GOALS: Target date: ***  *** Baseline:  Goal status: INITIAL  2.  *** Baseline:  Goal status: INITIAL  3.  *** Baseline:  Goal status: INITIAL  4.  *** Baseline:  Goal status: INITIAL  5.  *** Baseline:  Goal status: INITIAL  6.  *** Baseline:  Goal status: INITIAL   PLAN:  PT FREQUENCY: {rehab frequency:25116}  PT DURATION: {rehab duration:25117}  PLANNED INTERVENTIONS: {rehab planned interventions:25118::97110-Therapeutic exercises,97530- Therapeutic [phone]- Neuromuscular re-education,97535- Self Rjmz,02859- Manual therapy}.  PLAN FOR NEXT SESSION: ***   Harlene Cordon, PT, DPT 02/05/2024, 3:31 PM

## 2024-02-06 ENCOUNTER — Ambulatory Visit: Payer: Self-pay | Admitting: Cardiology

## 2024-02-08 ENCOUNTER — Other Ambulatory Visit (HOSPITAL_COMMUNITY): Payer: Self-pay

## 2024-02-08 ENCOUNTER — Encounter: Payer: Self-pay | Admitting: Cardiology

## 2024-02-08 ENCOUNTER — Ambulatory Visit: Attending: Cardiology | Admitting: Cardiology

## 2024-02-08 ENCOUNTER — Encounter: Payer: Self-pay | Admitting: *Deleted

## 2024-02-08 VITALS — BP 134/87 | HR 73 | Resp 16 | Ht 72.0 in | Wt 214.0 lb

## 2024-02-08 DIAGNOSIS — R002 Palpitations: Secondary | ICD-10-CM | POA: Diagnosis not present

## 2024-02-08 DIAGNOSIS — E782 Mixed hyperlipidemia: Secondary | ICD-10-CM | POA: Diagnosis not present

## 2024-02-08 DIAGNOSIS — I209 Angina pectoris, unspecified: Secondary | ICD-10-CM

## 2024-02-08 DIAGNOSIS — I10 Essential (primary) hypertension: Secondary | ICD-10-CM

## 2024-02-08 DIAGNOSIS — R7303 Prediabetes: Secondary | ICD-10-CM | POA: Diagnosis not present

## 2024-02-08 MED ORDER — ATENOLOL-CHLORTHALIDONE 50-25 MG PO TABS: 1.0000 | ORAL_TABLET | ORAL | 0 refills | Status: DC

## 2024-02-08 MED ORDER — ASPIRIN 81 MG PO CHEW
81.0000 mg | CHEWABLE_TABLET | Freq: Every day | ORAL | 2 refills | Status: AC
  Filled 2024-02-08: qty 90, 90d supply, fill #0

## 2024-02-08 MED ORDER — NITROGLYCERIN 0.4 MG SL SUBL
0.4000 mg | SUBLINGUAL_TABLET | SUBLINGUAL | 3 refills | Status: AC | PRN
  Filled 2024-02-08: qty 25, 1d supply, fill #0

## 2024-02-08 MED ORDER — ATENOLOL-CHLORTHALIDONE 50-25 MG PO TABS
1.0000 | ORAL_TABLET | ORAL | 0 refills | Status: AC
  Filled 2024-02-08: qty 30, 30d supply, fill #0

## 2024-02-08 NOTE — Progress Notes (Signed)
 Cardiology Office Note:  .   Date:  02/08/2024  ID:  [name], William Savage, MRN [id number] PCP: [facility] Providers Cardiologist:  Gordy Bergamo, MD   History of Present Illness: .   William Savage is a 63 y.o. African-American ex [employer] whose past medical history and cardiovascular risk factors include: Hypertension, hyperlipidemia, sleep apnea on CPAP, PTSD, mild nonobstructive CAD, obesity due to excess calories, Hx of TIA (around 2014), prediabetes.  Due to persistent chest discomfort, he had undergone cardiac catheterization on 07/12/2022 at [facility] revealing minimal 20 to 30% luminal irregularity with normal LVEF.  He underwent left knee arthroplasty 2 weeks ago and has done well without any periprocedural complications, presents here for routine follow-up of hypertension and hypercholesterolemia.  He has not had any further chest pain.  He is accompanied by his wife.  Cardiac Studies relevent.    Heart Catheterization: [facility] Everywhere: 07/12/2022 Hemodynamics: Aortic pressure 126/72, LVEDP 13  2.  Coronary system:   Left Main: Large caliber vessel with no angiographic evidence of  stenosis.   LAD system: Large caliber vessel, wraps around the apex.  Mild 20% disease distally otherwise no significant stenosis   LCX system: Large caliber vessel.  No significant stenosis.   RCA system: right dominant.  20 to 30% distal otherwise no significant stenosis.   3. LV gram: EF 55 to 60% with normal wall motion.     Discussed the use of AI scribe software for clinical note transcription with the patient, who gave verbal consent to proceed.  History of Present Illness [name] is a 62 year old male with hypertension and hyperlipidemia who presents with palpitations and lightheadedness.  He experiences episodes of palpitations and lightheadedness, with sensations of his heart racing and occasionally  skipping a beat, particularly when standing up quickly or rushing to the bathroom. These episodes are often accompanied by lightheadedness, necessitating him to sit down until the symptoms subside. Symptoms sometimes occur when outside or after straining during bowel movements. No chest pain or shortness of breath.  He underwent knee replacement surgery on January 02, 2024, and is currently in therapy. He uses oxycodone  as needed for pain management, which he is trying to discontinue. He also uses a CPAP machine regularly for sleep apnea.  He takes rosuvastatin  and ezetimibe  daily for hyperlipidemia. His last cholesterol panel in March 2025 showed elevated levels, with LDL at 158 mg/dL. No recent blood work since starting ezetimibe  four months ago.  He manages constipation with daily Metamucil, Miralax , and docusate, and has hemorrhoids contributing to straining during bowel movements.   Labs    Recent Labs    09/26/23 0902 12/25/23 1200 01/03/24 0340  NA 139 141 133*  K 4.2 4.3 4.6  CL 105 108 104  CO2 29 26 23   GLUCOSE 91 94 147*  BUN 14 10 16   CREATININE 1.27 1.36* 1.04  CALCIUM  9.7 9.2 9.0  GFRNONAA  --  60* >60        Latest Ref Rng & Units 01/03/2024    3:40 AM 12/25/2023   12:00 PM  CBC  WBC 4.0 - 10.5 K/uL 13.3  4.5   Hemoglobin 13.0 - 17.0 g/dL 87.6  87.0   Hematocrit 39.0 - 52.0 % 39.6  41.8   Platelets 150 - 400 K/uL 214  256    Care everywhere/Faxed External Labs:  Labs 09/07/2023: BUN 10, potassium 4.3, eGFR 60 mL, creatinine  1.35.  A1c 6.1%.  TSH normal at 2.267.  Hb 14.1/HCT 42.2, platelets 248.  LFTs normal.  Total cholesterol 121, triglycerides 97, HDL 43, LDL 158.  ROS  Review of Systems  Cardiovascular:  Positive for palpitations (occasional after straining in bathroom). Negative for chest pain, dyspnea on exertion and leg swelling.  Neurological:  Positive for dizziness (occasional when he stands up quickly).   Physical Exam:   VS:  BP 134/87 (BP  Location: Left Arm, Patient Position: Sitting, Cuff Size: Large)   Pulse 73   Resp 16   Ht 6' (1.829 m)   Wt 214 lb (97.1 kg)   SpO2 99%   BMI 29.02 kg/m    Wt Readings from Last 3 Encounters:  02/08/24 214 lb (97.1 kg)  01/02/24 220 lb (99.8 kg)  12/25/23 220 lb (99.8 kg)    BP Readings from Last 3 Encounters:  02/08/24 134/87  01/03/24 132/72  12/25/23 119/77   Physical Exam Neck:     Vascular: No carotid bruit or JVD.  Cardiovascular:     Rate and Rhythm: Normal rate and regular rhythm.     Pulses: Intact distal pulses.     Heart sounds: Normal heart sounds. No murmur heard.    No gallop.  Pulmonary:     Effort: Pulmonary effort is normal.     Breath sounds: Normal breath sounds.  Abdominal:     General: Bowel sounds are normal.     Palpations: Abdomen is soft.  Musculoskeletal:     Right lower leg: No edema.     Left lower leg: No edema.    EKG:    EKG Interpretation Date/Time:  Thursday February 08 2024 10:55:33 EDT Ventricular Rate:  73 PR Interval:  146 QRS Duration:  86 QT Interval:  364 QTC Calculation: 401 R Axis:   29  Text Interpretation: EKG 02/08/2024: Normal sinus rhythm at the rate of 73 bpm, normal EKG. Confirmed by Carolle Ishii, Jagadeesh ([id number]) on 02/08/2024 11:21:16 AM    ASSESSMENT AND PLAN: .      ICD-10-CM   1. Primary hypertension  I10 EKG 12-Lead    atenolol -chlorthalidone  (TENORETIC  50) 50-25 MG tablet    DISCONTINUED: atenolol -chlorthalidone  (TENORETIC  50) 50-25 MG tablet    2. Mixed hyperlipidemia  E78.2     3. Prediabetes  R73.03     4. Palpitations  R00.2 atenolol -chlorthalidone  (TENORETIC  50) 50-25 MG tablet    DISCONTINUED: atenolol -chlorthalidone  (TENORETIC  50) 50-25 MG tablet    5. Angina pectoris (HCC)  I20.9 nitroGLYCERIN  (NITROSTAT ) 0.4 MG SL tablet      Assessment and Plan Assessment & Plan Benign vasovagal episodes and palpitations Episodes of lightheadedness and palpitations, particularly when standing quickly or  after straining, attributed to vasovagal episodes. Symptoms are not dangerous and expected to improve with medication adjustment. - Prescribe Tenoretic , one pill once daily in the morning, for palpitations and blood pressure management. - Discontinue amlodipine  due to severe constipation.  Essential hypertension Blood pressure management requires adjustment due to side effects of amlodipine  contributing to constipation. - Prescribe Tenoretic , one pill once daily in the morning, for blood pressure management. - Discontinue amlodipine . - Follow up with Dr. Marcey Ore on September 3rd to assess blood pressure control and medication tolerance and follow-up of BMP.  Hyperlipidemia Continues on rosuvastatin  and ezetimibe . Previous LDL was 158 mg/dL; goal is to reduce LDL to less than 70 mg/dL. He follows with Dr. Marcey Ore.  Prediabetes Previously elevated hemoglobin A1c at 6.6%, now reduced to  5.6% with weight loss. - Encourage continued weight loss to further improve glycemic control.  Constipation and hemorrhoids Constipation likely exacerbated by amlodipine  and oxycodone  use. Hemorrhoids causing straining during bowel movements. - Discontinue amlodipine  to reduce constipation. - Continue Metamucil, Miralax , and docusate. - Encourage hydration to alleviate constipation.  Obstructive sleep apnea Continues to use CPAP machine.   Follow up: From cardiac standpoint he is presently doing well, hence I will see him back on a as needed basis.  Signed,  Gordy Bergamo, MD, [facility] 02/08/2024, 11:46 AM [facility] [address] [place], [place] [address] Phone: [phone]. Fax:  [phone]

## 2024-02-08 NOTE — Addendum Note (Signed)
 Addended by: Zelena Bushong L on: 02/08/2024 11:55 AM   Modules accepted: Orders

## 2024-02-08 NOTE — Patient Instructions (Addendum)
 Medication Instructions:  Your physician has recommended you make the following change in your medication: Start tenoretic  50 mg by mouth daily   *If you need a refill on your cardiac medications before your next appointment, please call your pharmacy*  Lab Work: none If you have labs (blood work) drawn today and your tests are completely normal, you will receive your results only by: MyChart Message (if you have MyChart) OR A paper copy in the mail If you have any lab test that is abnormal or we need to change your treatment, we will call you to review the results.  Testing/Procedures: none  Follow-Up: At [facility], you and your health needs are our priority.  As part of our continuing mission to provide you with exceptional heart care, our providers are all part of one team.  This team includes your primary Cardiologist (physician) and Advanced Practice Providers or APPs (Physician Assistants and Nurse Practitioners) who all work together to provide you with the care you need, when you need it.  Your next appointment:   As needed  Provider:   Gordy Bergamo, MD    We recommend signing up for the patient portal called MyChart.  Sign up information is provided on this After Visit Summary.  MyChart is used to connect with patients for Virtual Visits (Telemedicine).  Patients are able to view lab/test results, encounter notes, upcoming appointments, etc.  Non-urgent messages can be sent to your provider as well.   To learn more about what you can do with MyChart, go to [email or web address].

## 2024-02-21 ENCOUNTER — Ambulatory Visit (HOSPITAL_BASED_OUTPATIENT_CLINIC_OR_DEPARTMENT_OTHER): Attending: Orthopedic Surgery | Admitting: Physical Therapy

## 2024-02-21 ENCOUNTER — Encounter (HOSPITAL_BASED_OUTPATIENT_CLINIC_OR_DEPARTMENT_OTHER): Payer: Self-pay | Admitting: Physical Therapy

## 2024-02-21 DIAGNOSIS — M25662 Stiffness of left knee, not elsewhere classified: Secondary | ICD-10-CM | POA: Insufficient documentation

## 2024-02-21 DIAGNOSIS — M25562 Pain in left knee: Secondary | ICD-10-CM | POA: Insufficient documentation

## 2024-02-21 DIAGNOSIS — M6281 Muscle weakness (generalized): Secondary | ICD-10-CM | POA: Diagnosis present

## 2024-02-21 DIAGNOSIS — R262 Difficulty in walking, not elsewhere classified: Secondary | ICD-10-CM | POA: Insufficient documentation

## 2024-02-21 DIAGNOSIS — I119 Hypertensive heart disease without heart failure: Secondary | ICD-10-CM | POA: Diagnosis not present

## 2024-02-21 DIAGNOSIS — E119 Type 2 diabetes mellitus without complications: Secondary | ICD-10-CM | POA: Diagnosis not present

## 2024-02-21 NOTE — Therapy (Signed)
 OUTPATIENT PHYSICAL THERAPY EVALUATION   Patient Name: William Savage MRN: [id number] DOB:05-Nov-1960, 63 y.o., male Today's Date: 02/21/2024  END OF SESSION:  PT End of Session - 02/21/24 0733     Visit Number 2    Number of Visits 16    Date for PT Re-Evaluation 04/01/24    Authorization Type VA- awaiting auth    PT Start Time 0730    PT Stop Time 0810    PT Time Calculation (min) 40 min    Activity Tolerance Patient tolerated treatment well    Behavior During Therapy [facility] for tasks assessed/performed          Past Medical History:  Diagnosis Date   Ankle instability    bilateral   Chronic kidney disease    Diabetes (HCC)    Fatty liver    Fatty liver    Hyperlipidemia    Hypertension    IBS (irritable bowel syndrome)    Left shoulder strain    Migraines    Nonobstructive atherosclerosis of coronary artery    Plantar fasciitis    PTSD (post-traumatic stress disorder)    Radiculopathy    Sleep apnea    TIA (transient ischemic attack)    Past Surgical History:  Procedure Laterality Date   BUNIONECTOMY Right    COLONOSCOPY  2021   TOTAL KNEE ARTHROPLASTY Left 01/02/2024   Procedure: ARTHROPLASTY, KNEE, TOTAL;  Surgeon: Ernie Cough, MD;  Location: [facility];  Service: Orthopedics;  Laterality: Left;   Patient Active Problem List   Diagnosis Date Noted   S/P total knee arthroplasty, left 01/02/2024   S/P total knee replacement, left 01/02/2024   Effusion, left knee 03/27/2023   Idiopathic gout of multiple sites 03/27/2023   Radiculitis 03/27/2023   Chronic low back pain 03/27/2023   Osteoarthritis of left knee 03/27/2023   Bilateral plantar fasciitis 03/27/2023    REFERRING PROVIDER:  Ernie Cough, MD     REFERRING DIAG: [phone] (ICD-10-CM) - Presence of left artificial knee joint  S/p Lt TKA  Rationale for Evaluation and Treatment: Rehabilitation  THERAPY DIAG:  Acute pain of left knee  Stiffness of left knee, not elsewhere  classified  Difficulty in walking, not elsewhere classified  Muscle weakness (generalized)  ONSET DATE: DOS 01/02/24   SUBJECTIVE:                                                                                                                                                                                           SUBJECTIVE STATEMENT: Have been doing PT at [facility] ridge is a member here.   PERTINENT HISTORY:  Bilat feet N/T  from time to time  PAIN:  Are you having pain? Yes: NPRS scale: 4/10 Pain location: Lt anterior knee Pain description: numb Aggravating factors: sleeping is rough Relieving factors: movement/exercise  PRECAUTIONS:  None  RED FLAGS: None   WEIGHT BEARING RESTRICTIONS:  No  FALLS:  Has patient fallen in last 6 months? No  LIVING ENVIRONMENT: Stairs in home but has bedroom on first level  OCCUPATION:  retired  PLOF:  Independent  PATIENT GOALS:  Walking over a mile, yoga, upright stationary cycling   OBJECTIVE:  Note: Objective measures were completed at Evaluation unless otherwise noted.   PATIENT SURVEYS:  LEFS  Extreme difficulty/unable (0), Quite a bit of difficulty (1), Moderate difficulty (2), Little difficulty (3), No difficulty (4) Survey date:  02/21/24  Any of your usual work, housework or school activities 0  2. Usual hobbies, recreational or sporting activities 0  3. Getting into/out of the bath 1  4. Walking between rooms 1  5. Putting on socks/shoes 0  6. Squatting  0  7. Lifting an object, like a bag of groceries from the floor 1  8. Performing light activities around your home 2  9. Performing heavy activities around your home 0  10. Getting into/out of a car 1  11. Walking 2 blocks 0  12. Walking 1 mile 0  13. Going up/down 10 stairs (1 flight) 1  14. Standing for 1 hour 0  15.  sitting for 1 hour 1  16. Running on even ground 0  17. Running on uneven ground 0  18. Making sharp turns while running fast 0  19.  Hopping  0  20. Rolling over in bed 1  Score total:  9/80     COGNITIVE STATUS: Within functional limits for tasks assessed   SENSATION: Anterior numbness  EDEMA:  Yes: mild as expected post op  GAIT: Comments: EVAL  rollator and cane available, lacking full knee ext at heel strike   Body Part #1 Knee  PALPATION: EVAL: limited scar/skin mobility around knee  LOWER EXTREMITY ROM:     Active  Left eval  Knee flexion 84  Knee extension -10   (Blank rows = not tested)   TREATMENT DATE:  [facility] Adult PT Treatment:                                                DATE: 02/21/24 Pt seen for aquatic therapy today.  Treatment took place in water  3.5-4.75 ft in depth at the [facility] pool. Temp of water  was 91.  Pt entered/exited the pool via stairs using step to pattern with hand rail.  *walking forward, back and side stepping in 3.6 ft with unsupported; marching cues for heel strike and toe off *side stepping cues for high knee flex->ue yellow HB add/abd *side lunge 4.0 ft->3.6  *Left quad stretch 2nd step *Hamstring/IT band stretch using big yellow noodle in 4.0 ft *LAQ left knee support by yellow noodle x 10->added ankle foam for resisted HS curl x 10 cues for controled extension *forward marching with kick *step up bottom step leading L/R.  Cues for weight shift and control for quad and glut engagement.->2nd step x 3-4. VC and demo for execution (good challenge)  Pt requires the buoyancy and hydrostatic pressure of water  for support, and to offload joints by unweighting joint load by at  least 50 % in navel deep water  and by at least 75-80% in chest to neck deep water .  Viscosity of the water  is needed for resistance of strengthening. Water  current perturbations provides challenge to standing balance requiring increased core activation.   LEFS completed: 9/80   EVAL See HEP Skin massage for mobility Discussed using stationary, upright bike at home for ROM.      PATIENT EDUCATION:  Education details: Teacher, music of condition, POC, HEP, exercise form/rationale Person educated: Patient Education method: Explanation, Demonstration, Tactile cues, Verbal cues, and Handouts Education comprehension: verbalized understanding, returned demonstration, verbal cues required, tactile cues required, and needs further education  HOME EXERCISE PROGRAM: Access Code: [email or web address] URL: [email or web address] Date: 02/05/2024 Prepared by: Harlene Cordon  Program Notes massage skin around knee  Exercises - Seated Hamstring Stretch  - 2-3 x daily - 7 x weekly - 2 sets - 5 breaths hold - Standing Weight Shift  - 5 x daily - 7 x weekly - Stride Stance Weight Shift  - 5 x daily - 7 x weekly - Quadruped Rocking Backward  - 2-3 x daily - 7 x weekly - 1 sets - 10 reps - Standing Gluteal Sets  - Standing Quad Set    ASSESSMENT:  CLINICAL IMPRESSION: Pt demonstrates safety and independence in aquatic setting with therapist instructing from deck. He is confident in setting, moving throughout all depths easily.  Pt is well known to this clinic from past episodes which he has progressed well and benefit from it. He is a member here at [facility] and has been completing exercise regimen for past year.  Baseline pt had limited left knee ROM due to advanced oa.  Focus on stretching passively then with increased loading with good toleration. Cues provided for proper execution/ weight shift with step ups for proper quad/hamstring firing.  Good response to cues. Goals are ongoing.    Initial Subjective Patient is a 63 y.o. M who was seen today for physical therapy evaluation and treatment for s/p Lt TKA. He is just over a month out, lacking ROM and wants to do the pool for therapy. Discussed use of aquatics and transition to land-based exercises, once he is independent in the pool, for functional workouts.  Pt is a member at [facility] so he has access to the pool. Will  benefit from skilled PT to meet functional goals.     REHAB POTENTIAL: Good  CLINICAL DECISION MAKING: Stable/uncomplicated  EVALUATION COMPLEXITY: Low   GOALS: Goals reviewed with patient? Yes  SHORT TERM GOALS: Target date: 8/30  Knee ROM 0-90+ Baseline: Goal status: INITIAL  2.  Demo proper heel toe gait without cuing Baseline:  Goal status: INITIAL    LONG TERM GOALS: Target date: POC date  Independent with long term exercise program in gym and pool Baseline:  Goal status: INITIAL  2.  Knee ROM 0-120 Baseline:  Goal status: INITIAL  3.  Able to get in/out of chairs without UE assist, without increase in pain Baseline:  Goal status: INITIAL  4.  Demo SLS controlled balance for at least 10 s on surgical LE Baseline:  Goal status: INITIAL    PLAN:  PT FREQUENCY: 1-2x/week  PT DURATION: POC date  PLANNED INTERVENTIONS: 97164- PT Re-evaluation, 97750- Physical Performance Testing, 97110-Therapeutic exercises, 97530- Therapeutic activity, [id number]- Neuromuscular re-education, 97535- Self Care, 02859- Manual therapy, [id number]- Gait training, [phone]- Aquatic Therapy, [phone] (1-2 muscles), [id number] (3+ muscles)- Dry Needling, Patient/Family education, Balance training, Stair training, Taping, Joint mobilization, Spinal mobilization,  Scar mobilization, and Cryotherapy.  PLAN FOR NEXT SESSION: complete LEFS   Ronal Foots) Tamee Battin MPT 02/21/24 7:37 AM [facility] [address] [place], [place], [address] Phone: [phone]   Fax:  [phone]

## 2024-02-23 ENCOUNTER — Encounter (HOSPITAL_BASED_OUTPATIENT_CLINIC_OR_DEPARTMENT_OTHER): Payer: Self-pay | Admitting: Physical Therapy

## 2024-02-23 ENCOUNTER — Ambulatory Visit (HOSPITAL_BASED_OUTPATIENT_CLINIC_OR_DEPARTMENT_OTHER): Admitting: Physical Therapy

## 2024-02-23 DIAGNOSIS — M25562 Pain in left knee: Secondary | ICD-10-CM

## 2024-02-23 DIAGNOSIS — R262 Difficulty in walking, not elsewhere classified: Secondary | ICD-10-CM

## 2024-02-23 DIAGNOSIS — M25662 Stiffness of left knee, not elsewhere classified: Secondary | ICD-10-CM

## 2024-02-23 NOTE — Therapy (Signed)
 OUTPATIENT PHYSICAL THERAPY EVALUATION   Patient Name: William Savage MRN: [id number] DOB:1960/08/04, 63 y.o., male Today's Date: 02/23/2024  END OF SESSION:  PT End of Session - 02/23/24 1236     Visit Number 3    Number of Visits 16    Date for PT Re-Evaluation 04/01/24    Authorization Type VA- awaiting auth    PT Start Time 1145    PT Stop Time 1229    PT Time Calculation (min) 44 min    Activity Tolerance Patient tolerated treatment well    Behavior During Therapy WFL for tasks assessed/performed           Past Medical History:  Diagnosis Date   Ankle instability    bilateral   Chronic kidney disease    Diabetes (HCC)    Fatty liver    Fatty liver    Hyperlipidemia    Hypertension    IBS (irritable bowel syndrome)    Left shoulder strain    Migraines    Nonobstructive atherosclerosis of coronary artery    Plantar fasciitis    PTSD (post-traumatic stress disorder)    Radiculopathy    Sleep apnea    TIA (transient ischemic attack)    Past Surgical History:  Procedure Laterality Date   BUNIONECTOMY Right    COLONOSCOPY  2021   TOTAL KNEE ARTHROPLASTY Left 01/02/2024   Procedure: ARTHROPLASTY, KNEE, TOTAL;  Surgeon: Ernie Cough, MD;  Location: [facility];  Service: Orthopedics;  Laterality: Left;   Patient Active Problem List   Diagnosis Date Noted   S/P total knee arthroplasty, left 01/02/2024   S/P total knee replacement, left 01/02/2024   Effusion, left knee 03/27/2023   Idiopathic gout of multiple sites 03/27/2023   Radiculitis 03/27/2023   Chronic low back pain 03/27/2023   Osteoarthritis of left knee 03/27/2023   Bilateral plantar fasciitis 03/27/2023    REFERRING PROVIDER:  Ernie Cough, MD     REFERRING DIAG: [phone] (ICD-10-CM) - Presence of left artificial knee joint  S/p Lt TKA  Rationale for Evaluation and Treatment: Rehabilitation  THERAPY DIAG:  Acute pain of left knee  Stiffness of left knee, not elsewhere  classified  Difficulty in walking, not elsewhere classified  ONSET DATE: DOS 01/02/24   SUBJECTIVE:                                                                                                                                                                                           SUBJECTIVE STATEMENT: Pt reports compliance with hep/stretching of left knee as instructed last session. Left knee pain 7/10 today  PERTINENT HISTORY:  Bilat  feet N/T from time to time  PAIN:  Are you having pain? Yes: NPRS scale: 4/10 Pain location: Lt anterior knee Pain description: numb Aggravating factors: sleeping is rough Relieving factors: movement/exercise  PRECAUTIONS:  None  RED FLAGS: None   WEIGHT BEARING RESTRICTIONS:  No  FALLS:  Has patient fallen in last 6 months? No  LIVING ENVIRONMENT: Stairs in home but has bedroom on first level  OCCUPATION:  retired  PLOF:  Independent  PATIENT GOALS:  Walking over a mile, yoga, upright stationary cycling   OBJECTIVE:  Note: Objective measures were completed at Evaluation unless otherwise noted.   PATIENT SURVEYS:  LEFS  Extreme difficulty/unable (0), Quite a bit of difficulty (1), Moderate difficulty (2), Little difficulty (3), No difficulty (4) Survey date:  02/21/24  Any of your usual work, housework or school activities 0  2. Usual hobbies, recreational or sporting activities 0  3. Getting into/out of the bath 1  4. Walking between rooms 1  5. Putting on socks/shoes 0  6. Squatting  0  7. Lifting an object, like a bag of groceries from the floor 1  8. Performing light activities around your home 2  9. Performing heavy activities around your home 0  10. Getting into/out of a car 1  11. Walking 2 blocks 0  12. Walking 1 mile 0  13. Going up/down 10 stairs (1 flight) 1  14. Standing for 1 hour 0  15.  sitting for 1 hour 1  16. Running on even ground 0  17. Running on uneven ground 0  18. Making sharp turns while  running fast 0  19. Hopping  0  20. Rolling over in bed 1  Score total:  9/80     COGNITIVE STATUS: Within functional limits for tasks assessed   SENSATION: Anterior numbness  EDEMA:  Yes: mild as expected post op  GAIT: Comments: EVAL  rollator and cane available, lacking full knee ext at heel strike   Body Part #1 Knee  PALPATION: EVAL: limited scar/skin mobility around knee  LOWER EXTREMITY ROM:     Active  Left eval  Knee flexion 84  Knee extension -10   (Blank rows = not tested)   TREATMENT DATE:  [facility] Adult PT Treatment:                                                DATE: 02/23/24 Pt seen for aquatic therapy today.  Treatment took place in water  3.5-4.75 ft in depth at the [facility] pool. Temp of water  was 91.  Pt entered/exited the pool via stairs using step to pattern with hand rail.  *walking forward, back and side stepping in 3.6 ft with unsupported; marching cues for heel strike and toe off *side stepping cues for high knee flex->ue yellow HB add/abd *side lunge 4.0 ft->3.6  *Forward split squat 4 widths with pause in flex ue support yellow HB *step up bottom step leading L/R.  Improved weight shift and engagement of quad and HS. *Toe tapping 2nd step alternating x 10 *Step up onto 2nd step unilateral ue support x7 R/L. Then x 5 leading left additional working on unsupported completion *Hamstring/IT band stretch using big yellow noodle in 4.0 ft *walking between exercises for recovery   Pt requires the buoyancy and hydrostatic pressure of water  for support, and to offload joints  by unweighting joint load by at least 50 % in navel deep water  and by at least 75-80% in chest to neck deep water .  Viscosity of the water  is needed for resistance of strengthening. Water  current perturbations provides challenge to standing balance requiring increased core activation.       PATIENT EDUCATION:  Education details: Teacher, music of condition, POC, HEP,  exercise form/rationale Person educated: Patient Education method: Explanation, Demonstration, Tactile cues, Verbal cues, and Handouts Education comprehension: verbalized understanding, returned demonstration, verbal cues required, tactile cues required, and needs further education  HOME EXERCISE PROGRAM: Access Code: [email or web address] URL: [email or web address] Date: 02/05/2024 Prepared by: Harlene Cordon  Program Notes massage skin around knee  Exercises - Seated Hamstring Stretch  - 2-3 x daily - 7 x weekly - 2 sets - 5 breaths hold - Standing Weight Shift  - 5 x daily - 7 x weekly - Stride Stance Weight Shift  - 5 x daily - 7 x weekly - Quadruped Rocking Backward  - 2-3 x daily - 7 x weekly - 1 sets - 10 reps - Standing Gluteal Sets  - Standing Quad Set    ASSESSMENT:  CLINICAL IMPRESSION: Excellent effort today.  He reports compliance with HEP as instructed with increased intensity. Improved quad and HS activation with step ups.  Pt edu on progression of TKR rehab. Goals ongoing     Initial Subjective Patient is a 63 y.o. M who was seen today for physical therapy evaluation and treatment for s/p Lt TKA. He is just over a month out, lacking ROM and wants to do the pool for therapy. Discussed use of aquatics and transition to land-based exercises, once he is independent in the pool, for functional workouts.  Pt is a member at [facility] so he has access to the pool. Will benefit from skilled PT to meet functional goals.     REHAB POTENTIAL: Good  CLINICAL DECISION MAKING: Stable/uncomplicated  EVALUATION COMPLEXITY: Low   GOALS: Goals reviewed with patient? Yes  SHORT TERM GOALS: Target date: 8/30  Knee ROM 0-90+ Baseline: Goal status: INITIAL  2.  Demo proper heel toe gait without cuing Baseline:  Goal status: INITIAL    LONG TERM GOALS: Target date: POC date  Independent with long term exercise program in gym and pool Baseline:  Goal status:  INITIAL  2.  Knee ROM 0-120 Baseline:  Goal status: INITIAL  3.  Able to get in/out of chairs without UE assist, without increase in pain Baseline:  Goal status: INITIAL  4.  Demo SLS controlled balance for at least 10 s on surgical LE Baseline:  Goal status: INITIAL    PLAN:  PT FREQUENCY: 1-2x/week  PT DURATION: POC date  PLANNED INTERVENTIONS: 97164- PT Re-evaluation, 97750- Physical Performance Testing, 97110-Therapeutic exercises, 97530- Therapeutic activity, 97112- Neuromuscular re-education, 97535- Self Care, 02859- Manual therapy, [id number]- Gait training, [phone]- Aquatic Therapy, [phone] (1-2 muscles), 20561 (3+ muscles)- Dry Needling, Patient/Family education, Balance training, Stair training, Taping, Joint mobilization, Spinal mobilization, Scar mobilization, and Cryotherapy.  PLAN FOR NEXT SESSION: complete LEFS   Ronal Foots) Adanna Zuckerman MPT 02/23/24 12:42 PM [facility] [address] [place], [place], [address] Phone: [phone]   Fax:  [phone]

## 2024-02-28 ENCOUNTER — Encounter (HOSPITAL_BASED_OUTPATIENT_CLINIC_OR_DEPARTMENT_OTHER): Payer: Self-pay | Admitting: Physical Therapy

## 2024-02-28 ENCOUNTER — Ambulatory Visit (HOSPITAL_BASED_OUTPATIENT_CLINIC_OR_DEPARTMENT_OTHER): Admitting: Physical Therapy

## 2024-02-28 DIAGNOSIS — M25562 Pain in left knee: Secondary | ICD-10-CM | POA: Diagnosis not present

## 2024-02-28 DIAGNOSIS — M25662 Stiffness of left knee, not elsewhere classified: Secondary | ICD-10-CM

## 2024-02-28 DIAGNOSIS — R262 Difficulty in walking, not elsewhere classified: Secondary | ICD-10-CM

## 2024-02-28 NOTE — Therapy (Signed)
 OUTPATIENT PHYSICAL THERAPY TREATMENT   Patient Name: William Savage MRN: [id number] DOB:1960/08/15, 63 y.o., male Today's Date: 02/28/2024  END OF SESSION:  PT End of Session - 02/28/24 0725     Visit Number 4    Number of Visits 16    Date for PT Re-Evaluation 04/01/24    Authorization Type [facility]- awaiting auth    PT Start Time 0715    PT Stop Time 0755    PT Time Calculation (min) 40 min    Activity Tolerance Patient tolerated treatment well    Behavior During Therapy [facility] for tasks assessed/performed           Past Medical History:  Diagnosis Date   Ankle instability    bilateral   Chronic kidney disease    Diabetes (HCC)    Fatty liver    Fatty liver    Hyperlipidemia    Hypertension    IBS (irritable bowel syndrome)    Left shoulder strain    Migraines    Nonobstructive atherosclerosis of coronary artery    Plantar fasciitis    PTSD (post-traumatic stress disorder)    Radiculopathy    Sleep apnea    TIA (transient ischemic attack)    Past Surgical History:  Procedure Laterality Date   BUNIONECTOMY Right    COLONOSCOPY  2021   TOTAL KNEE ARTHROPLASTY Left 01/02/2024   Procedure: ARTHROPLASTY, KNEE, TOTAL;  Surgeon: Ernie Cough, MD;  Location: [facility];  Service: Orthopedics;  Laterality: Left;   Patient Active Problem List   Diagnosis Date Noted   S/P total knee arthroplasty, left 01/02/2024   S/P total knee replacement, left 01/02/2024   Effusion, left knee 03/27/2023   Idiopathic gout of multiple sites 03/27/2023   Radiculitis 03/27/2023   Chronic low back pain 03/27/2023   Osteoarthritis of left knee 03/27/2023   Bilateral plantar fasciitis 03/27/2023    REFERRING PROVIDER:  Ernie Cough, MD     REFERRING DIAG: [phone] (ICD-10-CM) - Presence of left artificial knee joint  S/p Lt TKA  Rationale for Evaluation and Treatment: Rehabilitation  THERAPY DIAG:  Acute pain of left knee  Stiffness of left knee, not elsewhere  classified  Difficulty in walking, not elsewhere classified  ONSET DATE: DOS 01/02/24   SUBJECTIVE:                                                                                                                                                                                           SUBJECTIVE STATEMENT: Pt reports working hard everyday stretching and trying to gain strength  PERTINENT HISTORY:  Bilat feet N/T from time to time  PAIN:  Are you having pain? Yes: NPRS scale: 4/10 Pain location: Lt anterior knee Pain description: numb Aggravating factors: sleeping is rough Relieving factors: movement/exercise  PRECAUTIONS:  None  RED FLAGS: None   WEIGHT BEARING RESTRICTIONS:  No  FALLS:  Has patient fallen in last 6 months? No  LIVING ENVIRONMENT: Stairs in home but has bedroom on first level  OCCUPATION:  retired  PLOF:  Independent  PATIENT GOALS:  Walking over a mile, yoga, upright stationary cycling   OBJECTIVE:  Note: Objective measures were completed at Evaluation unless otherwise noted.   PATIENT SURVEYS:  LEFS  Extreme difficulty/unable (0), Quite a bit of difficulty (1), Moderate difficulty (2), Little difficulty (3), No difficulty (4) Survey date:  02/21/24  Any of your usual work, housework or school activities 0  2. Usual hobbies, recreational or sporting activities 0  3. Getting into/out of the bath 1  4. Walking between rooms 1  5. Putting on socks/shoes 0  6. Squatting  0  7. Lifting an object, like a bag of groceries from the floor 1  8. Performing light activities around your home 2  9. Performing heavy activities around your home 0  10. Getting into/out of a car 1  11. Walking 2 blocks 0  12. Walking 1 mile 0  13. Going up/down 10 stairs (1 flight) 1  14. Standing for 1 hour 0  15.  sitting for 1 hour 1  16. Running on even ground 0  17. Running on uneven ground 0  18. Making sharp turns while running fast 0  19. Hopping  0  20.  Rolling over in bed 1  Score total:  9/80     COGNITIVE STATUS: Within functional limits for tasks assessed   SENSATION: Anterior numbness  EDEMA:  Yes: mild as expected post op  GAIT: Comments: EVAL  rollator and cane available, lacking full knee ext at heel strike   Body Part #1 Knee  PALPATION: EVAL: limited scar/skin mobility around knee  LOWER EXTREMITY ROM:     Active  Left eval Left 02/28/24  Knee flexion 84 95 AAROM  Knee extension -10    (Blank rows = not tested)   TREATMENT DATE:  [facility] Adult PT Treatment:                                                DATE: 02/28/24 Pt seen for aquatic therapy today.  Treatment took place in water  3.5-4.75 ft in depth at the [facility] pool. Temp of water  was 91.  Pt entered/exited the pool via stairs using step to pattern with hand rail.  *walking forward, back and side stepping in 3.6 ft with unsupported; marching cues for heel strike and toe off *side stepping cues for high knee flex->ue yellow HB add/abd *side lunge 4.0 ft->3.6  *Forward split squat 4 widths with pause in flex ue support yellow HB. Cues for slowed pacing *step up bottom step leading L/R. *Runners step up leading right then left x 10 *Step up onto 2nd step unilateral ue support x10 R/L.  Cues and demonstration for execution *Toe tapping 2nd step alternating x 10 unsupported *hip flex stretch at step *SLS *walking between exercises for recovery   Pt requires the buoyancy and hydrostatic pressure of water  for support, and to offload joints by unweighting joint load by at  least 50 % in navel deep water  and by at least 75-80% in chest to neck deep water .  Viscosity of the water  is needed for resistance of strengthening. Water  current perturbations provides challenge to standing balance requiring increased core activation.       PATIENT EDUCATION:  Education details: Teacher, music of condition, POC, HEP, exercise form/rationale Person educated:  Patient Education method: Explanation, Demonstration, Tactile cues, Verbal cues, and Handouts Education comprehension: verbalized understanding, returned demonstration, verbal cues required, tactile cues required, and needs further education  HOME EXERCISE PROGRAM: Access Code: [email or web address] URL: [email or web address] Date: 02/05/2024 Prepared by: Harlene Cordon  Program Notes massage skin around knee  Exercises - Seated Hamstring Stretch  - 2-3 x daily - 7 x weekly - 2 sets - 5 breaths hold - Standing Weight Shift  - 5 x daily - 7 x weekly - Stride Stance Weight Shift  - 5 x daily - 7 x weekly - Quadruped Rocking Backward  - 2-3 x daily - 7 x weekly - 1 sets - 10 reps - Standing Gluteal Sets  - Standing Quad Set    ASSESSMENT:  CLINICAL IMPRESSION: Progressed therapy with pt remaining in 3.6 ft for optimal loading in setting.  Cues for compensatory muscle engagement with steps ups focused on quad , HS and glutes. Pt with good effort and ability to adjust. Focus on quad and HS activation. He demonstrates improved heel and toe strike with amb. Aggressive AA left knee flex 95d.   STGs met. Goals ongoing     Initial Subjective Patient is a 63 y.o. M who was seen today for physical therapy evaluation and treatment for s/p Lt TKA. He is just over a month out, lacking ROM and wants to do the pool for therapy. Discussed use of aquatics and transition to land-based exercises, once he is independent in the pool, for functional workouts.  Pt is a member at [facility] so he has access to the pool. Will benefit from skilled PT to meet functional goals.     REHAB POTENTIAL: Good  CLINICAL DECISION MAKING: Stable/uncomplicated  EVALUATION COMPLEXITY: Low   GOALS: Goals reviewed with patient? Yes  SHORT TERM GOALS: Target date: 8/30  Knee ROM 0-90+ Baseline: Goal status: Met 02/28/24  2.  Demo proper heel toe gait without cuing Baseline:  Goal status:Met  02/28/24    LONG TERM GOALS: Target date: POC date  Independent with long term exercise program in gym and pool Baseline:  Goal status: INITIAL  2.  Knee ROM 0-120 Baseline:  Goal status: INITIAL  3.  Able to get in/out of chairs without UE assist, without increase in pain Baseline:  Goal status: INITIAL  4.  Demo SLS controlled balance for at least 10 s on surgical LE Baseline:  Goal status: INITIAL    PLAN:  PT FREQUENCY: 1-2x/week  PT DURATION: POC date  PLANNED INTERVENTIONS: 97164- PT Re-evaluation, 97750- Physical Performance Testing, 97110-Therapeutic exercises, 97530- Therapeutic activity, 97112- Neuromuscular re-education, 97535- Self Care, 02859- Manual therapy, [id number]- Gait training, [phone]- Aquatic Therapy, [phone] (1-2 muscles), 20561 (3+ muscles)- Dry Needling, Patient/Family education, Balance training, Stair training, Taping, Joint mobilization, Spinal mobilization, Scar mobilization, and Cryotherapy.  PLAN FOR NEXT SESSION: complete LEFS   Ronal Foots) Rotha Cassels MPT 02/28/24 7:34 AM [facility] [address] [place], [place], [address] Phone: [phone]   Fax:  [phone]

## 2024-03-01 ENCOUNTER — Ambulatory Visit (HOSPITAL_BASED_OUTPATIENT_CLINIC_OR_DEPARTMENT_OTHER): Admitting: Physical Therapy

## 2024-03-04 ENCOUNTER — Ambulatory Visit (HOSPITAL_BASED_OUTPATIENT_CLINIC_OR_DEPARTMENT_OTHER): Admitting: Physical Therapy

## 2024-03-04 ENCOUNTER — Encounter (HOSPITAL_BASED_OUTPATIENT_CLINIC_OR_DEPARTMENT_OTHER): Payer: Self-pay | Admitting: Physical Therapy

## 2024-03-04 DIAGNOSIS — M25562 Pain in left knee: Secondary | ICD-10-CM | POA: Diagnosis not present

## 2024-03-04 DIAGNOSIS — M25662 Stiffness of left knee, not elsewhere classified: Secondary | ICD-10-CM

## 2024-03-04 DIAGNOSIS — R262 Difficulty in walking, not elsewhere classified: Secondary | ICD-10-CM

## 2024-03-04 NOTE — Therapy (Signed)
 OUTPATIENT PHYSICAL THERAPY TREATMENT   Patient Name: William Savage MRN: [id number] DOB:05/22/1961, 63 y.o., male Today's Date: 03/04/2024  END OF SESSION:  PT End of Session - 03/04/24 1405     Visit Number 5    Number of Visits 16    Date for PT Re-Evaluation 04/01/24    Authorization Type VA- awaiting auth    Authorization Time Period 15 visit from 8/26    Authorization - Visit Number 4    Authorization - Number of Visits 15    PT Start Time 1401    PT Stop Time 1440    PT Time Calculation (min) 39 min    Activity Tolerance Patient tolerated treatment well    Behavior During Therapy WFL for tasks assessed/performed           Past Medical History:  Diagnosis Date   Ankle instability    bilateral   Chronic kidney disease    Diabetes (HCC)    Fatty liver    Fatty liver    Hyperlipidemia    Hypertension    IBS (irritable bowel syndrome)    Left shoulder strain    Migraines    Nonobstructive atherosclerosis of coronary artery    Plantar fasciitis    PTSD (post-traumatic stress disorder)    Radiculopathy    Sleep apnea    TIA (transient ischemic attack)    Past Surgical History:  Procedure Laterality Date   BUNIONECTOMY Right    COLONOSCOPY  2021   TOTAL KNEE ARTHROPLASTY Left 01/02/2024   Procedure: ARTHROPLASTY, KNEE, TOTAL;  Surgeon: Ernie Cough, MD;  Location: [facility];  Service: Orthopedics;  Laterality: Left;   Patient Active Problem List   Diagnosis Date Noted   S/P total knee arthroplasty, left 01/02/2024   S/P total knee replacement, left 01/02/2024   Effusion, left knee 03/27/2023   Idiopathic gout of multiple sites 03/27/2023   Radiculitis 03/27/2023   Chronic low back pain 03/27/2023   Osteoarthritis of left knee 03/27/2023   Bilateral plantar fasciitis 03/27/2023    REFERRING PROVIDER:  Ernie Cough, MD     REFERRING DIAG: [phone] (ICD-10-CM) - Presence of left artificial knee joint  S/p Lt TKA  Rationale for Evaluation and  Treatment: Rehabilitation  THERAPY DIAG:  Acute pain of left knee  Stiffness of left knee, not elsewhere classified  Difficulty in walking, not elsewhere classified  ONSET DATE: DOS 01/02/24   SUBJECTIVE:  SUBJECTIVE STATEMENT: Pt reports working hard everyday stretching and trying to gain strength  PERTINENT HISTORY:  Bilat feet N/T from time to time  PAIN:  Are you having pain? Yes: NPRS scale: 4/10 Pain location: Lt anterior knee Pain description: numb Aggravating factors: sleeping is rough Relieving factors: movement/exercise  PRECAUTIONS:  None  RED FLAGS: None   WEIGHT BEARING RESTRICTIONS:  No  FALLS:  Has patient fallen in last 6 months? No  LIVING ENVIRONMENT: Stairs in home but has bedroom on first level  OCCUPATION:  retired  PLOF:  Independent  PATIENT GOALS:  Walking over a mile, yoga, upright stationary cycling   OBJECTIVE:  Note: Objective measures were completed at Evaluation unless otherwise noted.   PATIENT SURVEYS:  LEFS  Extreme difficulty/unable (0), Quite a bit of difficulty (1), Moderate difficulty (2), Little difficulty (3), No difficulty (4) Survey date:  02/21/24  Any of your usual work, housework or school activities 0  2. Usual hobbies, recreational or sporting activities 0  3. Getting into/out of the bath 1  4. Walking between rooms 1  5. Putting on socks/shoes 0  6. Squatting  0  7. Lifting an object, like a bag of groceries from the floor 1  8. Performing light activities around your home 2  9. Performing heavy activities around your home 0  10. Getting into/out of a car 1  11. Walking 2 blocks 0  12. Walking 1 mile 0  13. Going up/down 10 stairs (1 flight) 1  14. Standing for 1 hour 0  15.  sitting for 1 hour 1  16. Running on even  ground 0  17. Running on uneven ground 0  18. Making sharp turns while running fast 0  19. Hopping  0  20. Rolling over in bed 1  Score total:  9/80     COGNITIVE STATUS: Within functional limits for tasks assessed   SENSATION: Anterior numbness  EDEMA:  Yes: mild as expected post op  GAIT: Comments: EVAL  rollator and cane available, lacking full knee ext at heel strike   Body Part #1 Knee  PALPATION: EVAL: limited scar/skin mobility around knee  LOWER EXTREMITY ROM:     Active  Left eval Left 02/28/24  Knee flexion 84 95 AAROM  Knee extension -10    (Blank rows = not tested)   TREATMENT DATE:  [facility] Adult PT Treatment:                                                DATE: 02/29/24 Pt seen for aquatic therapy today.  Treatment took place in water  3.5-4.75 ft in depth at the [facility] pool. Temp of water  was 91.  Pt entered/exited the pool via stairs using step to pattern with hand rail.  *walking forward, back and side stepping in 3.6 ft with unsupported; marching cues for heel strike and toe off *side lunge 4.0 ft->3.6  *stretching HS, gastroc, quad; hip flex at steps *STS from 3 step x 5 (difficult due to lack of L knee flex) *Toe tapping 2nd step alternating x 10 unsupported *Step up onto 2nd step unilateral ue support 2 x 5 R/L *Forward split squat 4 widths with pause in flex ue support yellow HB. Cues for slowed pacing *SL squat ue support wall 3.6. Cues for TKE  x 10 *ROM left knee flex  taken AAROM: 96d   Pt requires the buoyancy and hydrostatic pressure of water  for support, and to offload joints by unweighting joint load by at least 50 % in navel deep water  and by at least 75-80% in chest to neck deep water .  Viscosity of the water  is needed for resistance of strengthening. Water  current perturbations provides challenge to standing balance requiring increased core activation.       PATIENT EDUCATION:  Education details: Teacher, music of condition,  POC, HEP, exercise form/rationale Person educated: Patient Education method: Explanation, Demonstration, Tactile cues, Verbal cues, and Handouts Education comprehension: verbalized understanding, returned demonstration, verbal cues required, tactile cues required, and needs further education  HOME EXERCISE PROGRAM: Access Code: [email or web address] URL: [email or web address] Date: 02/05/2024 Prepared by: Harlene Cordon  Program Notes massage skin around knee  Exercises - Seated Hamstring Stretch  - 2-3 x daily - 7 x weekly - 2 sets - 5 breaths hold - Standing Weight Shift  - 5 x daily - 7 x weekly - Stride Stance Weight Shift  - 5 x daily - 7 x weekly - Quadruped Rocking Backward  - 2-3 x daily - 7 x weekly - 1 sets - 10 reps - Standing Gluteal Sets  - Standing Quad Set    ASSESSMENT:  CLINICAL IMPRESSION: Pt tolerates extended times with left knee in flexed position stretching. Majority of session spent on aggressive knee flex increasing load as able as well as strengthening of quad/HS. ROM l knee 95d (pt putting forth daily effort to improve completing HEP's).  Goals ongoing     Initial Subjective Patient is a 63 y.o. M who was seen today for physical therapy evaluation and treatment for s/p Lt TKA. He is just over a month out, lacking ROM and wants to do the pool for therapy. Discussed use of aquatics and transition to land-based exercises, once he is independent in the pool, for functional workouts.  Pt is a member at [employer] so he has access to the pool. Will benefit from skilled PT to meet functional goals.     REHAB POTENTIAL: Good  CLINICAL DECISION MAKING: Stable/uncomplicated  EVALUATION COMPLEXITY: Low   GOALS: Goals reviewed with patient? Yes  SHORT TERM GOALS: Target date: 8/30  Knee ROM 0-90+ Baseline: Goal status: Met 02/28/24  2.  Demo proper heel toe gait without cuing Baseline:  Goal status:Met 02/28/24    LONG TERM GOALS: Target date:  POC date  Independent with long term exercise program in gym and pool Baseline:  Goal status: INITIAL  2.  Knee ROM 0-120 Baseline:  Goal status: INITIAL  3.  Able to get in/out of chairs without UE assist, without increase in pain Baseline:  Goal status: INITIAL  4.  Demo SLS controlled balance for at least 10 s on surgical LE Baseline:  Goal status: INITIAL    PLAN:  PT FREQUENCY: 1-2x/week  PT DURATION: POC date  PLANNED INTERVENTIONS: 97164- PT Re-evaluation, 97750- Physical Performance Testing, 97110-Therapeutic exercises, 97530- Therapeutic activity, 97112- Neuromuscular re-education, 97535- Self Care, 02859- Manual therapy, [id number]- Gait training, [phone]- Aquatic Therapy, [phone] (1-2 muscles), 20561 (3+ muscles)- Dry Needling, Patient/Family education, Balance training, Stair training, Taping, Joint mobilization, Spinal mobilization, Scar mobilization, and Cryotherapy.  PLAN FOR NEXT SESSION: complete LEFS   Ronal Foots) Joshaua Epple MPT 03/04/24 2:43 PM [facility] [address] [place], [place], [address] Phone: [phone]   Fax:  [phone]

## 2024-03-07 ENCOUNTER — Encounter (HOSPITAL_BASED_OUTPATIENT_CLINIC_OR_DEPARTMENT_OTHER): Payer: Self-pay | Admitting: Physical Therapy

## 2024-03-07 ENCOUNTER — Ambulatory Visit (HOSPITAL_BASED_OUTPATIENT_CLINIC_OR_DEPARTMENT_OTHER): Admitting: Physical Therapy

## 2024-03-07 DIAGNOSIS — M25562 Pain in left knee: Secondary | ICD-10-CM

## 2024-03-07 DIAGNOSIS — R262 Difficulty in walking, not elsewhere classified: Secondary | ICD-10-CM

## 2024-03-07 DIAGNOSIS — M25662 Stiffness of left knee, not elsewhere classified: Secondary | ICD-10-CM

## 2024-03-07 NOTE — Therapy (Signed)
 OUTPATIENT PHYSICAL THERAPY TREATMENT   Patient Name: William Savage MRN: [id number] DOB:1960/09/04, 63 y.o., male Today's Date: 03/07/2024  END OF SESSION:  PT End of Session - 03/07/24 0800     Visit Number 6    Number of Visits 16    Date for PT Re-Evaluation 04/01/24    Authorization Type [facility]- awaiting auth    Authorization Time Period 15 visit from 8/26    Authorization - Visit Number 5    Authorization - Number of Visits 15    PT Start Time 0800    PT Stop Time 0840    PT Time Calculation (min) 40 min    Activity Tolerance Patient tolerated treatment well    Behavior During Therapy [facility] for tasks assessed/performed           Past Medical History:  Diagnosis Date   Ankle instability    bilateral   Chronic kidney disease    Diabetes (HCC)    Fatty liver    Fatty liver    Hyperlipidemia    Hypertension    IBS (irritable bowel syndrome)    Left shoulder strain    Migraines    Nonobstructive atherosclerosis of coronary artery    Plantar fasciitis    PTSD (post-traumatic stress disorder)    Radiculopathy    Sleep apnea    TIA (transient ischemic attack)    Past Surgical History:  Procedure Laterality Date   BUNIONECTOMY Right    COLONOSCOPY  2021   TOTAL KNEE ARTHROPLASTY Left 01/02/2024   Procedure: ARTHROPLASTY, KNEE, TOTAL;  Surgeon: Ernie Cough, MD;  Location: [facility];  Service: Orthopedics;  Laterality: Left;   Patient Active Problem List   Diagnosis Date Noted   S/P total knee arthroplasty, left 01/02/2024   S/P total knee replacement, left 01/02/2024   Effusion, left knee 03/27/2023   Idiopathic gout of multiple sites 03/27/2023   Radiculitis 03/27/2023   Chronic low back pain 03/27/2023   Osteoarthritis of left knee 03/27/2023   Bilateral plantar fasciitis 03/27/2023    REFERRING PROVIDER:  Ernie Cough, MD     REFERRING DIAG: [phone] (ICD-10-CM) - Presence of left artificial knee joint  S/p Lt TKA  Rationale for Evaluation and  Treatment: Rehabilitation  THERAPY DIAG:  Acute pain of left knee  Stiffness of left knee, not elsewhere classified  Difficulty in walking, not elsewhere classified  ONSET DATE: DOS 01/02/24   SUBJECTIVE:  SUBJECTIVE STATEMENT: Pt reports stiffness this am pain 4/10  PERTINENT HISTORY:  Bilat feet N/T from time to time  PAIN:  Are you having pain? Yes: NPRS scale: 4/10 Pain location: Lt anterior knee Pain description: numb Aggravating factors: sleeping is rough Relieving factors: movement/exercise  PRECAUTIONS:  None  RED FLAGS: None   WEIGHT BEARING RESTRICTIONS:  No  FALLS:  Has patient fallen in last 6 months? No  LIVING ENVIRONMENT: Stairs in home but has bedroom on first level  OCCUPATION:  retired  PLOF:  Independent  PATIENT GOALS:  Walking over a mile, yoga, upright stationary cycling   OBJECTIVE:  Note: Objective measures were completed at Evaluation unless otherwise noted.   PATIENT SURVEYS:  LEFS  Extreme difficulty/unable (0), Quite a bit of difficulty (1), Moderate difficulty (2), Little difficulty (3), No difficulty (4) Survey date:  02/21/24  Any of your usual work, housework or school activities 0  2. Usual hobbies, recreational or sporting activities 0  3. Getting into/out of the bath 1  4. Walking between rooms 1  5. Putting on socks/shoes 0  6. Squatting  0  7. Lifting an object, like a bag of groceries from the floor 1  8. Performing light activities around your home 2  9. Performing heavy activities around your home 0  10. Getting into/out of a car 1  11. Walking 2 blocks 0  12. Walking 1 mile 0  13. Going up/down 10 stairs (1 flight) 1  14. Standing for 1 hour 0  15.  sitting for 1 hour 1  16. Running on even ground 0  17. Running on uneven  ground 0  18. Making sharp turns while running fast 0  19. Hopping  0  20. Rolling over in bed 1  Score total:  9/80     COGNITIVE STATUS: Within functional limits for tasks assessed   SENSATION: Anterior numbness  EDEMA:  Yes: mild as expected post op  GAIT: Comments: EVAL  rollator and cane available, lacking full knee ext at heel strike   Body Part #1 Knee  PALPATION: EVAL: limited scar/skin mobility around knee  LOWER EXTREMITY ROM:     Active  Left eval Left 02/28/24  Knee flexion 84 95 AAROM  Knee extension -10    (Blank rows = not tested)   TREATMENT DATE:  [facility] Adult PT Treatment:                                                DATE: 03/07/24 Pt seen for aquatic therapy today.  Treatment took place in water  3.5-4.75 ft in depth at the [facility] pool. Temp of water  was 91.  Pt entered/exited the pool via stairs using step to pattern with hand rail.  *walking forward, back and side stepping in 3.6 ft with unsupported; marching cues for heel strike and toe off *stretching HS, gastroc, quad; hip flex; adductors at steps *IT band using yellow noodle *Curtsy squat ue support blue HB (added balance and proprioceptive retraining) 3.6. Cues for TKE  x 10 *Step up onto 2nd step unilateral ue support 2 x 5 R/L *side lunge 3.6  *Tandem stance  *SLS ->add/abd blue HB *walking between exercises for recovery.   Pt requires the buoyancy and hydrostatic pressure of water  for support, and to offload joints by unweighting joint load by at least  50 % in navel deep water  and by at least 75-80% in chest to neck deep water .  Viscosity of the water  is needed for resistance of strengthening. Water  current perturbations provides challenge to standing balance requiring increased core activation.       PATIENT EDUCATION:  Education details: Teacher, music of condition, POC, HEP, exercise form/rationale Person educated: Patient Education method: Explanation, Demonstration,  Tactile cues, Verbal cues, and Handouts Education comprehension: verbalized understanding, returned demonstration, verbal cues required, tactile cues required, and needs further education  HOME EXERCISE PROGRAM: Access Code: [email or web address] URL: [email or web address] Date: 02/05/2024 Prepared by: Harlene Cordon  Program Notes massage skin around knee  Exercises - Seated Hamstring Stretch  - 2-3 x daily - 7 x weekly - 2 sets - 5 breaths hold - Standing Weight Shift  - 5 x daily - 7 x weekly - Stride Stance Weight Shift  - 5 x daily - 7 x weekly - Quadruped Rocking Backward  - 2-3 x daily - 7 x weekly - 1 sets - 10 reps - Standing Gluteal Sets  - Standing Quad Set    ASSESSMENT:  CLINICAL IMPRESSION: Reviewed exercises pt completing indep at home as well as the gym.  Instructed to add loaded HS curls.  He VU. Continued with focus on left Knee stretching, ROM and strengthening. Good toleration as well as excellent effort. He continues to have a fair amount of prepatellar edema. He is icing daily. VC and demonstration provided as needed. Pt compliant with HEP land and water .       Initial Subjective Patient is a 63 y.o. M who was seen today for physical therapy evaluation and treatment for s/p Lt TKA. He is just over a month out, lacking ROM and wants to do the pool for therapy. Discussed use of aquatics and transition to land-based exercises, once he is independent in the pool, for functional workouts.  Pt is a member at [facility] so he has access to the pool. Will benefit from skilled PT to meet functional goals.     REHAB POTENTIAL: Good  CLINICAL DECISION MAKING: Stable/uncomplicated  EVALUATION COMPLEXITY: Low   GOALS: Goals reviewed with patient? Yes  SHORT TERM GOALS: Target date: 8/30  Knee ROM 0-90+ Baseline: Goal status: Met 02/28/24  2.  Demo proper heel toe gait without cuing Baseline:  Goal status:Met 02/28/24    LONG TERM GOALS: Target date:  POC date  Independent with long term exercise program in gym and pool Baseline:  Goal status: INITIAL  2.  Knee ROM 0-120 Baseline:  Goal status: INITIAL  3.  Able to get in/out of chairs without UE assist, without increase in pain Baseline:  Goal status: INITIAL  4.  Demo SLS controlled balance for at least 10 s on surgical LE Baseline:  Goal status: INITIAL    PLAN:  PT FREQUENCY: 1-2x/week  PT DURATION: POC date  PLANNED INTERVENTIONS: 97164- PT Re-evaluation, 97750- Physical Performance Testing, 97110-Therapeutic exercises, 97530- Therapeutic activity, 97112- Neuromuscular re-education, 97535- Self Care, 02859- Manual therapy, [id number]- Gait training, [phone]- Aquatic Therapy, [phone] (1-2 muscles), 20561 (3+ muscles)- Dry Needling, Patient/Family education, Balance training, Stair training, Taping, Joint mobilization, Spinal mobilization, Scar mobilization, and Cryotherapy.  PLAN FOR NEXT SESSION: complete LEFS   Ronal Foots) Roe Koffman MPT 03/07/24 8:02 AM [facility] [address] [place], [place], [address] Phone: [phone]   Fax:  [phone]

## 2024-03-12 ENCOUNTER — Encounter (HOSPITAL_BASED_OUTPATIENT_CLINIC_OR_DEPARTMENT_OTHER): Payer: Self-pay | Admitting: Physical Therapy

## 2024-03-12 ENCOUNTER — Ambulatory Visit (HOSPITAL_BASED_OUTPATIENT_CLINIC_OR_DEPARTMENT_OTHER): Admitting: Physical Therapy

## 2024-03-12 DIAGNOSIS — M6281 Muscle weakness (generalized): Secondary | ICD-10-CM

## 2024-03-12 DIAGNOSIS — R262 Difficulty in walking, not elsewhere classified: Secondary | ICD-10-CM

## 2024-03-12 DIAGNOSIS — M25562 Pain in left knee: Secondary | ICD-10-CM | POA: Diagnosis not present

## 2024-03-12 DIAGNOSIS — M25662 Stiffness of left knee, not elsewhere classified: Secondary | ICD-10-CM

## 2024-03-12 NOTE — Therapy (Signed)
 OUTPATIENT PHYSICAL THERAPY TREATMENT   Patient Name: William Savage MRN: [id number] DOB:1960-07-31, 63 y.o., male Today's Date: 03/12/2024  END OF SESSION:  PT End of Session - 03/12/24 1057     Visit Number 7    Number of Visits 16    Date for Recertification  04/01/24    Authorization Type VA- awaiting auth    Authorization Time Period 15 visit from 8/26    Authorization - Visit Number 7    Authorization - Number of Visits 15    PT Start Time 1100    PT Stop Time 1140    PT Time Calculation (min) 40 min    Activity Tolerance Patient tolerated treatment well    Behavior During Therapy WFL for tasks assessed/performed           Past Medical History:  Diagnosis Date   Ankle instability    bilateral   Chronic kidney disease    Diabetes (HCC)    Fatty liver    Fatty liver    Hyperlipidemia    Hypertension    IBS (irritable bowel syndrome)    Left shoulder strain    Migraines    Nonobstructive atherosclerosis of coronary artery    Plantar fasciitis    PTSD (post-traumatic stress disorder)    Radiculopathy    Sleep apnea    TIA (transient ischemic attack)    Past Surgical History:  Procedure Laterality Date   BUNIONECTOMY Right    COLONOSCOPY  2021   TOTAL KNEE ARTHROPLASTY Left 01/02/2024   Procedure: ARTHROPLASTY, KNEE, TOTAL;  Surgeon: Ernie Cough, MD;  Location: [facility];  Service: Orthopedics;  Laterality: Left;   Patient Active Problem List   Diagnosis Date Noted   S/P total knee arthroplasty, left 01/02/2024   S/P total knee replacement, left 01/02/2024   Effusion, left knee 03/27/2023   Idiopathic gout of multiple sites 03/27/2023   Radiculitis 03/27/2023   Chronic low back pain 03/27/2023   Osteoarthritis of left knee 03/27/2023   Bilateral plantar fasciitis 03/27/2023    REFERRING PROVIDER:  Ernie Cough, MD     REFERRING DIAG: [phone] (ICD-10-CM) - Presence of left artificial knee joint  S/p Lt TKA  Rationale for Evaluation and  Treatment: Rehabilitation  THERAPY DIAG:  Acute pain of left knee  Stiffness of left knee, not elsewhere classified  Difficulty in walking, not elsewhere classified  Muscle weakness (generalized)  ONSET DATE: DOS 01/02/24   SUBJECTIVE:  SUBJECTIVE STATEMENT: Pt reports continued stiffness and pain in Lt knee.  Otherwise nothing new to report.   PERTINENT HISTORY:  Bilat feet N/T from time to time  PAIN:  Are you having pain? Yes: NPRS scale: 4/10 Pain location: Lt anterior knee Pain description: numb Aggravating factors: sleeping is rough Relieving factors: movement/exercise  PRECAUTIONS:  None  RED FLAGS: None   WEIGHT BEARING RESTRICTIONS:  No  FALLS:  Has patient fallen in last 6 months? No  LIVING ENVIRONMENT: Stairs in home but has bedroom on first level  OCCUPATION:  retired  PLOF:  Independent  PATIENT GOALS:  Walking over a mile, yoga, upright stationary cycling   OBJECTIVE:  Note: Objective measures were completed at Evaluation unless otherwise noted.   PATIENT SURVEYS:  LEFS  Extreme difficulty/unable (0), Quite a bit of difficulty (1), Moderate difficulty (2), Little difficulty (3), No difficulty (4) Survey date:  02/21/24 03/12/24  Any of your usual work, housework or school activities 0 0  2. Usual hobbies, recreational or sporting activities 0 0  3. Getting into/out of the bath 1 1  4. Walking between rooms 1 2  5. Putting on socks/shoes 0 1  6. Squatting  0 0  7. Lifting an object, like a bag of groceries from the floor 1 3  8. Performing light activities around your home 2 2  9. Performing heavy activities around your home 0 0  10. Getting into/out of a car 1 2  11. Walking 2 blocks 0 1  12. Walking 1 mile 0 0  13. Going up/down 10 stairs (1 flight)  1 2  14. Standing for 1 hour 0 0  15.  sitting for 1 hour 1 1  16. Running on even ground 0 0  17. Running on uneven ground 0 0  18. Making sharp turns while running fast 0 0  19. Hopping  0 0  20. Rolling over in bed 1 2  Score total:  9/80 17/80    COGNITIVE STATUS: Within functional limits for tasks assessed   SENSATION: Anterior numbness  EDEMA:  Yes: mild as expected post op  GAIT: Comments: EVAL  rollator and cane available, lacking full knee ext at heel strike   Body Part #1 Knee  PALPATION: EVAL: limited scar/skin mobility around knee  LOWER EXTREMITY ROM:     Active  Left eval Left 02/28/24  Knee flexion 84 95 AAROM  Knee extension -10    (Blank rows = not tested)   TREATMENT DATE:  [facility] Adult PT Treatment:                                                DATE: 03/12/24 Pt seen for aquatic therapy today.  Treatment took place in water  3.5-4.75 ft in depth at the [facility] pool. Temp of water  was 91.  Pt entered/exited the pool via stairs using step to pattern with hand rail.  *walking forwardand backward with cues for heel strike and TKE * forward walking kick * walking forward lunges in shallow area; side step into wide squat with arm add/abdct with yellow hand floats * noodle stomp with yellow thick noodle, light UE support on wall, cues for upright posture and core engagement * standing balance on yellow thick noodle with intermittent UE to steady; slow marching; mini squats * Lt quad stretch with  hollow blue noodle-> solid grey noodle  * Lt hamstring and adductor stretch with back against wall and solid grey noodle  * at stairs: forward lunge with Lt foot on 2nd step -> hamstring stretch with pt's overpressure into extension with foot on 3rd step x 2 rounds   Pt requires the buoyancy and hydrostatic pressure of water  for support, and to offload joints by unweighting joint load by at least 50 % in navel deep water  and by at least 75-80% in chest  to neck deep water .  Viscosity of the water  is needed for resistance of strengthening. Water  current perturbations provides challenge to standing balance requiring increased core activation.       PATIENT EDUCATION:  Education details:  exercise form/rationale Person educated: Patient Education method: Explanation, Demonstration, Tactile cues, Verbal cues, and Handouts Education comprehension: verbalized understanding, returned demonstration, verbal cues required, tactile cues required, and needs further education  HOME EXERCISE PROGRAM: Access Code: [email or web address] URL: [email or web address] Date: 02/05/2024 Prepared by: Harlene Cordon  Program Notes massage skin around knee  Exercises - Seated Hamstring Stretch  - 2-3 x daily - 7 x weekly - 2 sets - 5 breaths hold - Standing Weight Shift  - 5 x daily - 7 x weekly - Stride Stance Weight Shift  - 5 x daily - 7 x weekly - Quadruped Rocking Backward  - 2-3 x daily - 7 x weekly - 1 sets - 10 reps - Standing Gluteal Sets  - Standing Quad Set    ASSESSMENT:  CLINICAL IMPRESSION: Good toleration of aquatic exercises without increase in pain. Reviewed ice application parameters and recommended use of knee sleeve to assist with edema reduction as well as excellent effort. VC and demonstration provided as needed. LEFS improved by 8 points. Will continue to progress as tolerated.        Initial Subjective Patient is a 63 y.o. M who was seen today for physical therapy evaluation and treatment for s/p Lt TKA. He is just over a month out, lacking ROM and wants to do the pool for therapy. Discussed use of aquatics and transition to land-based exercises, once he is independent in the pool, for functional workouts.  Pt is a member at [facility] so he has access to the pool. Will benefit from skilled PT to meet functional goals.     REHAB POTENTIAL: Good  CLINICAL DECISION MAKING: Stable/uncomplicated  EVALUATION COMPLEXITY:  Low   GOALS: Goals reviewed with patient? Yes  SHORT TERM GOALS: Target date: 8/30  Knee ROM 0-90+ Baseline: Goal status: Met 02/28/24  2.  Demo proper heel toe gait without cuing Baseline:  Goal status:Met 02/28/24    LONG TERM GOALS: Target date: POC date  Independent with long term exercise program in gym and pool Baseline:  Goal status: INITIAL  2.  Knee ROM 0-120 Baseline:  Goal status: INITIAL  3.  Able to get in/out of chairs without UE assist, without increase in pain Baseline:  Goal status: INITIAL  4.  Demo SLS controlled balance for at least 10 s on surgical LE Baseline:  Goal status: INITIAL    PLAN:  PT FREQUENCY: 1-2x/week  PT DURATION: POC date  PLANNED INTERVENTIONS: 97164- PT Re-evaluation, 97750- Physical Performance Testing, 97110-Therapeutic exercises, 97530- Therapeutic activity, 97112- Neuromuscular re-education, 97535- Self Care, 02859- Manual therapy, [id number]- Gait training, [phone]- Aquatic Therapy, [phone] (1-2 muscles), 20561 (3+ muscles)- Dry Needling, Patient/Family education, Balance training, Stair training, Taping, Joint mobilization, Spinal mobilization, Scar mobilization, and Cryotherapy.  PLAN FOR  NEXT SESSION: continue progressive LE strengthening and Lt knee ROM.   Stairs - descending; STS without abdct of LLE.   Delon Aquas, PTA 03/12/24 11:48 AM [facility] [address] [place], [place], [address] Phone: [phone]   Fax:  [phone]

## 2024-03-14 ENCOUNTER — Encounter (HOSPITAL_BASED_OUTPATIENT_CLINIC_OR_DEPARTMENT_OTHER): Payer: Self-pay | Admitting: Physical Therapy

## 2024-03-14 ENCOUNTER — Ambulatory Visit (HOSPITAL_BASED_OUTPATIENT_CLINIC_OR_DEPARTMENT_OTHER): Admitting: Physical Therapy

## 2024-03-14 DIAGNOSIS — M25562 Pain in left knee: Secondary | ICD-10-CM

## 2024-03-14 DIAGNOSIS — M25662 Stiffness of left knee, not elsewhere classified: Secondary | ICD-10-CM

## 2024-03-14 DIAGNOSIS — R262 Difficulty in walking, not elsewhere classified: Secondary | ICD-10-CM

## 2024-03-14 NOTE — Therapy (Signed)
 OUTPATIENT PHYSICAL THERAPY TREATMENT   Patient Name: William Savage MRN: [id number] DOB:09-Jun-1961, 63 y.o., male Today's Date: 03/14/2024  END OF SESSION:  PT End of Session - 03/14/24 0858     Visit Number 8    Number of Visits 16    Date for Recertification  04/01/24    Authorization Type [facility]- awaiting auth    Authorization Time Period 15 visit from 8/26    Authorization - Visit Number 8    Authorization - Number of Visits 15    PT Start Time 0847    PT Stop Time 0925    PT Time Calculation (min) 38 min           Past Medical History:  Diagnosis Date   Ankle instability    bilateral   Chronic kidney disease    Diabetes (HCC)    Fatty liver    Fatty liver    Hyperlipidemia    Hypertension    IBS (irritable bowel syndrome)    Left shoulder strain    Migraines    Nonobstructive atherosclerosis of coronary artery    Plantar fasciitis    PTSD (post-traumatic stress disorder)    Radiculopathy    Sleep apnea    TIA (transient ischemic attack)    Past Surgical History:  Procedure Laterality Date   BUNIONECTOMY Right    COLONOSCOPY  2021   TOTAL KNEE ARTHROPLASTY Left 01/02/2024   Procedure: ARTHROPLASTY, KNEE, TOTAL;  Surgeon: Ernie Cough, MD;  Location: [facility];  Service: Orthopedics;  Laterality: Left;   Patient Active Problem List   Diagnosis Date Noted   S/P total knee arthroplasty, left 01/02/2024   S/P total knee replacement, left 01/02/2024   Effusion, left knee 03/27/2023   Idiopathic gout of multiple sites 03/27/2023   Radiculitis 03/27/2023   Chronic low back pain 03/27/2023   Osteoarthritis of left knee 03/27/2023   Bilateral plantar fasciitis 03/27/2023    REFERRING PROVIDER:  Ernie Cough, MD     REFERRING DIAG: [id number] (ICD-10-CM) - Presence of left artificial knee joint  S/p Lt TKA  Rationale for Evaluation and Treatment: Rehabilitation  THERAPY DIAG:  Acute pain of left knee  Stiffness of left knee, not elsewhere  classified  Difficulty in walking, not elsewhere classified  ONSET DATE: DOS 01/02/24   SUBJECTIVE:                                                                                                                                                                                           SUBJECTIVE STATEMENT: Pt reports he has not tried the knee sleeve, but plans to. He reports  he feels like his range in knee (flexion) is improving with the prone quad stretch    PERTINENT HISTORY:  Bilat feet N/T from time to time  PAIN:  Are you having pain? Yes: NPRS scale: 6/10 Pain location: Lt anterior knee Pain description: numb, tight Aggravating factors: sleeping is rough Relieving factors: movement/exercise  PRECAUTIONS:  None  RED FLAGS: None   WEIGHT BEARING RESTRICTIONS:  No  FALLS:  Has patient fallen in last 6 months? No  LIVING ENVIRONMENT: Stairs in home but has bedroom on first level  OCCUPATION:  retired  PLOF:  Independent  PATIENT GOALS:  Walking over a mile, yoga, upright stationary cycling   OBJECTIVE:  Note: Objective measures were completed at Evaluation unless otherwise noted.   PATIENT SURVEYS:  LEFS  Extreme difficulty/unable (0), Quite a bit of difficulty (1), Moderate difficulty (2), Little difficulty (3), No difficulty (4) Survey date:  02/21/24 03/12/24  Any of your usual work, housework or school activities 0 0  2. Usual hobbies, recreational or sporting activities 0 0  3. Getting into/out of the bath 1 1  4. Walking between rooms 1 2  5. Putting on socks/shoes 0 1  6. Squatting  0 0  7. Lifting an object, like a bag of groceries from the floor 1 3  8. Performing light activities around your home 2 2  9. Performing heavy activities around your home 0 0  10. Getting into/out of a car 1 2  11. Walking 2 blocks 0 1  12. Walking 1 mile 0 0  13. Going up/down 10 stairs (1 flight) 1 2  14. Standing for 1 hour 0 0  15.  sitting for 1 hour 1 1   16. Running on even ground 0 0  17. Running on uneven ground 0 0  18. Making sharp turns while running fast 0 0  19. Hopping  0 0  20. Rolling over in bed 1 2  Score total:  9/80 17/80    COGNITIVE STATUS: Within functional limits for tasks assessed   SENSATION: Anterior numbness  EDEMA:  Yes: mild as expected post op  GAIT: Comments: EVAL  rollator and cane available, lacking full knee ext at heel strike   Body Part #1 Knee  PALPATION: EVAL: limited scar/skin mobility around knee  LOWER EXTREMITY ROM:     Active  Left eval Left 02/28/24 Left  9/26  Knee flexion 84 95 AAROM 100 AAROM  Knee extension -10  -13   (Blank rows = not tested)   TREATMENT DATE:  [facility] Adult PT Treatment:                                                DATE: 03/14/24 Pt seen for aquatic therapy today.  Treatment took place in water  3.5-4.75 ft in depth at the [facility] pool. Temp of water  was 91.  Pt entered/exited the pool via stairs using step to pattern with hand rail.  *walking forward and backward with cues for heel strike and TKE * forward walking kick and high knee marching * side step into squat with blue hand floats and arm add/abdct * KTC stretch with feet in bottom and 2nd hole in ladder, UE on rails * L SLS in 4+ ft x 20s  * Rt step down, Lt retro step up x 10 (challenge)  *  STS from 3rd step in water  x 3 (challenge)  * Lt Runner's step ups onto 2nd step x 10 with light UE on R rail * at stairs: forward lunge with Lt foot on 2nd step -> hamstring stretch with pt's overpressure into extension with foot on 3rd step x 2 rounds * Lt gastroc stretch with heel off of step  Pt requires the buoyancy and hydrostatic pressure of water  for support, and to offload joints by unweighting joint load by at least 50 % in navel deep water  and by at least 75-80% in chest to neck deep water .  Viscosity of the water  is needed for resistance of strengthening. Water  current perturbations  provides challenge to standing balance requiring increased core activation.       PATIENT EDUCATION:  Education details:  exercise form/rationale Person educated: Patient Education method: Explanation, Demonstration, Tactile cues, Verbal cues,  Education comprehension: verbalized understanding, returned demonstration, verbal cues required, tactile cues required, and needs further education  HOME EXERCISE PROGRAM: Access Code: [email or web address] URL: [email or web address] Date: 02/05/2024 Prepared by: Harlene Cordon  Program Notes massage skin around knee  Exercises - Seated Hamstring Stretch  - 2-3 x daily - 7 x weekly - 2 sets - 5 breaths hold - Standing Weight Shift  - 5 x daily - 7 x weekly - Stride Stance Weight Shift  - 5 x daily - 7 x weekly - Quadruped Rocking Backward  - 2-3 x daily - 7 x weekly - 1 sets - 10 reps - Standing Gluteal Sets  - Standing Quad Set    ASSESSMENT:  CLINICAL IMPRESSION: Pt's Lt knee flexion ROM has improved, but knee extension ROM remains limited.  Encouraged pt to work equally on extension and flexion outside of therapy sessions.  Some increase in Lt knee pain with ROM exercises; eased with walking.  Will continue to progress as tolerated.        Initial Subjective Patient is a 63 y.o. M who was seen today for physical therapy evaluation and treatment for s/p Lt TKA. He is just over a month out, lacking ROM and wants to do the pool for therapy. Discussed use of aquatics and transition to land-based exercises, once he is independent in the pool, for functional workouts.  Pt is a member at [facility] so he has access to the pool. Will benefit from skilled PT to meet functional goals.     REHAB POTENTIAL: Good  CLINICAL DECISION MAKING: Stable/uncomplicated  EVALUATION COMPLEXITY: Low   GOALS: Goals reviewed with patient? Yes  SHORT TERM GOALS: Target date: 8/30  Knee ROM 0-90+ Baseline: Goal status: Partially Met  02/28/24  2.  Demo proper heel toe gait without cuing Baseline:  Goal status:Met 02/28/24    LONG TERM GOALS: Target date: POC date  Independent with long term exercise program in gym and pool Baseline:  Goal status: INITIAL  2.  Knee ROM 0-120 Baseline:  Goal status: INITIAL  3.  Able to get in/out of chairs without UE assist, without increase in pain Baseline:  Goal status: INITIAL  4.  Demo SLS controlled balance for at least 10 s on surgical LE Baseline:  Goal status: INITIAL    PLAN:  PT FREQUENCY: 1-2x/week  PT DURATION: POC date  PLANNED INTERVENTIONS: 97164- PT Re-evaluation, 97750- Physical Performance Testing, 97110-Therapeutic exercises, 97530- Therapeutic activity, [id number]- Neuromuscular re-education, 97535- Self Care, 02859- Manual therapy, [id number]- Gait training, [phone]- Aquatic Therapy, [phone] (1-2 muscles), 20561 (3+ muscles)- Dry Needling, Patient/Family education, Balance training, Stair  training, Taping, Joint mobilization, Spinal mobilization, Scar mobilization, and Cryotherapy.  PLAN FOR NEXT SESSION: continue progressive LE strengthening and Lt knee ROM.   Stairs - descending; STS without abdct of LLE.   Delon Aquas, PTA 03/14/24 11:02 AM [facility] [address] [place], [place], [address] Phone: [phone]   Fax:  [phone]

## 2024-03-19 ENCOUNTER — Encounter (HOSPITAL_BASED_OUTPATIENT_CLINIC_OR_DEPARTMENT_OTHER): Payer: Self-pay | Admitting: Physical Therapy

## 2024-03-19 ENCOUNTER — Ambulatory Visit (HOSPITAL_BASED_OUTPATIENT_CLINIC_OR_DEPARTMENT_OTHER): Admitting: Physical Therapy

## 2024-03-19 DIAGNOSIS — M25662 Stiffness of left knee, not elsewhere classified: Secondary | ICD-10-CM

## 2024-03-19 DIAGNOSIS — R262 Difficulty in walking, not elsewhere classified: Secondary | ICD-10-CM

## 2024-03-19 DIAGNOSIS — M25562 Pain in left knee: Secondary | ICD-10-CM

## 2024-03-19 NOTE — Therapy (Signed)
 OUTPATIENT PHYSICAL THERAPY TREATMENT   Patient Name: William Savage MRN: [id number] DOB:January 30, 1961, 63 y.o., male Today's Date: 03/19/2024  END OF SESSION:  PT End of Session - 03/19/24 0900     Visit Number 9    Number of Visits 16    Date for Recertification  04/01/24    Authorization Type VA- awaiting auth    Authorization Time Period 15 visit from 8/26    Authorization - Visit Number 9    Authorization - Number of Visits 15    PT Start Time 0931    PT Stop Time 1010    PT Time Calculation (min) 39 min    Activity Tolerance Patient tolerated treatment well    Behavior During Therapy [facility] for tasks assessed/performed           Past Medical History:  Diagnosis Date   Ankle instability    bilateral   Chronic kidney disease    Diabetes (HCC)    Fatty liver    Fatty liver    Hyperlipidemia    Hypertension    IBS (irritable bowel syndrome)    Left shoulder strain    Migraines    Nonobstructive atherosclerosis of coronary artery    Plantar fasciitis    PTSD (post-traumatic stress disorder)    Radiculopathy    Sleep apnea    TIA (transient ischemic attack)    Past Surgical History:  Procedure Laterality Date   BUNIONECTOMY Right    COLONOSCOPY  2021   TOTAL KNEE ARTHROPLASTY Left 01/02/2024   Procedure: ARTHROPLASTY, KNEE, TOTAL;  Surgeon: Ernie Cough, MD;  Location: [facility];  Service: Orthopedics;  Laterality: Left;   Patient Active Problem List   Diagnosis Date Noted   S/P total knee arthroplasty, left 01/02/2024   S/P total knee replacement, left 01/02/2024   Effusion, left knee 03/27/2023   Idiopathic gout of multiple sites 03/27/2023   Radiculitis 03/27/2023   Chronic low back pain 03/27/2023   Osteoarthritis of left knee 03/27/2023   Bilateral plantar fasciitis 03/27/2023    REFERRING PROVIDER:  Ernie Cough, MD     REFERRING DIAG: [id number] (ICD-10-CM) - Presence of left artificial knee joint  S/p Lt TKA  Rationale for Evaluation and  Treatment: Rehabilitation  THERAPY DIAG:  Acute pain of left knee  Stiffness of left knee, not elsewhere classified  Difficulty in walking, not elsewhere classified  ONSET DATE: DOS 01/02/24   SUBJECTIVE:  SUBJECTIVE STATEMENT: Pt reports increased LB stiffness and discomfort (maybe due to rain) 7/10; left knee 6/10.  Pt reports he is pleased his knee flex has improved.  PERTINENT HISTORY:  Bilat feet N/T from time to time  PAIN:  Are you having pain? Yes: NPRS scale: 6/10 Pain location: Lt anterior knee Pain description: numb, tight Aggravating factors: sleeping is rough Relieving factors: movement/exercise  PRECAUTIONS:  None  RED FLAGS: None   WEIGHT BEARING RESTRICTIONS:  No  FALLS:  Has patient fallen in last 6 months? No  LIVING ENVIRONMENT: Stairs in home but has bedroom on first level  OCCUPATION:  retired  PLOF:  Independent  PATIENT GOALS:  Walking over a mile, yoga, upright stationary cycling   OBJECTIVE:  Note: Objective measures were completed at Evaluation unless otherwise noted.   PATIENT SURVEYS:  LEFS  Extreme difficulty/unable (0), Quite a bit of difficulty (1), Moderate difficulty (2), Little difficulty (3), No difficulty (4) Survey date:  02/21/24 03/12/24  Any of your usual work, housework or school activities 0 0  2. Usual hobbies, recreational or sporting activities 0 0  3. Getting into/out of the bath 1 1  4. Walking between rooms 1 2  5. Putting on socks/shoes 0 1  6. Squatting  0 0  7. Lifting an object, like a bag of groceries from the floor 1 3  8. Performing light activities around your home 2 2  9. Performing heavy activities around your home 0 0  10. Getting into/out of a car 1 2  11. Walking 2 blocks 0 1  12. Walking 1 mile 0 0  13.  Going up/down 10 stairs (1 flight) 1 2  14. Standing for 1 hour 0 0  15.  sitting for 1 hour 1 1  16. Running on even ground 0 0  17. Running on uneven ground 0 0  18. Making sharp turns while running fast 0 0  19. Hopping  0 0  20. Rolling over in bed 1 2  Score total:  9/80 17/80    COGNITIVE STATUS: Within functional limits for tasks assessed   SENSATION: Anterior numbness  EDEMA:  Yes: mild as expected post op  GAIT: Comments: EVAL  rollator and cane available, lacking full knee ext at heel strike   Body Part #1 Knee  PALPATION: EVAL: limited scar/skin mobility around knee  LOWER EXTREMITY ROM:     Active  Left eval Left 02/28/24 Left  9/26  Knee flexion 84 95 AAROM 100 AAROM  Knee extension -10  -13   (Blank rows = not tested)   TREATMENT DATE:  [facility] Adult PT Treatment:                                                DATE: 03/19/24 Pt seen for aquatic therapy today.  Treatment took place in water  3.5-4.75 ft in depth at the [facility] pool. Temp of water  was 91.  Pt entered/exited the pool via stairs using step to pattern with hand rail.  *walking forward and backward wand side stepping * Lt Runner's step ups onto 2nd step x 10 with light UE on R rail * at stairs: forward lunge with Lt foot on 2nd step -> hamstring stretch with pt's overpressure into extension with foot on 3rd step x 3 rounds *L stretch * Lt gastroc stretch  with heel off of step * side step into squat with blue hand floats and arm add/abdct *forward split squat * KTC stretch with feet in bottom and 2nd hole in ladder, UE on rails  Pt requires the buoyancy and hydrostatic pressure of water  for support, and to offload joints by unweighting joint load by at least 50 % in navel deep water  and by at least 75-80% in chest to neck deep water .  Viscosity of the water  is needed for resistance of strengthening. Water  current perturbations provides challenge to standing balance requiring  increased core activation.    Left knee AAROM: 103 flex   PATIENT EDUCATION:  Education details:  exercise form/rationale Person educated: Patient Education method: Explanation, Demonstration, Tactile cues, Verbal cues,  Education comprehension: verbalized understanding, returned demonstration, verbal cues required, tactile cues required, and needs further education  HOME EXERCISE PROGRAM: Access Code: [email or web address] URL: [email or web address] Date: 02/05/2024 Prepared by: Harlene Cordon  Program Notes massage skin around knee  Exercises - Seated Hamstring Stretch  - 2-3 x daily - 7 x weekly - 2 sets - 5 breaths hold - Standing Weight Shift  - 5 x daily - 7 x weekly - Stride Stance Weight Shift  - 5 x daily - 7 x weekly - Quadruped Rocking Backward  - 2-3 x daily - 7 x weekly - 1 sets - 10 reps - Standing Gluteal Sets  - Standing Quad Set    ASSESSMENT:  CLINICAL IMPRESSION: MD appt to see surgeon 10/2.  His left knee flex has improved a few degrees but continues to be limited. He has not gotten full knee ext. He continues to put forth an excellent effort in therapy as well as on his own completing HEP consistently and daily.  Pt will transition completely back onto land as I do believe the remainder of his time is better spent there adding load to improve ROM and strength. Goals ongoing          Initial Subjective Patient is a 63 y.o. M who was seen today for physical therapy evaluation and treatment for s/p Lt TKA. He is just over a month out, lacking ROM and wants to do the pool for therapy. Discussed use of aquatics and transition to land-based exercises, once he is independent in the pool, for functional workouts.  Pt is a member at [facility] so he has access to the pool. Will benefit from skilled PT to meet functional goals.     REHAB POTENTIAL: Good  CLINICAL DECISION MAKING: Stable/uncomplicated  EVALUATION COMPLEXITY: Low   GOALS: Goals  reviewed with patient? Yes  SHORT TERM GOALS: Target date: 8/30  Knee ROM 0-90+ Baseline: Goal status: Partially Met 02/28/24; Met 03/19/24  2.  Demo proper heel toe gait without cuing Baseline:  Goal status:Met 02/28/24    LONG TERM GOALS: Target date: POC date  Independent with long term exercise program in gym and pool Baseline:  Goal status: Met 03/19/24  2.  Knee ROM 0-120 Baseline:  Goal status: In progress 03/19/24  3.  Able to get in/out of chairs without UE assist, without increase in pain Baseline:  Goal status: INITIAL  4.  Demo SLS controlled balance for at least 10 s on surgical LE Baseline:  Goal status: INITIAL    PLAN:  PT FREQUENCY: 1-2x/week  PT DURATION: POC date  PLANNED INTERVENTIONS: 97164- PT Re-evaluation, 97750- Physical Performance Testing, 97110-Therapeutic exercises, 97530- Therapeutic activity, [id number]- Neuromuscular re-education, 97535- Self Care, 02859- Manual therapy, [id number]- Gait training,  02886- Aquatic Therapy, [phone] (1-2 muscles), 20561 (3+ muscles)- Dry Needling, Patient/Family education, Balance training, Stair training, Taping, Joint mobilization, Spinal mobilization, Scar mobilization, and Cryotherapy.  PLAN FOR NEXT SESSION: continue progressive LE strengthening and Lt knee ROM.   Stairs - descending; STS without abdct of LLE.   [address] [place]) Mclain Freer MPT 03/19/24 10:14 AM [facility] [address] [place], [place], [address] Phone: [phone]   Fax:  [phone]

## 2024-03-20 ENCOUNTER — Encounter (HOSPITAL_BASED_OUTPATIENT_CLINIC_OR_DEPARTMENT_OTHER): Payer: Self-pay | Admitting: Physical Therapy

## 2024-03-20 ENCOUNTER — Ambulatory Visit (HOSPITAL_BASED_OUTPATIENT_CLINIC_OR_DEPARTMENT_OTHER): Attending: Orthopedic Surgery | Admitting: Physical Therapy

## 2024-03-20 DIAGNOSIS — M25562 Pain in left knee: Secondary | ICD-10-CM | POA: Diagnosis present

## 2024-03-20 DIAGNOSIS — R262 Difficulty in walking, not elsewhere classified: Secondary | ICD-10-CM | POA: Insufficient documentation

## 2024-03-20 DIAGNOSIS — M25662 Stiffness of left knee, not elsewhere classified: Secondary | ICD-10-CM | POA: Insufficient documentation

## 2024-03-20 DIAGNOSIS — M25561 Pain in right knee: Secondary | ICD-10-CM | POA: Diagnosis present

## 2024-03-20 DIAGNOSIS — M6281 Muscle weakness (generalized): Secondary | ICD-10-CM | POA: Diagnosis present

## 2024-03-20 DIAGNOSIS — G8929 Other chronic pain: Secondary | ICD-10-CM | POA: Insufficient documentation

## 2024-03-20 NOTE — Therapy (Signed)
 OUTPATIENT PHYSICAL THERAPY TREATMENT   Patient Name: William Savage MRN: [id number] DOB:01/21/1961, 63 y.o., male Today's Date: 03/20/2024  END OF SESSION:  PT End of Session - 03/20/24 1605     Visit Number 10    Number of Visits 16    Date for Recertification  04/01/24    Authorization Type [facility]- awaiting auth    Authorization Time Period 15 visit from 8/26    Authorization - Visit Number 10    Authorization - Number of Visits 15    PT Start Time 1605    PT Stop Time 1650    PT Time Calculation (min) 45 min    Activity Tolerance Patient tolerated treatment well    Behavior During Therapy [facility] for tasks assessed/performed           Past Medical History:  Diagnosis Date   Ankle instability    bilateral   Chronic kidney disease    Diabetes (HCC)    Fatty liver    Fatty liver    Hyperlipidemia    Hypertension    IBS (irritable bowel syndrome)    Left shoulder strain    Migraines    Nonobstructive atherosclerosis of coronary artery    Plantar fasciitis    PTSD (post-traumatic stress disorder)    Radiculopathy    Sleep apnea    TIA (transient ischemic attack)    Past Surgical History:  Procedure Laterality Date   BUNIONECTOMY Right    COLONOSCOPY  2021   TOTAL KNEE ARTHROPLASTY Left 01/02/2024   Procedure: ARTHROPLASTY, KNEE, TOTAL;  Surgeon: Ernie Cough, MD;  Location: [facility];  Service: Orthopedics;  Laterality: Left;   Patient Active Problem List   Diagnosis Date Noted   S/P total knee arthroplasty, left 01/02/2024   S/P total knee replacement, left 01/02/2024   Effusion, left knee 03/27/2023   Idiopathic gout of multiple sites 03/27/2023   Radiculitis 03/27/2023   Chronic low back pain 03/27/2023   Osteoarthritis of left knee 03/27/2023   Bilateral plantar fasciitis 03/27/2023    REFERRING PROVIDER:  Ernie Cough, MD     REFERRING DIAG: [phone] (ICD-10-CM) - Presence of left artificial knee joint  S/p Lt TKA  Rationale for Evaluation  and Treatment: Rehabilitation  THERAPY DIAG:  Acute pain of left knee  Stiffness of left knee, not elsewhere classified  Difficulty in walking, not elsewhere classified  Muscle weakness (generalized)  ONSET DATE: DOS 01/02/24   SUBJECTIVE:  SUBJECTIVE STATEMENT: Pt reports he was sore after last pool session. Pt still feels swelling is present after activity.   PERTINENT HISTORY:  Bilat feet N/T from time to time  PAIN:  Are you having pain? Yes: NPRS scale: 6/10 Pain location: Lt anterior knee Pain description: numb, tight Aggravating factors: sleeping is rough Relieving factors: movement/exercise  PRECAUTIONS:  None  RED FLAGS: None   WEIGHT BEARING RESTRICTIONS:  No  FALLS:  Has patient fallen in last 6 months? No  LIVING ENVIRONMENT: Stairs in home but has bedroom on first level  OCCUPATION:  retired  PLOF:  Independent  PATIENT GOALS:  Walking over a mile, yoga, upright stationary cycling   OBJECTIVE:  Note: Objective measures were completed at Evaluation unless otherwise noted.   PATIENT SURVEYS:  LEFS  Extreme difficulty/unable (0), Quite a bit of difficulty (1), Moderate difficulty (2), Little difficulty (3), No difficulty (4) Survey date:  02/21/24 03/12/24  Any of your usual work, housework or school activities 0 0  2. Usual hobbies, recreational or sporting activities 0 0  3. Getting into/out of the bath 1 1  4. Walking between rooms 1 2  5. Putting on socks/shoes 0 1  6. Squatting  0 0  7. Lifting an object, like a bag of groceries from the floor 1 3  8. Performing light activities around your home 2 2  9. Performing heavy activities around your home 0 0  10. Getting into/out of a car 1 2  11. Walking 2 blocks 0 1  12. Walking 1 mile 0 0  13. Going  up/down 10 stairs (1 flight) 1 2  14. Standing for 1 hour 0 0  15.  sitting for 1 hour 1 1  16. Running on even ground 0 0  17. Running on uneven ground 0 0  18. Making sharp turns while running fast 0 0  19. Hopping  0 0  20. Rolling over in bed 1 2  Score total:  9/80 17/80    COGNITIVE STATUS: Within functional limits for tasks assessed   SENSATION: Anterior numbness  EDEMA:  Yes: mild as expected post op  GAIT: Comments: EVAL  rollator and cane available, lacking full knee ext at heel strike   Body Part #1 Knee  PALPATION: EVAL: limited scar/skin mobility around knee  LOWER EXTREMITY ROM:     Active  Left eval Left 02/28/24 Left  9/26  Knee flexion 84 95 AAROM 100 AAROM  Knee extension -10  -13   (Blank rows = not tested)   TREATMENT DATE:  10/1  Recumbent bike half revs 5 min   Screw home mob grade IV Flexion mob with tibial IR grade IV in max tolerable flexion  Stair step flexion stretch 10s 10x Calf stretch with quad set heel propped 3s 2x10 RTB TKE 2x10 Midthigh height STS 2x10  103 flexion -3 ext   [facility] Adult PT Treatment:                                                DATE: 03/19/24 Pt seen for aquatic therapy today.  Treatment took place in water  3.5-4.75 ft in depth at the [facility] pool. Temp of water  was 91.  Pt entered/exited the pool via stairs using step to pattern with hand rail.  *walking forward and backward wand side stepping *  Lt Runner's step ups onto 2nd step x 10 with light UE on R rail * at stairs: forward lunge with Lt foot on 2nd step -> hamstring stretch with pt's overpressure into extension with foot on 3rd step x 3 rounds *L stretch * Lt gastroc stretch with heel off of step * side step into squat with blue hand floats and arm add/abdct *forward split squat * KTC stretch with feet in bottom and 2nd hole in ladder, UE on rails  Pt requires the buoyancy and hydrostatic pressure of water  for support, and to  offload joints by unweighting joint load by at least 50 % in navel deep water  and by at least 75-80% in chest to neck deep water .  Viscosity of the water  is needed for resistance of strengthening. Water  current perturbations provides challenge to standing balance requiring increased core activation.    Left knee AAROM: 103 flex   PATIENT EDUCATION:  Education details:  exercise form/rationale Person educated: Patient Education method: Explanation, Demonstration, Tactile cues, Verbal cues,  Education comprehension: verbalized understanding, returned demonstration, verbal cues required, tactile cues required, and needs further education  HOME EXERCISE PROGRAM: Access Code: [email or web address] URL: [email or web address] Date: 02/05/2024 Prepared by: Harlene Cordon  Program Notes massage skin around knee  Exercises - Seated Hamstring Stretch  - 2-3 x daily - 7 x weekly - 2 sets - 5 breaths hold - Standing Weight Shift  - 5 x daily - 7 x weekly - Stride Stance Weight Shift  - 5 x daily - 7 x weekly - Quadruped Rocking Backward  - 2-3 x daily - 7 x weekly - 1 sets - 10 reps - Standing Gluteal Sets  - Standing Quad Set    ASSESSMENT:  CLINICAL IMPRESSION: Pt to see MD tomorrow for f/u. Pt still presents with significant edema which may be limiting ROM. Flexion up to 103 and ext at -3. Pt did have good success with ROM following manual therapy and progressive land based exercise. Plan to continue with aggressive ROM focus, knee extension strength, and gait mechanics as pt is still limited with gait quality. Consider prone TKE, leg pressing, and SLS at future visits. Pt has transitioned fully to land at this time. Pt would benefit from continued skilled therapy in order to reach goals and maximize functional L LE strength and ROM for return to community mobility and normalized ADL.    Initial Subjective Patient is a 63 y.o. M who was seen today for physical therapy evaluation and  treatment for s/p Lt TKA. He is just over a month out, lacking ROM and wants to do the pool for therapy. Discussed use of aquatics and transition to land-based exercises, once he is independent in the pool, for functional workouts.  Pt is a member at [facility] so he has access to the pool. Will benefit from skilled PT to meet functional goals.     REHAB POTENTIAL: Good  CLINICAL DECISION MAKING: Stable/uncomplicated  EVALUATION COMPLEXITY: Low   GOALS: Goals reviewed with patient? Yes  SHORT TERM GOALS: Target date: 8/30  Knee ROM 0-90+ Baseline: Goal status: Partially Met 02/28/24; Met 03/19/24  2.  Demo proper heel toe gait without cuing Baseline:  Goal status:Met 02/28/24    LONG TERM GOALS: Target date: POC date  Independent with long term exercise program in gym and pool Baseline:  Goal status: Met 03/19/24  2.  Knee ROM 0-120 Baseline:  Goal status: In progress 03/19/24  3.  Able to get in/out of chairs  without UE assist, without increase in pain Baseline:  Goal status: INITIAL  4.  Demo SLS controlled balance for at least 10 s on surgical LE Baseline:  Goal status: INITIAL    PLAN:  PT FREQUENCY: 1-2x/week  PT DURATION: POC date  PLANNED INTERVENTIONS: 97164- PT Re-evaluation, 97750- Physical Performance Testing, 97110-Therapeutic exercises, 97530- Therapeutic activity, 97112- Neuromuscular re-education, 97535- Self Care, 02859- Manual therapy, [id number]- Gait training, [phone]- Aquatic Therapy, [phone] (1-2 muscles), 20561 (3+ muscles)- Dry Needling, Patient/Family education, Balance training, Stair training, Taping, Joint mobilization, Spinal mobilization, Scar mobilization, and Cryotherapy.  PLAN FOR NEXT SESSION: continue progressive LE strengthening and Lt knee ROM.   Stairs - descending; STS without abdct of LLE.   Dale Call PT, DPT 03/20/24 4:59 PM

## 2024-03-21 ENCOUNTER — Ambulatory Visit (HOSPITAL_BASED_OUTPATIENT_CLINIC_OR_DEPARTMENT_OTHER): Admitting: Physical Therapy

## 2024-03-25 ENCOUNTER — Ambulatory Visit (HOSPITAL_BASED_OUTPATIENT_CLINIC_OR_DEPARTMENT_OTHER): Admitting: Physical Therapy

## 2024-03-25 ENCOUNTER — Encounter (HOSPITAL_BASED_OUTPATIENT_CLINIC_OR_DEPARTMENT_OTHER): Payer: Self-pay | Admitting: Physical Therapy

## 2024-03-25 DIAGNOSIS — M6281 Muscle weakness (generalized): Secondary | ICD-10-CM

## 2024-03-25 DIAGNOSIS — M25662 Stiffness of left knee, not elsewhere classified: Secondary | ICD-10-CM

## 2024-03-25 DIAGNOSIS — R262 Difficulty in walking, not elsewhere classified: Secondary | ICD-10-CM

## 2024-03-25 DIAGNOSIS — G8929 Other chronic pain: Secondary | ICD-10-CM

## 2024-03-25 DIAGNOSIS — M25562 Pain in left knee: Secondary | ICD-10-CM | POA: Diagnosis not present

## 2024-03-25 NOTE — Therapy (Signed)
 OUTPATIENT PHYSICAL THERAPY TREATMENT   Patient Name: William Savage MRN: [id number] DOB:05/27/1961, 63 y.o., male Today's Date: 03/25/2024  END OF SESSION:  PT End of Session - 03/25/24 1205     Visit Number 11    Number of Visits 16    Date for Recertification  04/01/24    Authorization Type VA- awaiting auth    Authorization Time Period 15 visit from 8/26    Authorization - Number of Visits 16    PT Start Time 1100    PT Stop Time 1145    PT Time Calculation (min) 45 min    Activity Tolerance Patient tolerated treatment well    Behavior During Therapy WFL for tasks assessed/performed            Past Medical History:  Diagnosis Date   Ankle instability    bilateral   Chronic kidney disease    Diabetes (HCC)    Fatty liver    Fatty liver    Hyperlipidemia    Hypertension    IBS (irritable bowel syndrome)    Left shoulder strain    Migraines    Nonobstructive atherosclerosis of coronary artery    Plantar fasciitis    PTSD (post-traumatic stress disorder)    Radiculopathy    Sleep apnea    TIA (transient ischemic attack)    Past Surgical History:  Procedure Laterality Date   BUNIONECTOMY Right    COLONOSCOPY  2021   TOTAL KNEE ARTHROPLASTY Left 01/02/2024   Procedure: ARTHROPLASTY, KNEE, TOTAL;  Surgeon: Ernie Cough, MD;  Location: [facility];  Service: Orthopedics;  Laterality: Left;   Patient Active Problem List   Diagnosis Date Noted   S/P total knee arthroplasty, left 01/02/2024   S/P total knee replacement, left 01/02/2024   Effusion, left knee 03/27/2023   Idiopathic gout of multiple sites 03/27/2023   Radiculitis 03/27/2023   Chronic low back pain 03/27/2023   Osteoarthritis of left knee 03/27/2023   Bilateral plantar fasciitis 03/27/2023    REFERRING PROVIDER:  Ernie Cough, MD     REFERRING DIAG: [phone] (ICD-10-CM) - Presence of left artificial knee joint  S/p Lt TKA  Rationale for Evaluation and Treatment:  Rehabilitation  THERAPY DIAG:  Acute pain of left knee  Stiffness of left knee, not elsewhere classified  Difficulty in walking, not elsewhere classified  Muscle weakness (generalized)  Chronic pain of both knees  ONSET DATE: DOS 01/02/24   SUBJECTIVE:  SUBJECTIVE STATEMENT: I have been trying to work on my strengthening in the gym.   PERTINENT HISTORY:  Bilat feet N/T from time to time  PAIN:  Are you having pain? Yes: NPRS scale: 6/10 Pain location: Lt anterior knee Pain description: numb, tight Aggravating factors: sleeping is rough Relieving factors: movement/exercise  PRECAUTIONS:  None  RED FLAGS: None   WEIGHT BEARING RESTRICTIONS:  No  FALLS:  Has patient fallen in last 6 months? No  LIVING ENVIRONMENT: Stairs in home but has bedroom on first level  OCCUPATION:  retired  PLOF:  Independent  PATIENT GOALS:  Walking over a mile, yoga, upright stationary cycling   OBJECTIVE:  Note: Objective measures were completed at Evaluation unless otherwise noted.   PATIENT SURVEYS:  LEFS  Extreme difficulty/unable (0), Quite a bit of difficulty (1), Moderate difficulty (2), Little difficulty (3), No difficulty (4) Survey date:  02/21/24 03/12/24  Any of your usual work, housework or school activities 0 0  2. Usual hobbies, recreational or sporting activities 0 0  3. Getting into/out of the bath 1 1  4. Walking between rooms 1 2  5. Putting on socks/shoes 0 1  6. Squatting  0 0  7. Lifting an object, like a bag of groceries from the floor 1 3  8. Performing light activities around your home 2 2  9. Performing heavy activities around your home 0 0  10. Getting into/out of a car 1 2  11. Walking 2 blocks 0 1  12. Walking 1 mile 0 0  13. Going up/down 10 stairs (1 flight) 1  2  14. Standing for 1 hour 0 0  15.  sitting for 1 hour 1 1  16. Running on even ground 0 0  17. Running on uneven ground 0 0  18. Making sharp turns while running fast 0 0  19. Hopping  0 0  20. Rolling over in bed 1 2  Score total:  9/80 17/80    COGNITIVE STATUS: Within functional limits for tasks assessed   SENSATION: Anterior numbness  EDEMA:  Yes: mild as expected post op  GAIT: Comments: EVAL  rollator and cane available, lacking full knee ext at heel strike   Body Part #1 Knee  PALPATION: EVAL: limited scar/skin mobility around knee  LOWER EXTREMITY ROM:     Active  Left eval Left 02/28/24 Left  9/26  Knee flexion 84 95 AAROM 100 AAROM  Knee extension -10  -13   (Blank rows = not tested)   TREATMENT DATE:  10/06 Recumbent bike half revs 5 min Knee flexion to 100 degrees with short axis distraction  Contract relax into flexion (short sitting) and extension (supine)  TKE with black theraband Retro walking with focus on knee extension and HS stretch with wall support.  Knee flexion with towel roll under knee onto elevated surface.  Toe touch from step   10/1  Recumbent bike half revs 5 min   Screw home mob grade IV Flexion mob with tibial IR grade IV in max tolerable flexion  Stair step flexion stretch 10s 10x Calf stretch with quad set heel propped 3s 2x10 RTB TKE 2x10 Midthigh height STS 2x10  103 flexion -3 ext   [facility] Adult PT Treatment:  DATE: 03/19/24 Pt seen for aquatic therapy today.  Treatment took place in water  3.5-4.75 ft in depth at the [facility] pool. Temp of water  was 91.  Pt entered/exited the pool via stairs using step to pattern with hand rail.  *walking forward and backward wand side stepping * Lt Runner's step ups onto 2nd step x 10 with light UE on R rail * at stairs: forward lunge with Lt foot on 2nd step -> hamstring stretch with pt's overpressure into extension  with foot on 3rd step x 3 rounds *L stretch * Lt gastroc stretch with heel off of step * side step into squat with blue hand floats and arm add/abdct *forward split squat * KTC stretch with feet in bottom and 2nd hole in ladder, UE on rails  Pt requires the buoyancy and hydrostatic pressure of water  for support, and to offload joints by unweighting joint load by at least 50 % in navel deep water  and by at least 75-80% in chest to neck deep water .  Viscosity of the water  is needed for resistance of strengthening. Water  current perturbations provides challenge to standing balance requiring increased core activation.    Left knee AAROM: 103 flex   PATIENT EDUCATION:  Education details:  exercise form/rationale Person educated: Patient Education method: Explanation, Demonstration, Tactile cues, Verbal cues,  Education comprehension: verbalized understanding, returned demonstration, verbal cues required, tactile cues required, and needs further education  HOME EXERCISE PROGRAM: Access Code: [email or web address] URL: [email or web address] Date: 02/05/2024 Prepared by: Harlene Cordon  Program Notes massage skin around knee  Exercises - Seated Hamstring Stretch  - 2-3 x daily - 7 x weekly - 2 sets - 5 breaths hold - Standing Weight Shift  - 5 x daily - 7 x weekly - Stride Stance Weight Shift  - 5 x daily - 7 x weekly - Quadruped Rocking Backward  - 2-3 x daily - 7 x weekly - 1 sets - 10 reps - Standing Gluteal Sets  - Standing Quad Set    ASSESSMENT:  CLINICAL IMPRESSION: Pt is limited by ROM, he was able to reach 100 degrees in short sitting and supine today with OP. Pt lacking 15 degrees into extension. Heavy focus on manual into ROM and strengthening for quad. Plan to do leg press next session. Provided pt with HEP with focus on quad activation and ROM. Pt would benefit from continued skilled therapy in order to reach goals and maximize functional L LE strength and ROM for  return to community mobility and normalized ADL.    Initial Subjective Patient is a 63 y.o. M who was seen today for physical therapy evaluation and treatment for s/p Lt TKA. He is just over a month out, lacking ROM and wants to do the pool for therapy. Discussed use of aquatics and transition to land-based exercises, once he is independent in the pool, for functional workouts.  Pt is a member at [employer] so he has access to the pool. Will benefit from skilled PT to meet functional goals.     REHAB POTENTIAL: Good  CLINICAL DECISION MAKING: Stable/uncomplicated  EVALUATION COMPLEXITY: Low   GOALS: Goals reviewed with patient? Yes  SHORT TERM GOALS: Target date: 8/30  Knee ROM 0-90+ Baseline: Goal status: Partially Met 02/28/24; Met 03/19/24  2.  Demo proper heel toe gait without cuing Baseline:  Goal status:Met 02/28/24    LONG TERM GOALS: Target date: POC date  Independent with long term exercise program in gym and pool Baseline:  Goal status:  Met 03/19/24  2.  Knee ROM 0-120 Baseline:  Goal status: In progress 03/19/24  3.  Able to get in/out of chairs without UE assist, without increase in pain Baseline:  Goal status: INITIAL  4.  Demo SLS controlled balance for at least 10 s on surgical LE Baseline:  Goal status: INITIAL    PLAN:  PT FREQUENCY: 1-2x/week  PT DURATION: POC date  PLANNED INTERVENTIONS: 97164- PT Re-evaluation, 97750- Physical Performance Testing, 97110-Therapeutic exercises, 97530- Therapeutic activity, 97112- Neuromuscular re-education, 97535- Self Care, 02859- Manual therapy, [id number]- Gait training, [phone]- Aquatic Therapy, [phone] (1-2 muscles), 20561 (3+ muscles)- Dry Needling, Patient/Family education, Balance training, Stair training, Taping, Joint mobilization, Spinal mobilization, Scar mobilization, and Cryotherapy.  PLAN FOR NEXT SESSION: continue progressive LE strengthening and Lt knee ROM.   Stairs - descending; STS without abdct of LLE.    Rojean Batten PT, DPT 03/25/24  12:06 PM

## 2024-03-27 ENCOUNTER — Encounter (HOSPITAL_BASED_OUTPATIENT_CLINIC_OR_DEPARTMENT_OTHER): Admitting: Physical Therapy

## 2024-04-01 ENCOUNTER — Encounter (HOSPITAL_BASED_OUTPATIENT_CLINIC_OR_DEPARTMENT_OTHER): Payer: Self-pay | Admitting: Physical Therapy

## 2024-04-01 ENCOUNTER — Ambulatory Visit (HOSPITAL_BASED_OUTPATIENT_CLINIC_OR_DEPARTMENT_OTHER): Admitting: Physical Therapy

## 2024-04-01 DIAGNOSIS — G8929 Other chronic pain: Secondary | ICD-10-CM

## 2024-04-01 DIAGNOSIS — M25562 Pain in left knee: Secondary | ICD-10-CM | POA: Diagnosis not present

## 2024-04-01 DIAGNOSIS — R262 Difficulty in walking, not elsewhere classified: Secondary | ICD-10-CM

## 2024-04-01 DIAGNOSIS — M25662 Stiffness of left knee, not elsewhere classified: Secondary | ICD-10-CM

## 2024-04-01 DIAGNOSIS — M6281 Muscle weakness (generalized): Secondary | ICD-10-CM

## 2024-04-01 NOTE — Therapy (Signed)
 OUTPATIENT PHYSICAL THERAPY TREATMENT/ PROGRESS  Progress Note Reporting Period 02/05/24 to 04/01/24  See note below for Objective Data and Assessment of Progress/Goals.       Patient Name: William Savage MRN: [id number] DOB:Nov 27, 1960, 63 y.o., male Today's Date: 04/01/2024  END OF SESSION:  PT End of Session - 04/01/24 1207     Visit Number 12    Number of Visits 16    Date for Recertification  06/18/24    Authorization Type [facility]- awaiting auth    Authorization - Number of Visits 16    PT Start Time 1100    PT Stop Time 1150    PT Time Calculation (min) 50 min    Activity Tolerance Patient tolerated treatment well    Behavior During Therapy WFL for tasks assessed/performed            Past Medical History:  Diagnosis Date   Ankle instability    bilateral   Chronic kidney disease    Diabetes (HCC)    Fatty liver    Fatty liver    Hyperlipidemia    Hypertension    IBS (irritable bowel syndrome)    Left shoulder strain    Migraines    Nonobstructive atherosclerosis of coronary artery    Plantar fasciitis    PTSD (post-traumatic stress disorder)    Radiculopathy    Sleep apnea    TIA (transient ischemic attack)    Past Surgical History:  Procedure Laterality Date   BUNIONECTOMY Right    COLONOSCOPY  2021   TOTAL KNEE ARTHROPLASTY Left 01/02/2024   Procedure: ARTHROPLASTY, KNEE, TOTAL;  Surgeon: Ernie Cough, MD;  Location: [facility];  Service: Orthopedics;  Laterality: Left;   Patient Active Problem List   Diagnosis Date Noted   S/P total knee arthroplasty, left 01/02/2024   S/P total knee replacement, left 01/02/2024   Effusion, left knee 03/27/2023   Idiopathic gout of multiple sites 03/27/2023   Radiculitis 03/27/2023   Chronic low back pain 03/27/2023   Osteoarthritis of left knee 03/27/2023   Bilateral plantar fasciitis 03/27/2023    REFERRING PROVIDER:  Ernie Cough, MD     REFERRING DIAG: [phone] (ICD-10-CM) - Presence of left  artificial knee joint  S/p Lt TKA  Rationale for Evaluation and Treatment: Rehabilitation  THERAPY DIAG:  Acute pain of left knee - Plan: PT plan of care cert/re-cert  Stiffness of left knee, not elsewhere classified - Plan: PT plan of care cert/re-cert  Difficulty in walking, not elsewhere classified - Plan: PT plan of care cert/re-cert  Muscle weakness (generalized) - Plan: PT plan of care cert/re-cert  Chronic pain of both knees - Plan: PT plan of care cert/re-cert  ONSET DATE: DOS 01/02/24   SUBJECTIVE:  SUBJECTIVE STATEMENT: I have been working really hard on bending and straightening at home. It is painful beyond 95 degrees.   PERTINENT HISTORY:  Bilat feet N/T from time to time  PAIN:  Are you having pain? Yes: NPRS scale: 6/10 Pain location: Lt anterior knee Pain description: numb, tight Aggravating factors: sleeping is rough Relieving factors: movement/exercise  PRECAUTIONS:  None  RED FLAGS: None   WEIGHT BEARING RESTRICTIONS:  No  FALLS:  Has patient fallen in last 6 months? No  LIVING ENVIRONMENT: Stairs in home but has bedroom on first level  OCCUPATION:  retired  PLOF:  Independent  PATIENT GOALS:  Walking over a mile, yoga, upright stationary cycling   OBJECTIVE:  Note: Objective measures were completed at Evaluation unless otherwise noted.   PATIENT SURVEYS:  LEFS  Extreme difficulty/unable (0), Quite a bit of difficulty (1), Moderate difficulty (2), Little difficulty (3), No difficulty (4) Survey date:  02/21/24 03/12/24  Any of your usual work, housework or school activities 0 0  2. Usual hobbies, recreational or sporting activities 0 0  3. Getting into/out of the bath 1 1  4. Walking between rooms 1 2  5. Putting on socks/shoes 0 1  6. Squatting  0  0  7. Lifting an object, like a bag of groceries from the floor 1 3  8. Performing light activities around your home 2 2  9. Performing heavy activities around your home 0 0  10. Getting into/out of a car 1 2  11. Walking 2 blocks 0 1  12. Walking 1 mile 0 0  13. Going up/down 10 stairs (1 flight) 1 2  14. Standing for 1 hour 0 0  15.  sitting for 1 hour 1 1  16. Running on even ground 0 0  17. Running on uneven ground 0 0  18. Making sharp turns while running fast 0 0  19. Hopping  0 0  20. Rolling over in bed 1 2  Score total:  9/80 17/80    COGNITIVE STATUS: Within functional limits for tasks assessed   SENSATION: Anterior numbness  EDEMA:  Yes: mild as expected post op  GAIT: Comments: EVAL  rollator and cane available, lacking full knee ext at heel strike   Body Part #1 Knee  PALPATION: EVAL: limited scar/skin mobility around knee  LOWER EXTREMITY ROM:     Active  Left eval Left 02/28/24 Left  9/26 04/01/24  Knee flexion 84 95 AAROM 100 AAROM 108 AAROM  Knee extension -10  -13 -3 AAROM in supine   (Blank rows = not tested)   TREATMENT DATE:  04/01/24 Recumbent bike half revs 7 min Knee flexion to 108 degrees with short axis distraction  Contract relax into flexion (short sitting) and extension (supine)  Theragun to TFL and glutes, quad  TFL stretch Modified thomas stretch HS stretch    04/01/24 Recumbent bike half revs 7 min  10/06 Recumbent bike half revs 5 min Knee flexion to 100 degrees with short axis distraction  Contract relax into flexion (short sitting) and extension (supine)  TKE with black theraband Retro walking with focus on knee extension and HS stretch with wall support.  Knee flexion with towel roll under knee onto elevated surface.  Toe touch from step   10/1  Recumbent bike half revs 5 min   Screw home mob grade IV Flexion mob with tibial IR grade IV in max tolerable flexion  Stair step flexion stretch 10s 10x Calf  stretch  with quad set heel propped 3s 2x10 RTB TKE 2x10 Midthigh height STS 2x10  103 flexion -3 ext   [facility] Adult PT Treatment:                                                DATE: 03/19/24 Pt seen for aquatic therapy today.  Treatment took place in water  3.5-4.75 ft in depth at the [facility] pool. Temp of water  was 91.  Pt entered/exited the pool via stairs using step to pattern with hand rail.  *walking forward and backward wand side stepping * Lt Runner's step ups onto 2nd step x 10 with light UE on R rail * at stairs: forward lunge with Lt foot on 2nd step -> hamstring stretch with pt's overpressure into extension with foot on 3rd step x 3 rounds *L stretch * Lt gastroc stretch with heel off of step * side step into squat with blue hand floats and arm add/abdct *forward split squat * KTC stretch with feet in bottom and 2nd hole in ladder, UE on rails  Pt requires the buoyancy and hydrostatic pressure of water  for support, and to offload joints by unweighting joint load by at least 50 % in navel deep water  and by at least 75-80% in chest to neck deep water .  Viscosity of the water  is needed for resistance of strengthening. Water  current perturbations provides challenge to standing balance requiring increased core activation.    Left knee AAROM: 103 flex   PATIENT EDUCATION:  Education details:  exercise form/rationale Person educated: Patient Education method: Explanation, Demonstration, Tactile cues, Verbal cues,  Education comprehension: verbalized understanding, returned demonstration, verbal cues required, tactile cues required, and needs further education  HOME EXERCISE PROGRAM: Access Code: [email or web address] URL: [email or web address] Date: 02/05/2024 Prepared by: Harlene Cordon  Program Notes massage skin around knee  Exercises - Seated Hamstring Stretch  - 2-3 x daily - 7 x weekly - 2 sets - 5 breaths hold - Standing Weight Shift  - 5 x daily -  7 x weekly - Stride Stance Weight Shift  - 5 x daily - 7 x weekly - Quadruped Rocking Backward  - 2-3 x daily - 7 x weekly - 1 sets - 10 reps - Standing Gluteal Sets  - Standing Quad Set    ASSESSMENT:  CLINICAL IMPRESSION: Pt has made a lot of progress since starting PT. His ROM has improved significantly since starting reaching 108 degrees in flexion with OP, extension to -3 (lacking with OP). He walks with an antalgic gait due to limited knee mobility.  He will continue to benefit from strengthening and improved ROM to help with functional movements and gait. Session with heavy focus on manual into ROM and STM to help improve mobility. Encouraged strengthening following at the gym. Plan to do leg press next session. Pt would benefit from continued skilled therapy in order to reach goals and maximize functional L LE strength and ROM for return to community mobility and normalized ADL.    Initial Subjective Patient is a 63 y.o. M who was seen today for physical therapy evaluation and treatment for s/p Lt TKA. He is just over a month out, lacking ROM and wants to do the pool for therapy. Discussed use of aquatics and transition to land-based exercises, once he is independent in the pool, for functional workouts.  Pt is  a member at [facility] so he has access to the pool. Will benefit from skilled PT to meet functional goals.     REHAB POTENTIAL: Good  CLINICAL DECISION MAKING: Stable/uncomplicated  EVALUATION COMPLEXITY: Low   GOALS: Goals reviewed with patient? Yes  SHORT TERM GOALS: Target date: 8/30  Knee ROM 0-90+ Baseline: Goal status: Partially Met 02/28/24; Met 03/19/24  2.  Demo proper heel toe gait without cuing Baseline:  Goal status:Met 02/28/24    LONG TERM GOALS: Target date: POC date  Independent with long term exercise program in gym and pool Baseline:  Goal status: Met 03/19/24  2.  Knee ROM 0-120 Baseline:  Goal status: In progress 04/01/24  3.  Able to  get in/out of chairs without UE assist, without increase in pain Baseline:  Goal status: Ongoing 04/01/24  4.  Demo SLS controlled balance for at least 10 s on surgical LE Baseline:  Goal status: Ongoing 04/01/24    PLAN:  PT FREQUENCY: 1-2x/week  PT DURATION: POC date  PLANNED INTERVENTIONS: 97164- PT Re-evaluation, 97750- Physical Performance Testing, 97110-Therapeutic exercises, 97530- Therapeutic activity, 97112- Neuromuscular re-education, 97535- Self Care, 02859- Manual therapy, [phone]- Gait training, [phone]- Aquatic Therapy, [phone] (1-2 muscles), 20561 (3+ muscles)- Dry Needling, Patient/Family education, Balance training, Stair training, Taping, Joint mobilization, Spinal mobilization, Scar mobilization, and Cryotherapy.  PLAN FOR NEXT SESSION: continue progressive LE strengthening and Lt knee ROM.   Stairs - descending; STS without abdct of LLE.   Rojean Batten PT, DPT 04/01/24  12:19 PM

## 2024-04-03 ENCOUNTER — Encounter (HOSPITAL_BASED_OUTPATIENT_CLINIC_OR_DEPARTMENT_OTHER): Payer: Self-pay | Admitting: Physical Therapy

## 2024-04-03 ENCOUNTER — Ambulatory Visit (HOSPITAL_BASED_OUTPATIENT_CLINIC_OR_DEPARTMENT_OTHER): Admitting: Physical Therapy

## 2024-04-03 DIAGNOSIS — R262 Difficulty in walking, not elsewhere classified: Secondary | ICD-10-CM

## 2024-04-03 DIAGNOSIS — M25562 Pain in left knee: Secondary | ICD-10-CM | POA: Diagnosis not present

## 2024-04-03 DIAGNOSIS — M25662 Stiffness of left knee, not elsewhere classified: Secondary | ICD-10-CM

## 2024-04-03 DIAGNOSIS — M6281 Muscle weakness (generalized): Secondary | ICD-10-CM

## 2024-04-03 NOTE — Therapy (Signed)
 OUTPATIENT PHYSICAL THERAPY TREATMENT    Patient Name: William Savage MRN: [id number] DOB:1961-06-06, 63 y.o., male Today's Date: 04/08/2024  END OF SESSION:  PT End of Session - 04/08/24 1153     Visit Number 14    Number of Visits 16    Authorization Type [facility]- awaiting auth    Authorization Time Period 15 visit from 8/26    Authorization - Visit Number 14    Authorization - Number of Visits 16    PT Start Time 1057    PT Stop Time 1145    PT Time Calculation (min) 48 min    Activity Tolerance Patient tolerated treatment well    Behavior During Therapy WFL for tasks assessed/performed             Past Medical History:  Diagnosis Date   Ankle instability    bilateral   Chronic kidney disease    Diabetes (HCC)    Fatty liver    Fatty liver    Hyperlipidemia    Hypertension    IBS (irritable bowel syndrome)    Left shoulder strain    Migraines    Nonobstructive atherosclerosis of coronary artery    Plantar fasciitis    PTSD (post-traumatic stress disorder)    Radiculopathy    Sleep apnea    TIA (transient ischemic attack)    Past Surgical History:  Procedure Laterality Date   BUNIONECTOMY Right    COLONOSCOPY  2021   TOTAL KNEE ARTHROPLASTY Left 01/02/2024   Procedure: ARTHROPLASTY, KNEE, TOTAL;  Surgeon: Ernie Cough, MD;  Location: [facility];  Service: Orthopedics;  Laterality: Left;   Patient Active Problem List   Diagnosis Date Noted   S/P total knee arthroplasty, left 01/02/2024   S/P total knee replacement, left 01/02/2024   Effusion, left knee 03/27/2023   Idiopathic gout of multiple sites 03/27/2023   Radiculitis 03/27/2023   Chronic low back pain 03/27/2023   Osteoarthritis of left knee 03/27/2023   Bilateral plantar fasciitis 03/27/2023    REFERRING PROVIDER:  Ernie Cough, MD     REFERRING DIAG: [phone] (ICD-10-CM) - Presence of left artificial knee joint  S/p Lt TKA  Rationale for Evaluation and Treatment:  Rehabilitation  THERAPY DIAG:  Acute pain of left knee  Stiffness of left knee, not elsewhere classified  Difficulty in walking, not elsewhere classified  Muscle weakness (generalized)  ONSET DATE: DOS 01/02/24   SUBJECTIVE:  SUBJECTIVE STATEMENT: I am working really hard on my walking. I have also been walking backwards.   PERTINENT HISTORY:  Bilat feet N/T from time to time  PAIN:  Are you having pain? Yes: NPRS scale: 6/10 Pain location: Lt anterior knee Pain description: numb, tight Aggravating factors: sleeping is rough Relieving factors: movement/exercise  PRECAUTIONS:  None  RED FLAGS: None   WEIGHT BEARING RESTRICTIONS:  No  FALLS:  Has patient fallen in last 6 months? No  LIVING ENVIRONMENT: Stairs in home but has bedroom on first level  OCCUPATION:  retired  PLOF:  Independent  PATIENT GOALS:  Walking over a mile, yoga, upright stationary cycling   OBJECTIVE:  Note: Objective measures were completed at Evaluation unless otherwise noted.   PATIENT SURVEYS:  LEFS  Extreme difficulty/unable (0), Quite a bit of difficulty (1), Moderate difficulty (2), Little difficulty (3), No difficulty (4) Survey date:  02/21/24 03/12/24  Any of your usual work, housework or school activities 0 0  2. Usual hobbies, recreational or sporting activities 0 0  3. Getting into/out of the bath 1 1  4. Walking between rooms 1 2  5. Putting on socks/shoes 0 1  6. Squatting  0 0  7. Lifting an object, like a bag of groceries from the floor 1 3  8. Performing light activities around your home 2 2  9. Performing heavy activities around your home 0 0  10. Getting into/out of a car 1 2  11. Walking 2 blocks 0 1  12. Walking 1 mile 0 0  13. Going up/down 10 stairs (1 flight) 1 2  14.  Standing for 1 hour 0 0  15.  sitting for 1 hour 1 1  16. Running on even ground 0 0  17. Running on uneven ground 0 0  18. Making sharp turns while running fast 0 0  19. Hopping  0 0  20. Rolling over in bed 1 2  Score total:  9/80 17/80    COGNITIVE STATUS: Within functional limits for tasks assessed   SENSATION: Anterior numbness  EDEMA:  Yes: mild as expected post op  GAIT: Comments: EVAL  rollator and cane available, lacking full knee ext at heel strike   Body Part #1 Knee  PALPATION: EVAL: limited scar/skin mobility around knee  LOWER EXTREMITY ROM:     Active  Left eval Left 02/28/24 Left  9/26 04/01/24  Knee flexion 84 95 AAROM 100 AAROM 108 AAROM  Knee extension -10  -13 -3 AAROM in supine   (Blank rows = not tested)   TREATMENT DATE: 04/08/24: Recumbent bike half revs and hip hip with full revolutions 6 min 1 flight of stairs up/ down with alternating gait pattern 1 lap around track with focus on heel to toe gait pattern.  SL ball throws x15  Hanging from straps with rocking into flexion to tolerance.  OP into extension with contract relax - pt at -10 in neutral today.  Screw home mobs Sit to stand without UE assist x5  Discussion about potential improvements and timeline  10/15  Recumbent bike half revs 5 min Leg press 125lbs with DL Leg press 24oad SL and sidelying  Knee flexion to 112 degrees in supine Knee extension contract relax reaching -10 degrees in supine Toe touch from 6 step  TKE with purple strap  STM to adductors.   04/01/24 Recumbent bike half revs 7 min Knee flexion to 108 degrees with short axis distraction  Contract relax into  flexion (short sitting) and extension (supine)  Theragun to TFL and glutes, quad  TFL stretch Modified thomas stretch HS stretch    04/01/24 Recumbent bike half revs 7 min  10/06 Recumbent bike half revs 5 min Knee flexion to 100 degrees with short axis distraction  Contract relax into  flexion (short sitting) and extension (supine)  TKE with black theraband Retro walking with focus on knee extension and HS stretch with wall support.  Knee flexion with towel roll under knee onto elevated surface.  Toe touch from step   10/1  Recumbent bike half revs 5 min   Screw home mob grade IV Flexion mob with tibial IR grade IV in max tolerable flexion  Stair step flexion stretch 10s 10x Calf stretch with quad set heel propped 3s 2x10 RTB TKE 2x10 Midthigh height STS 2x10  103 flexion -3 ext   [facility] Adult PT Treatment:                                                DATE: 03/19/24 Pt seen for aquatic therapy today.  Treatment took place in water  3.5-4.75 ft in depth at the [facility] pool. Temp of water  was 91.  Pt entered/exited the pool via stairs using step to pattern with hand rail.  *walking forward and backward wand side stepping * Lt Runner's step ups onto 2nd step x 10 with light UE on R rail * at stairs: forward lunge with Lt foot on 2nd step -> hamstring stretch with pt's overpressure into extension with foot on 3rd step x 3 rounds *L stretch * Lt gastroc stretch with heel off of step * side step into squat with blue hand floats and arm add/abdct *forward split squat * KTC stretch with feet in bottom and 2nd hole in ladder, UE on rails  Pt requires the buoyancy and hydrostatic pressure of water  for support, and to offload joints by unweighting joint load by at least 50 % in navel deep water  and by at least 75-80% in chest to neck deep water .  Viscosity of the water  is needed for resistance of strengthening. Water  current perturbations provides challenge to standing balance requiring increased core activation.    Left knee AAROM: 103 flex   PATIENT EDUCATION:  Education details:  exercise form/rationale Person educated: Patient Education method: Explanation, Demonstration, Tactile cues, Verbal cues,  Education comprehension: verbalized understanding,  returned demonstration, verbal cues required, tactile cues required, and needs further education  HOME EXERCISE PROGRAM: Access Code: [email or web address] URL: [email or web address] Date: 02/05/2024 Prepared by: Harlene Cordon  Program Notes massage skin around knee  Exercises - Seated Hamstring Stretch  - 2-3 x daily - 7 x weekly - 2 sets - 5 breaths hold - Standing Weight Shift  - 5 x daily - 7 x weekly - Stride Stance Weight Shift  - 5 x daily - 7 x weekly - Quadruped Rocking Backward  - 2-3 x daily - 7 x weekly - 1 sets - 10 reps - Standing Gluteal Sets  - Standing Quad Set    ASSESSMENT:  CLINICAL IMPRESSION: Pt continues to show improvements in ROM. Challenged pt with stairs today with alternating gait. Discussed use of row machine and working on extension. He has functional flexion, but is limited with extension and has HS contractures. Pt plans to sit in hut tub after gym today and  utilize theragun on his HS. Patella mobs with minimal movement noted on L side compare to R. Encouraged increased strengthening within new ranges.  Pt tolerates sessions well. Pt will continue to benefit from skilled PT to address continued deficits.    Initial Subjective Patient is a 63 y.o. M who was seen today for physical therapy evaluation and treatment for s/p Lt TKA. He is just over a month out, lacking ROM and wants to do the pool for therapy. Discussed use of aquatics and transition to land-based exercises, once he is independent in the pool, for functional workouts.  Pt is a member at [employer] so he has access to the pool. Will benefit from skilled PT to meet functional goals.     REHAB POTENTIAL: Good  CLINICAL DECISION MAKING: Stable/uncomplicated  EVALUATION COMPLEXITY: Low   GOALS: Goals reviewed with patient? Yes  SHORT TERM GOALS: Target date: 8/30  Knee ROM 0-90+ Baseline: Goal status: Partially Met 02/28/24; Met 03/19/24  2.  Demo proper heel toe gait without  cuing Baseline:  Goal status:Met 02/28/24    LONG TERM GOALS: Target date: POC date  Independent with long term exercise program in gym and pool Baseline:  Goal status: Met 03/19/24  2.  Knee ROM 0-120 Baseline:  Goal status: In progress 04/01/24  3.  Able to get in/out of chairs without UE assist, without increase in pain Baseline:  Goal status: Ongoing 04/01/24  4.  Demo SLS controlled balance for at least 10 s on surgical LE Baseline:  Goal status: Ongoing 04/01/24    PLAN:  PT FREQUENCY: 1-2x/week  PT DURATION: POC date  PLANNED INTERVENTIONS: 97164- PT Re-evaluation, 97750- Physical Performance Testing, 97110-Therapeutic exercises, 97530- Therapeutic activity, 97112- Neuromuscular re-education, 97535- Self Care, 02859- Manual therapy, [phone]- Gait training, [phone]- Aquatic Therapy, [phone] (1-2 muscles), 20561 (3+ muscles)- Dry Needling, Patient/Family education, Balance training, Stair training, Taping, Joint mobilization, Spinal mobilization, Scar mobilization, and Cryotherapy.  PLAN FOR NEXT SESSION: continue progressive LE strengthening and Lt knee ROM.   Stairs - descending; STS without abdct of LLE.   Rojean Batten PT, DPT 04/08/24  12:01 PM

## 2024-04-03 NOTE — Therapy (Signed)
 OUTPATIENT PHYSICAL THERAPY TREATMENT/ PROGRESS  Progress Note Reporting Period 02/05/24 to 04/01/24  See note below for Objective Data and Assessment of Progress/Goals.       Patient Name: William Savage MRN: [id number] DOB:04/17/1961, 63 y.o., male Today's Date: 04/03/2024  END OF SESSION:  PT End of Session - 04/03/24 1028     Visit Number 13    Number of Visits 16    Date for Recertification  06/18/24    Authorization Type VA- awaiting auth    Authorization Time Period 15 visit from 8/26    Authorization - Number of Visits 16    PT Start Time 1012    PT Stop Time 1055    PT Time Calculation (min) 43 min    Activity Tolerance Patient tolerated treatment well    Behavior During Therapy WFL for tasks assessed/performed            Past Medical History:  Diagnosis Date   Ankle instability    bilateral   Chronic kidney disease    Diabetes (HCC)    Fatty liver    Fatty liver    Hyperlipidemia    Hypertension    IBS (irritable bowel syndrome)    Left shoulder strain    Migraines    Nonobstructive atherosclerosis of coronary artery    Plantar fasciitis    PTSD (post-traumatic stress disorder)    Radiculopathy    Sleep apnea    TIA (transient ischemic attack)    Past Surgical History:  Procedure Laterality Date   BUNIONECTOMY Right    COLONOSCOPY  2021   TOTAL KNEE ARTHROPLASTY Left 01/02/2024   Procedure: ARTHROPLASTY, KNEE, TOTAL;  Surgeon: Ernie Cough, MD;  Location: [facility];  Service: Orthopedics;  Laterality: Left;   Patient Active Problem List   Diagnosis Date Noted   S/P total knee arthroplasty, left 01/02/2024   S/P total knee replacement, left 01/02/2024   Effusion, left knee 03/27/2023   Idiopathic gout of multiple sites 03/27/2023   Radiculitis 03/27/2023   Chronic low back pain 03/27/2023   Osteoarthritis of left knee 03/27/2023   Bilateral plantar fasciitis 03/27/2023    REFERRING PROVIDER:  Ernie Cough, MD     REFERRING  DIAG: [phone] (ICD-10-CM) - Presence of left artificial knee joint  S/p Lt TKA  Rationale for Evaluation and Treatment: Rehabilitation  THERAPY DIAG:  Acute pain of left knee  Stiffness of left knee, not elsewhere classified  Difficulty in walking, not elsewhere classified  Muscle weakness (generalized)  ONSET DATE: DOS 01/02/24   SUBJECTIVE:  SUBJECTIVE STATEMENT: I have been working really hard on bending and straightening at home. It is painful beyond 95 degrees.   PERTINENT HISTORY:  Bilat feet N/T from time to time  PAIN:  Are you having pain? Yes: NPRS scale: 6/10 Pain location: Lt anterior knee Pain description: numb, tight Aggravating factors: sleeping is rough Relieving factors: movement/exercise  PRECAUTIONS:  None  RED FLAGS: None   WEIGHT BEARING RESTRICTIONS:  No  FALLS:  Has patient fallen in last 6 months? No  LIVING ENVIRONMENT: Stairs in home but has bedroom on first level  OCCUPATION:  retired  PLOF:  Independent  PATIENT GOALS:  Walking over a mile, yoga, upright stationary cycling   OBJECTIVE:  Note: Objective measures were completed at Evaluation unless otherwise noted.   PATIENT SURVEYS:  LEFS  Extreme difficulty/unable (0), Quite a bit of difficulty (1), Moderate difficulty (2), Little difficulty (3), No difficulty (4) Survey date:  02/21/24 03/12/24  Any of your usual work, housework or school activities 0 0  2. Usual hobbies, recreational or sporting activities 0 0  3. Getting into/out of the bath 1 1  4. Walking between rooms 1 2  5. Putting on socks/shoes 0 1  6. Squatting  0 0  7. Lifting an object, like a bag of groceries from the floor 1 3  8. Performing light activities around your home 2 2  9. Performing heavy activities around your  home 0 0  10. Getting into/out of a car 1 2  11. Walking 2 blocks 0 1  12. Walking 1 mile 0 0  13. Going up/down 10 stairs (1 flight) 1 2  14. Standing for 1 hour 0 0  15.  sitting for 1 hour 1 1  16. Running on even ground 0 0  17. Running on uneven ground 0 0  18. Making sharp turns while running fast 0 0  19. Hopping  0 0  20. Rolling over in bed 1 2  Score total:  9/80 17/80    COGNITIVE STATUS: Within functional limits for tasks assessed   SENSATION: Anterior numbness  EDEMA:  Yes: mild as expected post op  GAIT: Comments: EVAL  rollator and cane available, lacking full knee ext at heel strike   Body Part #1 Knee  PALPATION: EVAL: limited scar/skin mobility around knee  LOWER EXTREMITY ROM:     Active  Left eval Left 02/28/24 Left  9/26 04/01/24  Knee flexion 84 95 AAROM 100 AAROM 108 AAROM  Knee extension -10  -13 -3 AAROM in supine   (Blank rows = not tested)   TREATMENT DATE: 10/15  Recumbent bike half revs 5 min Leg press 125lbs with DL Leg press 24oad SL and sidelying  Knee flexion to 112 degrees in supine Knee extension contract relax reaching -10 degrees in supine Toe touch from 6 step  TKE with purple strap  STM to adductors.   04/01/24 Recumbent bike half revs 7 min Knee flexion to 108 degrees with short axis distraction  Contract relax into flexion (short sitting) and extension (supine)  Theragun to TFL and glutes, quad  TFL stretch Modified thomas stretch HS stretch    04/01/24 Recumbent bike half revs 7 min  10/06 Recumbent bike half revs 5 min Knee flexion to 100 degrees with short axis distraction  Contract relax into flexion (short sitting) and extension (supine)  TKE with black theraband Retro walking with focus on knee extension and HS stretch with wall support.  Knee flexion  with towel roll under knee onto elevated surface.  Toe touch from step   10/1  Recumbent bike half revs 5 min   Screw home mob grade  IV Flexion mob with tibial IR grade IV in max tolerable flexion  Stair step flexion stretch 10s 10x Calf stretch with quad set heel propped 3s 2x10 RTB TKE 2x10 Midthigh height STS 2x10  103 flexion -3 ext   [facility] Adult PT Treatment:                                                DATE: 03/19/24 Pt seen for aquatic therapy today.  Treatment took place in water  3.5-4.75 ft in depth at the [facility] pool. Temp of water  was 91.  Pt entered/exited the pool via stairs using step to pattern with hand rail.  *walking forward and backward wand side stepping * Lt Runner's step ups onto 2nd step x 10 with light UE on R rail * at stairs: forward lunge with Lt foot on 2nd step -> hamstring stretch with pt's overpressure into extension with foot on 3rd step x 3 rounds *L stretch * Lt gastroc stretch with heel off of step * side step into squat with blue hand floats and arm add/abdct *forward split squat * KTC stretch with feet in bottom and 2nd hole in ladder, UE on rails  Pt requires the buoyancy and hydrostatic pressure of water  for support, and to offload joints by unweighting joint load by at least 50 % in navel deep water  and by at least 75-80% in chest to neck deep water .  Viscosity of the water  is needed for resistance of strengthening. Water  current perturbations provides challenge to standing balance requiring increased core activation.    Left knee AAROM: 103 flex   PATIENT EDUCATION:  Education details:  exercise form/rationale Person educated: Patient Education method: Explanation, Demonstration, Tactile cues, Verbal cues,  Education comprehension: verbalized understanding, returned demonstration, verbal cues required, tactile cues required, and needs further education  HOME EXERCISE PROGRAM: Access Code: [email or web address] URL: [email or web address] Date: 02/05/2024 Prepared by: Harlene Cordon  Program Notes massage skin around knee  Exercises - Seated  Hamstring Stretch  - 2-3 x daily - 7 x weekly - 2 sets - 5 breaths hold - Standing Weight Shift  - 5 x daily - 7 x weekly - Stride Stance Weight Shift  - 5 x daily - 7 x weekly - Quadruped Rocking Backward  - 2-3 x daily - 7 x weekly - 1 sets - 10 reps - Standing Gluteal Sets  - Standing Quad Set    ASSESSMENT:  CLINICAL IMPRESSION: Pt continues to show improvements in ROM with OP. He is reaching 112 flexion and lacking 10 degrees today into extension. He is motivated and work on ROM outside of PT at [employer]. Encouraged increased strengthening within new ranges. Pt also encouraged to utilize jets in hottub to help minimize muscle tension. Pt tolerates sessions well. Pt will continue to benefit from skilled PT to address continued deficits.    Initial Subjective Patient is a 63 y.o. M who was seen today for physical therapy evaluation and treatment for s/p Lt TKA. He is just over a month out, lacking ROM and wants to do the pool for therapy. Discussed use of aquatics and transition to land-based exercises, once he is independent in the pool, for  functional workouts.  Pt is a member at [facility] so he has access to the pool. Will benefit from skilled PT to meet functional goals.     REHAB POTENTIAL: Good  CLINICAL DECISION MAKING: Stable/uncomplicated  EVALUATION COMPLEXITY: Low   GOALS: Goals reviewed with patient? Yes  SHORT TERM GOALS: Target date: 8/30  Knee ROM 0-90+ Baseline: Goal status: Partially Met 02/28/24; Met 03/19/24  2.  Demo proper heel toe gait without cuing Baseline:  Goal status:Met 02/28/24    LONG TERM GOALS: Target date: POC date  Independent with long term exercise program in gym and pool Baseline:  Goal status: Met 03/19/24  2.  Knee ROM 0-120 Baseline:  Goal status: In progress 04/01/24  3.  Able to get in/out of chairs without UE assist, without increase in pain Baseline:  Goal status: Ongoing 04/01/24  4.  Demo SLS controlled balance for at  least 10 s on surgical LE Baseline:  Goal status: Ongoing 04/01/24    PLAN:  PT FREQUENCY: 1-2x/week  PT DURATION: POC date  PLANNED INTERVENTIONS: 97164- PT Re-evaluation, 97750- Physical Performance Testing, 97110-Therapeutic exercises, 97530- Therapeutic activity, 97112- Neuromuscular re-education, 97535- Self Care, 02859- Manual therapy, [phone]- Gait training, [phone]- Aquatic Therapy, [phone] (1-2 muscles), 20561 (3+ muscles)- Dry Needling, Patient/Family education, Balance training, Stair training, Taping, Joint mobilization, Spinal mobilization, Scar mobilization, and Cryotherapy.  PLAN FOR NEXT SESSION: continue progressive LE strengthening and Lt knee ROM.   Stairs - descending; STS without abdct of LLE.   Rojean Batten PT, DPT 04/03/24  11:02 AM

## 2024-04-08 ENCOUNTER — Encounter (HOSPITAL_BASED_OUTPATIENT_CLINIC_OR_DEPARTMENT_OTHER): Payer: Self-pay | Admitting: Physical Therapy

## 2024-04-08 ENCOUNTER — Ambulatory Visit (HOSPITAL_BASED_OUTPATIENT_CLINIC_OR_DEPARTMENT_OTHER): Admitting: Physical Therapy

## 2024-04-08 DIAGNOSIS — M25662 Stiffness of left knee, not elsewhere classified: Secondary | ICD-10-CM

## 2024-04-08 DIAGNOSIS — M25562 Pain in left knee: Secondary | ICD-10-CM

## 2024-04-08 DIAGNOSIS — R262 Difficulty in walking, not elsewhere classified: Secondary | ICD-10-CM

## 2024-04-08 DIAGNOSIS — M6281 Muscle weakness (generalized): Secondary | ICD-10-CM

## 2024-04-10 ENCOUNTER — Ambulatory Visit (HOSPITAL_BASED_OUTPATIENT_CLINIC_OR_DEPARTMENT_OTHER): Admitting: Physical Therapy

## 2024-04-10 ENCOUNTER — Encounter (HOSPITAL_BASED_OUTPATIENT_CLINIC_OR_DEPARTMENT_OTHER): Payer: Self-pay | Admitting: Physical Therapy

## 2024-04-10 DIAGNOSIS — R262 Difficulty in walking, not elsewhere classified: Secondary | ICD-10-CM

## 2024-04-10 DIAGNOSIS — M25662 Stiffness of left knee, not elsewhere classified: Secondary | ICD-10-CM

## 2024-04-10 DIAGNOSIS — M25562 Pain in left knee: Secondary | ICD-10-CM

## 2024-04-10 DIAGNOSIS — M6281 Muscle weakness (generalized): Secondary | ICD-10-CM

## 2024-04-10 NOTE — Therapy (Addendum)
 OUTPATIENT PHYSICAL THERAPY TREATMENT    Patient Name: William Savage MRN: [id number] DOB:Nov 16, 1960, 63 y.o., male Today's Date: 04/10/2024  END OF SESSION:  PT End of Session - 04/10/24 1101     Visit Number 15    Number of Visits 16    Date for Recertification  06/18/24    Authorization Type VA- awaiting auth    Authorization Time Period 15 visit from 8/26    Authorization - Visit Number 15    Authorization - Number of Visits 16    PT Start Time 1101    PT Stop Time 1151    PT Time Calculation (min) 50 min    Activity Tolerance Patient tolerated treatment well    Behavior During Therapy WFL for tasks assessed/performed          Past Medical History:  Diagnosis Date   Ankle instability    bilateral   Chronic kidney disease    Diabetes (HCC)    Fatty liver    Fatty liver    Hyperlipidemia    Hypertension    IBS (irritable bowel syndrome)    Left shoulder strain    Migraines    Nonobstructive atherosclerosis of coronary artery    Plantar fasciitis    PTSD (post-traumatic stress disorder)    Radiculopathy    Sleep apnea    TIA (transient ischemic attack)    Past Surgical History:  Procedure Laterality Date   BUNIONECTOMY Right    COLONOSCOPY  2021   TOTAL KNEE ARTHROPLASTY Left 01/02/2024   Procedure: ARTHROPLASTY, KNEE, TOTAL;  Surgeon: Ernie Cough, MD;  Location: [facility];  Service: Orthopedics;  Laterality: Left;   Patient Active Problem List   Diagnosis Date Noted   S/P total knee arthroplasty, left 01/02/2024   S/P total knee replacement, left 01/02/2024   Effusion, left knee 03/27/2023   Idiopathic gout of multiple sites 03/27/2023   Radiculitis 03/27/2023   Chronic low back pain 03/27/2023   Osteoarthritis of left knee 03/27/2023   Bilateral plantar fasciitis 03/27/2023   REFERRING PROVIDER:  Ernie Cough, MD   REFERRING DIAG: [id number] (ICD-10-CM) - Presence of left artificial knee joint  S/p Lt TKA  Rationale for Evaluation and  Treatment: Rehabilitation  THERAPY DIAG:  Acute pain of left knee  Stiffness of left knee, not elsewhere classified  Difficulty in walking, not elsewhere classified  Muscle weakness (generalized)  ONSET DATE: DOS 01/02/24   SUBJECTIVE:  SUBJECTIVE STATEMENT: I am working really hard on my walking. I have also been walking backwards.   PERTINENT HISTORY:  Bilat feet N/T from time to time  PAIN:  Are you having pain? Yes: NPRS scale: 6/10 Pain location: Lt anterior knee Pain description: numb, tight Aggravating factors: sleeping is rough Relieving factors: movement/exercise  PRECAUTIONS:  None  RED FLAGS: None   WEIGHT BEARING RESTRICTIONS:  No  FALLS:  Has patient fallen in last 6 months? No  LIVING ENVIRONMENT: Stairs in home but has bedroom on first level  OCCUPATION:  retired  PLOF:  Independent  PATIENT GOALS:  Walking over a mile, yoga, upright stationary cycling   OBJECTIVE:  Note: Objective measures were completed at Evaluation unless otherwise noted.   PATIENT SURVEYS:  LEFS  Extreme difficulty/unable (0), Quite a bit of difficulty (1), Moderate difficulty (2), Little difficulty (3), No difficulty (4) Survey date:  02/21/24 03/12/24  Any of your usual work, housework or school activities 0 0  2. Usual hobbies, recreational or sporting activities 0 0  3. Getting into/out of the bath 1 1  4. Walking between rooms 1 2  5. Putting on socks/shoes 0 1  6. Squatting  0 0  7. Lifting an object, like a bag of groceries from the floor 1 3  8. Performing light activities around your home 2 2  9. Performing heavy activities around your home 0 0  10. Getting into/out of a car 1 2  11. Walking 2 blocks 0 1  12. Walking 1 mile 0 0  13. Going up/down 10 stairs (1 flight) 1 2   14. Standing for 1 hour 0 0  15.  sitting for 1 hour 1 1  16. Running on even ground 0 0  17. Running on uneven ground 0 0  18. Making sharp turns while running fast 0 0  19. Hopping  0 0  20. Rolling over in bed 1 2  Score total:  9/80 17/80    COGNITIVE STATUS: Within functional limits for tasks assessed   SENSATION: Anterior numbness  EDEMA:  Yes: mild as expected post op  GAIT: Comments: EVAL  rollator and cane available, lacking full knee ext at heel strike   Body Part #1 Knee  PALPATION: EVAL: limited scar/skin mobility around knee  LOWER EXTREMITY ROM:     Active  Left eval Left 02/28/24 Left  9/26 04/01/24 04/10/24  Knee flexion 84 95 AAROM 100 AAROM 108 AAROM 90 101 105  Knee extension -10  -13 -3 AAROM in supine -10 before edema massage  -7 after   (Blank rows = not tested)   TREATMENT DATE: 04/10/24: Recumbent bike half revs and hip hike with full revolutions 6 min Edema massage  Long axis distraction to knee with quad sets x10 AP mobilization with ER/ screw home mobilizations Contract/relax and hold/relax techniques seated/prone Prone quad set  with contralateral overpressure x5  04/08/24: Recumbent bike half revs and hip hip with full revolutions 6 min 1 flight of stairs up/ down with alternating gait pattern 1 lap around track with focus on heel to toe gait pattern.  SL ball throws x15  Hanging from straps with rocking into flexion to tolerance.  OP into extension with contract relax - pt at -10 in neutral today.  Screw home mobs Sit to stand without UE assist x5  Discussion about potential improvements and timeline  10/15  Recumbent bike half revs 5 min Leg press 125lbs with DL Leg press  75lbs SL and sidelying  Knee flexion to 112 degrees in supine Knee extension contract relax reaching -10 degrees in supine Toe touch from 6 step  TKE with purple strap  STM to adductors.   04/01/24 Recumbent bike half revs 7 min Knee  flexion to 108 degrees with short axis distraction  Contract relax into flexion (short sitting) and extension (supine)  Theragun to TFL and glutes, quad  TFL stretch Modified thomas stretch HS stretch    04/01/24 Recumbent bike half revs 7 min  10/06 Recumbent bike half revs 5 min Knee flexion to 100 degrees with short axis distraction  Contract relax into flexion (short sitting) and extension (supine)  TKE with black theraband Retro walking with focus on knee extension and HS stretch with wall support.  Knee flexion with towel roll under knee onto elevated surface.  Toe touch from step   10/1  Recumbent bike half revs 5 min   Screw home mob grade IV Flexion mob with tibial IR grade IV in max tolerable flexion  Stair step flexion stretch 10s 10x Calf stretch with quad set heel propped 3s 2x10 RTB TKE 2x10 Midthigh height STS 2x10  103 flexion -3 ext   [facility] Adult PT Treatment:                                                DATE: 03/19/24 Pt seen for aquatic therapy today.  Treatment took place in water  3.5-4.75 ft in depth at the [facility] pool. Temp of water  was 91.  Pt entered/exited the pool via stairs using step to pattern with hand rail.  *walking forward and backward wand side stepping * Lt Runner's step ups onto 2nd step x 10 with light UE on R rail * at stairs: forward lunge with Lt foot on 2nd step -> hamstring stretch with pt's overpressure into extension with foot on 3rd step x 3 rounds *L stretch * Lt gastroc stretch with heel off of step * side step into squat with blue hand floats and arm add/abdct *forward split squat * KTC stretch with feet in bottom and 2nd hole in ladder, UE on rails  Pt requires the buoyancy and hydrostatic pressure of water  for support, and to offload joints by unweighting joint load by at least 50 % in navel deep water  and by at least 75-80% in chest to neck deep water .  Viscosity of the water  is needed for resistance of  strengthening. Water  current perturbations provides challenge to standing balance requiring increased core activation.    Left knee AAROM: 103 flex   PATIENT EDUCATION:  Education details:  exercise form/rationale Person educated: Patient Education method: Explanation, Demonstration, Tactile cues, Verbal cues,  Education comprehension: verbalized understanding, returned demonstration, verbal cues required, tactile cues required, and needs further education  HOME EXERCISE PROGRAM: Access Code: [email or web address] URL: [email or web address] Date: 02/05/2024 Prepared by: Harlene Cordon  Program Notes massage skin around knee  Exercises - Seated Hamstring Stretch  - 2-3 x daily - 7 x weekly - 2 sets - 5 breaths hold - Standing Weight Shift  - 5 x daily - 7 x weekly - Stride Stance Weight Shift  - 5 x daily - 7 x weekly - Quadruped Rocking Backward  - 2-3 x daily - 7 x weekly - 1 sets - 10 reps - Standing Gluteal Sets  - Standing  Quad Set    ASSESSMENT:  CLINICAL IMPRESSION: Tolerated exercises well. Treatment focused on manual therapy to decrease edema and improve knee extension ROM. Exercises focused on neuromuscular facilitation to decrease guarding in quads/hamstrings to imrpove knee ROM. Educated on relevant anatomy and activity modifications. Will continue to benefit from therapy to address remaining limitations and improve to PLOF.    Initial Subjective Patient is a 63 y.o. M who was seen today for physical therapy evaluation and treatment for s/p Lt TKA. He is just over a month out, lacking ROM and wants to do the pool for therapy. Discussed use of aquatics and transition to land-based exercises, once he is independent in the pool, for functional workouts.  Pt is a member at [facility] so he has access to the pool. Will benefit from skilled PT to meet functional goals.     REHAB POTENTIAL: Good  CLINICAL DECISION MAKING: Stable/uncomplicated  EVALUATION COMPLEXITY:  Low   GOALS: Goals reviewed with patient? Yes  SHORT TERM GOALS: Target date: 8/30  Knee ROM 0-90+ Baseline: Goal status: Partially Met 02/28/24; Met 03/19/24  2.  Demo proper heel toe gait without cuing Baseline:  Goal status:Met 02/28/24    LONG TERM GOALS: Target date: POC date  Independent with long term exercise program in gym and pool Baseline:  Goal status: Met 03/19/24  2.  Knee ROM 0-120 Baseline:  Goal status: In progress 04/01/24  3.  Able to get in/out of chairs without UE assist, without increase in pain Baseline:  Goal status: Ongoing 04/01/24  4.  Demo SLS controlled balance for at least 10 s on surgical LE Baseline:  Goal status: Ongoing 04/01/24  PLAN:  PT FREQUENCY: 1-2x/week  PT DURATION: POC date  PLANNED INTERVENTIONS: 97164- PT Re-evaluation, 97750- Physical Performance Testing, 97110-Therapeutic exercises, 97530- Therapeutic activity, 97112- Neuromuscular re-education, 97535- Self Care, 02859- Manual therapy, [phone]- Gait training, [phone]- Aquatic Therapy, [phone] (1-2 muscles), 20561 (3+ muscles)- Dry Needling, Patient/Family education, Balance training, Stair training, Taping, Joint mobilization, Spinal mobilization, Scar mobilization, and Cryotherapy.  PLAN FOR NEXT SESSION: continue progressive LE strengthening and Lt knee ROM.   Stairs - descending; STS without abdct of LLE.    Lili Finder, Student-PT 04/10/2024, 11:51 AM   This entire session was performed under direct supervision and direction of a licensed therapist/therapist assistant . I have personally read, edited and approve of the note as written. 11:58 AM, 04/10/24 Prentice CANDIE Stains PT, DPT Physical Therapist at [facility]

## 2024-04-15 ENCOUNTER — Encounter (HOSPITAL_BASED_OUTPATIENT_CLINIC_OR_DEPARTMENT_OTHER): Payer: Self-pay | Admitting: Physical Therapy

## 2024-04-15 ENCOUNTER — Ambulatory Visit (HOSPITAL_BASED_OUTPATIENT_CLINIC_OR_DEPARTMENT_OTHER): Admitting: Physical Therapy

## 2024-04-15 DIAGNOSIS — R262 Difficulty in walking, not elsewhere classified: Secondary | ICD-10-CM

## 2024-04-15 DIAGNOSIS — M25562 Pain in left knee: Secondary | ICD-10-CM | POA: Diagnosis not present

## 2024-04-15 DIAGNOSIS — M6281 Muscle weakness (generalized): Secondary | ICD-10-CM

## 2024-04-15 DIAGNOSIS — M25662 Stiffness of left knee, not elsewhere classified: Secondary | ICD-10-CM

## 2024-04-15 NOTE — Therapy (Signed)
 OUTPATIENT PHYSICAL THERAPY TREATMENT    Patient Name: William Savage MRN: [id number] DOB:07-01-1960, 63 y.o., male Today's Date: 04/15/2024  END OF SESSION:  PT End of Session - 04/15/24 1234     Visit Number 16    Number of Visits 16    Date for Recertification  06/04/24    Authorization Type [facility]- awaiting auth    Authorization Time Period 15 visit from 8/26    Authorization - Visit Number 16    Authorization - Number of Visits 16    PT Start Time 1100    PT Stop Time 1145    PT Time Calculation (min) 45 min    Activity Tolerance Patient tolerated treatment well    Behavior During Therapy WFL for tasks assessed/performed           Past Medical History:  Diagnosis Date   Ankle instability    bilateral   Chronic kidney disease    Diabetes (HCC)    Fatty liver    Fatty liver    Hyperlipidemia    Hypertension    IBS (irritable bowel syndrome)    Left shoulder strain    Migraines    Nonobstructive atherosclerosis of coronary artery    Plantar fasciitis    PTSD (post-traumatic stress disorder)    Radiculopathy    Sleep apnea    TIA (transient ischemic attack)    Past Surgical History:  Procedure Laterality Date   BUNIONECTOMY Right    COLONOSCOPY  2021   TOTAL KNEE ARTHROPLASTY Left 01/02/2024   Procedure: ARTHROPLASTY, KNEE, TOTAL;  Surgeon: Ernie Cough, MD;  Location: [facility];  Service: Orthopedics;  Laterality: Left;   Patient Active Problem List   Diagnosis Date Noted   S/P total knee arthroplasty, left 01/02/2024   S/P total knee replacement, left 01/02/2024   Effusion, left knee 03/27/2023   Idiopathic gout of multiple sites 03/27/2023   Radiculitis 03/27/2023   Chronic low back pain 03/27/2023   Osteoarthritis of left knee 03/27/2023   Bilateral plantar fasciitis 03/27/2023   REFERRING PROVIDER:  Ernie Cough, MD   REFERRING DIAG: [phone] (ICD-10-CM) - Presence of left artificial knee joint  S/p Lt TKA  Rationale for Evaluation and  Treatment: Rehabilitation  THERAPY DIAG:  Acute pain of left knee  Stiffness of left knee, not elsewhere classified  Difficulty in walking, not elsewhere classified  Muscle weakness (generalized)  ONSET DATE: DOS 01/02/24   SUBJECTIVE:  SUBJECTIVE STATEMENT: I will do my research on Dry needling and get back to you next session.   PERTINENT HISTORY:  Bilat feet N/T from time to time  PAIN:  Are you having pain? Yes: NPRS scale: 6/10 Pain location: Lt anterior knee Pain description: numb, tight Aggravating factors: sleeping is rough Relieving factors: movement/exercise  PRECAUTIONS:  None  RED FLAGS: None   WEIGHT BEARING RESTRICTIONS:  No  FALLS:  Has patient fallen in last 6 months? No  LIVING ENVIRONMENT: Stairs in home but has bedroom on first level  OCCUPATION:  retired  PLOF:  Independent  PATIENT GOALS:  Walking over a mile, yoga, upright stationary cycling   OBJECTIVE:  Note: Objective measures were completed at Evaluation unless otherwise noted.   PATIENT SURVEYS:  LEFS  Extreme difficulty/unable (0), Quite a bit of difficulty (1), Moderate difficulty (2), Little difficulty (3), No difficulty (4) Survey date:  02/21/24 03/12/24  Any of your usual work, housework or school activities 0 0  2. Usual hobbies, recreational or sporting activities 0 0  3. Getting into/out of the bath 1 1  4. Walking between rooms 1 2  5. Putting on socks/shoes 0 1  6. Squatting  0 0  7. Lifting an object, like a bag of groceries from the floor 1 3  8. Performing light activities around your home 2 2  9. Performing heavy activities around your home 0 0  10. Getting into/out of a car 1 2  11. Walking 2 blocks 0 1  12. Walking 1 mile 0 0  13. Going up/down 10 stairs (1 flight) 1 2   14. Standing for 1 hour 0 0  15.  sitting for 1 hour 1 1  16. Running on even ground 0 0  17. Running on uneven ground 0 0  18. Making sharp turns while running fast 0 0  19. Hopping  0 0  20. Rolling over in bed 1 2  Score total:  9/80 17/80    COGNITIVE STATUS: Within functional limits for tasks assessed   SENSATION: Anterior numbness  EDEMA:  Yes: mild as expected post op  GAIT: Comments: EVAL  rollator and cane available, lacking full knee ext at heel strike   Body Part #1 Knee  PALPATION: EVAL: limited scar/skin mobility around knee  LOWER EXTREMITY ROM:     Active  Left eval Left 02/28/24 Left  9/26 04/01/24 04/10/24  Knee flexion 84 95 AAROM 100 AAROM 108 AAROM 90 101 105  Knee extension -10  -13 -3 AAROM in supine -10 before edema massage  -7 after   (Blank rows = not tested)   TREATMENT DATE: 04/15/24 Recumbent bike half revs and hip hike with full revolutions 6 min Long axis distraction to knee with contract relax into flexion.  Patella mobs with movement.  IASTM to HS, and quads Step up and down off 6 step with cues for proper mechanics  - Test/ retest following IASTM Active walking between exercises to assess symptoms.    04/10/24: Recumbent bike half revs and hip hike with full revolutions 6 min Edema massage  Long axis distraction to knee with quad sets x10 AP mobilization with ER/ screw home mobilizations Contract/relax and hold/relax techniques seated/prone Prone quad set  with contralateral overpressure x5  04/08/24: Recumbent bike half revs and hip hip with full revolutions 6 min 1 flight of stairs up/ down with alternating gait pattern 1 lap around track with focus on heel to toe gait pattern.  SL ball throws x15  Hanging from straps with rocking into flexion to tolerance.  OP into extension with contract relax - pt at -10 in neutral today.  Screw home mobs Sit to stand without UE assist x5  Discussion about potential  improvements and timeline  10/15  Recumbent bike half revs 5 min Leg press 125lbs with DL Leg press 24oad SL and sidelying  Knee flexion to 112 degrees in supine Knee extension contract relax reaching -10 degrees in supine Toe touch from 6 step  TKE with purple strap  STM to adductors.   04/01/24 Recumbent bike half revs 7 min Knee flexion to 108 degrees with short axis distraction  Contract relax into flexion (short sitting) and extension (supine)  Theragun to TFL and glutes, quad  TFL stretch Modified thomas stretch HS stretch    04/01/24 Recumbent bike half revs 7 min  10/06 Recumbent bike half revs 5 min Knee flexion to 100 degrees with short axis distraction  Contract relax into flexion (short sitting) and extension (supine)  TKE with black theraband Retro walking with focus on knee extension and HS stretch with wall support.  Knee flexion with towel roll under knee onto elevated surface.  Toe touch from step   10/1  Recumbent bike half revs 5 min   Screw home mob grade IV Flexion mob with tibial IR grade IV in max tolerable flexion  Stair step flexion stretch 10s 10x Calf stretch with quad set heel propped 3s 2x10 RTB TKE 2x10 Midthigh height STS 2x10  103 flexion -3 ext   [facility] Adult PT Treatment:                                                DATE: 03/19/24 Pt seen for aquatic therapy today.  Treatment took place in water  3.5-4.75 ft in depth at the [facility] pool. Temp of water  was 91.  Pt entered/exited the pool via stairs using step to pattern with hand rail.  *walking forward and backward wand side stepping * Lt Runner's step ups onto 2nd step x 10 with light UE on R rail * at stairs: forward lunge with Lt foot on 2nd step -> hamstring stretch with pt's overpressure into extension with foot on 3rd step x 3 rounds *L stretch * Lt gastroc stretch with heel off of step * side step into squat with blue hand floats and arm  add/abdct *forward split squat * KTC stretch with feet in bottom and 2nd hole in ladder, UE on rails  Pt requires the buoyancy and hydrostatic pressure of water  for support, and to offload joints by unweighting joint load by at least 50 % in navel deep water  and by at least 75-80% in chest to neck deep water .  Viscosity of the water  is needed for resistance of strengthening. Water  current perturbations provides challenge to standing balance requiring increased core activation.    Left knee AAROM: 103 flex   PATIENT EDUCATION:  Education details:  exercise form/rationale Person educated: Patient Education method: Explanation, Demonstration, Tactile cues, Verbal cues,  Education comprehension: verbalized understanding, returned demonstration, verbal cues required, tactile cues required, and needs further education  HOME EXERCISE PROGRAM: Access Code: [email or web address] URL: [email or web address] Date: 02/05/2024 Prepared by: Harlene Cordon  Program Notes massage skin around knee  Exercises - Seated Hamstring Stretch  - 2-3 x daily - 7  x weekly - 2 sets - 5 breaths hold - Standing Weight Shift  - 5 x daily - 7 x weekly - Stride Stance Weight Shift  - 5 x daily - 7 x weekly - Quadruped Rocking Backward  - 2-3 x daily - 7 x weekly - 1 sets - 10 reps - Standing Gluteal Sets  - Standing Quad Set    ASSESSMENT:  CLINICAL IMPRESSION: Tolerated session well. Treatment focused on manual therapy techniques to decrease tension and improve patella mobility for improve knee extension ROM. Challenged pt with step up's and downs with improvements noted pre and post IASTM. Pt with tendency to circumduct with step up's, but shows improvements with cues. Educated on relevant anatomy and activity modifications. Will continue to benefit from therapy to address remaining limitations and improve to PLOF.    Initial Subjective Patient is a 63 y.o. M who was seen today for physical therapy  evaluation and treatment for s/p Lt TKA. He is just over a month out, lacking ROM and wants to do the pool for therapy. Discussed use of aquatics and transition to land-based exercises, once he is independent in the pool, for functional workouts.  Pt is a member at [facility] so he has access to the pool. Will benefit from skilled PT to meet functional goals.     REHAB POTENTIAL: Good  CLINICAL DECISION MAKING: Stable/uncomplicated  EVALUATION COMPLEXITY: Low   GOALS: Goals reviewed with patient? Yes  SHORT TERM GOALS: Target date: 8/30  Knee ROM 0-90+ Baseline: Goal status: Partially Met 02/28/24; Met 03/19/24  2.  Demo proper heel toe gait without cuing Baseline:  Goal status:Met 02/28/24    LONG TERM GOALS: Target date: POC date  Independent with long term exercise program in gym and pool Baseline:  Goal status: Met 03/19/24  2.  Knee ROM 0-120 Baseline:  Goal status: In progress 04/01/24  3.  Able to get in/out of chairs without UE assist, without increase in pain Baseline:  Goal status: Ongoing 04/01/24  4.  Demo SLS controlled balance for at least 10 s on surgical LE Baseline:  Goal status: Ongoing 04/01/24  PLAN:  PT FREQUENCY: 1-2x/week  PT DURATION: POC date  PLANNED INTERVENTIONS: 97164- PT Re-evaluation, 97750- Physical Performance Testing, 97110-Therapeutic exercises, 97530- Therapeutic activity, 97112- Neuromuscular re-education, 97535- Self Care, 02859- Manual therapy, [phone]- Gait training, [phone]- Aquatic Therapy, [phone] (1-2 muscles), 20561 (3+ muscles)- Dry Needling, Patient/Family education, Balance training, Stair training, Taping, Joint mobilization, Spinal mobilization, Scar mobilization, and Cryotherapy.  PLAN FOR NEXT SESSION: continue progressive LE strengthening and Lt knee ROM.   Stairs - descending; STS without abdct of LLE.    Rojean JONELLE Batten, PT 04/15/2024, 12:48 PM

## 2024-04-17 ENCOUNTER — Encounter (HOSPITAL_BASED_OUTPATIENT_CLINIC_OR_DEPARTMENT_OTHER): Payer: Self-pay | Admitting: Physical Therapy

## 2024-04-17 ENCOUNTER — Ambulatory Visit (HOSPITAL_BASED_OUTPATIENT_CLINIC_OR_DEPARTMENT_OTHER): Admitting: Physical Therapy

## 2024-04-17 DIAGNOSIS — R262 Difficulty in walking, not elsewhere classified: Secondary | ICD-10-CM

## 2024-04-17 DIAGNOSIS — M25562 Pain in left knee: Secondary | ICD-10-CM | POA: Diagnosis not present

## 2024-04-17 DIAGNOSIS — M25662 Stiffness of left knee, not elsewhere classified: Secondary | ICD-10-CM

## 2024-04-17 DIAGNOSIS — M6281 Muscle weakness (generalized): Secondary | ICD-10-CM

## 2024-04-17 NOTE — Therapy (Addendum)
 OUTPATIENT PHYSICAL THERAPY TREATMENT    Patient Name: William Savage MRN: [id number] DOB:20-Mar-1961, 63 y.o., male Today's Date: 04/17/2024  END OF SESSION:  PT End of Session - 04/17/24 1102     Visit Number 17    Number of Visits 31    Date for Recertification  06/04/24    Authorization Type VA- awaiting auth    Authorization Time Period 15 additional visits approved From 08.18.2025 - 12.16.2025    Authorization - Visit Number 2    Authorization - Number of Visits 15    PT Start Time 1100    PT Stop Time 1145    PT Time Calculation (min) 45 min    Activity Tolerance Patient tolerated treatment well    Behavior During Therapy WFL for tasks assessed/performed             Past Medical History:  Diagnosis Date   Ankle instability    bilateral   Chronic kidney disease    Diabetes (HCC)    Fatty liver    Fatty liver    Hyperlipidemia    Hypertension    IBS (irritable bowel syndrome)    Left shoulder strain    Migraines    Nonobstructive atherosclerosis of coronary artery    Plantar fasciitis    PTSD (post-traumatic stress disorder)    Radiculopathy    Sleep apnea    TIA (transient ischemic attack)    Past Surgical History:  Procedure Laterality Date   BUNIONECTOMY Right    COLONOSCOPY  2021   TOTAL KNEE ARTHROPLASTY Left 01/02/2024   Procedure: ARTHROPLASTY, KNEE, TOTAL;  Surgeon: Ernie Cough, MD;  Location: [facility];  Service: Orthopedics;  Laterality: Left;   Patient Active Problem List   Diagnosis Date Noted   S/P total knee arthroplasty, left 01/02/2024   S/P total knee replacement, left 01/02/2024   Effusion, left knee 03/27/2023   Idiopathic gout of multiple sites 03/27/2023   Radiculitis 03/27/2023   Chronic low back pain 03/27/2023   Osteoarthritis of left knee 03/27/2023   Bilateral plantar fasciitis 03/27/2023   REFERRING PROVIDER:  Ernie Cough, MD   REFERRING DIAG: [phone] (ICD-10-CM) - Presence of left artificial knee joint   S/p Lt TKA  Rationale for Evaluation and Treatment: Rehabilitation  THERAPY DIAG:  Acute pain of left knee  Stiffness of left knee, not elsewhere classified  Difficulty in walking, not elsewhere classified  Muscle weakness (generalized)  ONSET DATE: DOS 01/02/24   SUBJECTIVE:  SUBJECTIVE STATEMENT: States he is doing well and still researching dry needling.   PERTINENT HISTORY:  Bilat feet N/T from time to time  PAIN:  Are you having pain? Yes: NPRS scale: 6/10 Pain location: Lt anterior knee Pain description: numb, tight Aggravating factors: sleeping is rough Relieving factors: movement/exercise  PRECAUTIONS:  None  RED FLAGS: None   WEIGHT BEARING RESTRICTIONS:  No  FALLS:  Has patient fallen in last 6 months? No  LIVING ENVIRONMENT: Stairs in home but has bedroom on first level  OCCUPATION:  retired  PLOF:  Independent  PATIENT GOALS:  Walking over a mile, yoga, upright stationary cycling   OBJECTIVE:  Note: Objective measures were completed at Evaluation unless otherwise noted.   PATIENT SURVEYS:  LEFS  Extreme difficulty/unable (0), Quite a bit of difficulty (1), Moderate difficulty (2), Little difficulty (3), No difficulty (4) Survey date:  02/21/24 03/12/24  Any of your usual work, housework or school activities 0 0  2. Usual hobbies, recreational or sporting activities 0 0  3. Getting into/out of the bath 1 1  4. Walking between rooms 1 2  5. Putting on socks/shoes 0 1  6. Squatting  0 0  7. Lifting an object, like a bag of groceries from the floor 1 3  8. Performing light activities around your home 2 2  9. Performing heavy activities around your home 0 0  10. Getting into/out of a car 1 2  11. Walking 2 blocks 0 1  12. Walking 1 mile 0 0  13. Going  up/down 10 stairs (1 flight) 1 2  14. Standing for 1 hour 0 0  15.  sitting for 1 hour 1 1  16. Running on even ground 0 0  17. Running on uneven ground 0 0  18. Making sharp turns while running fast 0 0  19. Hopping  0 0  20. Rolling over in bed 1 2  Score total:  9/80 17/80    COGNITIVE STATUS: Within functional limits for tasks assessed   SENSATION: Anterior numbness  EDEMA:  Yes: mild as expected post op  GAIT: Comments: EVAL  rollator and cane available, lacking full knee ext at heel strike   Body Part #1 Knee  PALPATION: EVAL: limited scar/skin mobility around knee  LOWER EXTREMITY ROM:     Active  Left eval Left 02/28/24 Left  9/26 04/01/24 04/10/24 04/17/24  Knee flexion 84 95 AAROM 100 AAROM 108 AAROM 90 101 105 110 AAROM 105 after   Knee extension -10  -13 -3 AAROM in supine -10 before edema massage  -7 after -12 before manual, 7 AROM after manual, 3 at end of session    (Blank rows = not tested)   TREATMENT DATE: 04/17/24 SciFit bike x5 min  AP mobilization with ER/ screw home mobilizations PROM flexion extension with end range holds Seated leg curl 3x10 40#, legs elevated to hyperextension   04/15/24 Recumbent bike half revs and hip hike with full revolutions 6 min Long axis distraction to knee with contract relax into flexion.  Patella mobs with movement.  IASTM to HS, and quads Step up and down off 6 step with cues for proper mechanics  - Test/ retest following IASTM Active walking between exercises to assess symptoms.    04/10/24: Recumbent bike half revs and hip hike with full revolutions 6 min Edema massage  Long axis distraction to knee with quad sets x10 AP mobilization with ER/ screw home mobilizations Contract/relax and hold/relax techniques  seated/prone Prone quad set  with contralateral overpressure x5  04/08/24: Recumbent bike half revs and hip hip with full revolutions 6 min 1 flight of stairs up/ down with alternating  gait pattern 1 lap around track with focus on heel to toe gait pattern.  SL ball throws x15  Hanging from straps with rocking into flexion to tolerance.  OP into extension with contract relax - pt at -10 in neutral today.  Screw home mobs Sit to stand without UE assist x5  Discussion about potential improvements and timeline  10/15  Recumbent bike half revs 5 min Leg press 125lbs with DL Leg press 24oad SL and sidelying  Knee flexion to 112 degrees in supine Knee extension contract relax reaching -10 degrees in supine Toe touch from 6 step  TKE with purple strap  STM to adductors.   04/01/24 Recumbent bike half revs 7 min Knee flexion to 108 degrees with short axis distraction  Contract relax into flexion (short sitting) and extension (supine)  Theragun to TFL and glutes, quad  TFL stretch Modified thomas stretch HS stretch    04/01/24 Recumbent bike half revs 7 min  10/06 Recumbent bike half revs 5 min Knee flexion to 100 degrees with short axis distraction  Contract relax into flexion (short sitting) and extension (supine)  TKE with black theraband Retro walking with focus on knee extension and HS stretch with wall support.  Knee flexion with towel roll under knee onto elevated surface.  Toe touch from step   10/1  Recumbent bike half revs 5 min   Screw home mob grade IV Flexion mob with tibial IR grade IV in max tolerable flexion  Stair step flexion stretch 10s 10x Calf stretch with quad set heel propped 3s 2x10 RTB TKE 2x10 Midthigh height STS 2x10  103 flexion -3 ext   [facility] Adult PT Treatment:                                                DATE: 03/19/24 Pt seen for aquatic therapy today.  Treatment took place in water  3.5-4.75 ft in depth at the [facility] pool. Temp of water  was 91.  Pt entered/exited the pool via stairs using step to pattern with hand rail.  *walking forward and backward wand side stepping * Lt Runner's step ups onto  2nd step x 10 with light UE on R rail * at stairs: forward lunge with Lt foot on 2nd step -> hamstring stretch with pt's overpressure into extension with foot on 3rd step x 3 rounds *L stretch * Lt gastroc stretch with heel off of step * side step into squat with blue hand floats and arm add/abdct *forward split squat * KTC stretch with feet in bottom and 2nd hole in ladder, UE on rails  Pt requires the buoyancy and hydrostatic pressure of water  for support, and to offload joints by unweighting joint load by at least 50 % in navel deep water  and by at least 75-80% in chest to neck deep water .  Viscosity of the water  is needed for resistance of strengthening. Water  current perturbations provides challenge to standing balance requiring increased core activation.    Left knee AAROM: 103 flex   PATIENT EDUCATION:  Education details:  exercise form/rationale Person educated: Patient Education method: Explanation, Demonstration, Tactile cues, Verbal cues,  Education comprehension: verbalized understanding, returned  demonstration, verbal cues required, tactile cues required, and needs further education  HOME EXERCISE PROGRAM: Access Code: [email or web address] URL: [email or web address] Date: 02/05/2024 Prepared by: Harlene Cordon  Program Notes massage skin around knee  Exercises - Seated Hamstring Stretch  - 2-3 x daily - 7 x weekly - 2 sets - 5 breaths hold - Standing Weight Shift  - 5 x daily - 7 x weekly - Stride Stance Weight Shift  - 5 x daily - 7 x weekly - Quadruped Rocking Backward  - 2-3 x daily - 7 x weekly - 1 sets - 10 reps - Standing Gluteal Sets  - Standing Quad Set    ASSESSMENT:  CLINICAL IMPRESSION: Tolerated session well. Manual utilized to promote knee ROM and decrease stiffness. Exercises focused on activating hamstrings to decrease stiffness and improve knee ROM. Educated on machine set up and knee position at home to improve ROM. Will continue to benefit  from therapy to address remaining limitations.   Initial Subjective Patient is a 63 y.o. M who was seen today for physical therapy evaluation and treatment for s/p Lt TKA. He is just over a month out, lacking ROM and wants to do the pool for therapy. Discussed use of aquatics and transition to land-based exercises, once he is independent in the pool, for functional workouts.  Pt is a member at [employer] so he has access to the pool. Will benefit from skilled PT to meet functional goals.     REHAB POTENTIAL: Good  CLINICAL DECISION MAKING: Stable/uncomplicated  EVALUATION COMPLEXITY: Low   GOALS: Goals reviewed with patient? Yes  SHORT TERM GOALS: Target date: 8/30  Knee ROM 0-90+ Baseline: Goal status: Partially Met 02/28/24; Met 03/19/24  2.  Demo proper heel toe gait without cuing Baseline:  Goal status:Met 02/28/24    LONG TERM GOALS: Target date: POC date  Independent with long term exercise program in gym and pool Baseline:  Goal status: Met 03/19/24  2.  Knee ROM 0-120 Baseline:  Goal status: In progress 04/01/24  3.  Able to get in/out of chairs without UE assist, without increase in pain Baseline:  Goal status: Ongoing 04/01/24  4.  Demo SLS controlled balance for at least 10 s on surgical LE Baseline:  Goal status: Ongoing 04/01/24  PLAN:  PT FREQUENCY: 1-2x/week  PT DURATION: POC date  PLANNED INTERVENTIONS: 97164- PT Re-evaluation, 97750- Physical Performance Testing, 97110-Therapeutic exercises, 97530- Therapeutic activity, 97112- Neuromuscular re-education, 97535- Self Care, 02859- Manual therapy, [phone]- Gait training, [phone]- Aquatic Therapy, [phone] (1-2 muscles), 20561 (3+ muscles)- Dry Needling, Patient/Family education, Balance training, Stair training, Taping, Joint mobilization, Spinal mobilization, Scar mobilization, and Cryotherapy.  PLAN FOR NEXT SESSION: continue progressive LE strengthening and Lt knee ROM.   Stairs - descending; STS without  abdct of LLE.    Lili Finder, Student-PT 04/17/2024, 11:12 AM    This entire session was performed under direct supervision and direction of a licensed therapist/therapist assistant . I have personally read, edited and approve of the note as written. 12:06 PM, 04/17/24 Prentice CANDIE Stains PT, DPT Physical Therapist at [facility]

## 2024-04-25 ENCOUNTER — Encounter (HOSPITAL_BASED_OUTPATIENT_CLINIC_OR_DEPARTMENT_OTHER): Payer: Self-pay | Admitting: Physical Therapy

## 2024-04-25 ENCOUNTER — Ambulatory Visit (HOSPITAL_BASED_OUTPATIENT_CLINIC_OR_DEPARTMENT_OTHER): Attending: Orthopedic Surgery | Admitting: Physical Therapy

## 2024-04-25 DIAGNOSIS — R262 Difficulty in walking, not elsewhere classified: Secondary | ICD-10-CM | POA: Insufficient documentation

## 2024-04-25 DIAGNOSIS — M6281 Muscle weakness (generalized): Secondary | ICD-10-CM | POA: Insufficient documentation

## 2024-04-25 DIAGNOSIS — M25562 Pain in left knee: Secondary | ICD-10-CM | POA: Insufficient documentation

## 2024-04-25 DIAGNOSIS — M25662 Stiffness of left knee, not elsewhere classified: Secondary | ICD-10-CM | POA: Insufficient documentation

## 2024-04-25 NOTE — Therapy (Addendum)
 OUTPATIENT PHYSICAL THERAPY TREATMENT    Patient Name: William Savage MRN: [id number] DOB:1961/04/19, 63 y.o., male Today's Date: 04/25/2024  END OF SESSION:  PT End of Session - 04/25/24 1017     Visit Number 18    Number of Visits 31    Date for Recertification  06/04/24    Authorization Type VA- awaiting auth    Authorization Time Period 15 additional visits approved From 08.18.2025 - 12.16.2025    Authorization - Visit Number 3    Authorization - Number of Visits 15    PT Start Time 1017    PT Stop Time 1107    PT Time Calculation (min) 50 min    Activity Tolerance Patient tolerated treatment well    Behavior During Therapy WFL for tasks assessed/performed           Past Medical History:  Diagnosis Date   Ankle instability    bilateral   Chronic kidney disease    Diabetes (HCC)    Fatty liver    Fatty liver    Hyperlipidemia    Hypertension    IBS (irritable bowel syndrome)    Left shoulder strain    Migraines    Nonobstructive atherosclerosis of coronary artery    Plantar fasciitis    PTSD (post-traumatic stress disorder)    Radiculopathy    Sleep apnea    TIA (transient ischemic attack)    Past Surgical History:  Procedure Laterality Date   BUNIONECTOMY Right    COLONOSCOPY  2021   TOTAL KNEE ARTHROPLASTY Left 01/02/2024   Procedure: ARTHROPLASTY, KNEE, TOTAL;  Surgeon: Ernie Cough, MD;  Location: [facility];  Service: Orthopedics;  Laterality: Left;   Patient Active Problem List   Diagnosis Date Noted   S/P total knee arthroplasty, left 01/02/2024   S/P total knee replacement, left 01/02/2024   Effusion, left knee 03/27/2023   Idiopathic gout of multiple sites 03/27/2023   Radiculitis 03/27/2023   Chronic low back pain 03/27/2023   Osteoarthritis of left knee 03/27/2023   Bilateral plantar fasciitis 03/27/2023   REFERRING PROVIDER:  Ernie Cough, MD   REFERRING DIAG: [phone] (ICD-10-CM) - Presence of left artificial knee joint  S/p Lt  TKA  Rationale for Evaluation and Treatment: Rehabilitation  THERAPY DIAG:  Acute pain of left knee  Stiffness of left knee, not elsewhere classified  Difficulty in walking, not elsewhere classified  Muscle weakness (generalized)  ONSET DATE: DOS 01/02/24   SUBJECTIVE:  SUBJECTIVE STATEMENT: States he is doing well today. About 5/10 pain. Has been massaging muscles and that seems to be helping.   PERTINENT HISTORY:  Bilat feet N/T from time to time  PAIN:  Are you having pain? Yes: NPRS scale: 6/10 Pain location: Lt anterior knee Pain description: numb, tight Aggravating factors: sleeping is rough Relieving factors: movement/exercise  PRECAUTIONS:  None  RED FLAGS: None   WEIGHT BEARING RESTRICTIONS:  No  FALLS:  Has patient fallen in last 6 months? No  LIVING ENVIRONMENT: Stairs in home but has bedroom on first level  OCCUPATION:  retired  PLOF:  Independent  PATIENT GOALS:  Walking over a mile, yoga, upright stationary cycling   OBJECTIVE:  Note: Objective measures were completed at Evaluation unless otherwise noted.   PATIENT SURVEYS:  LEFS  Extreme difficulty/unable (0), Quite a bit of difficulty (1), Moderate difficulty (2), Little difficulty (3), No difficulty (4) Survey date:  02/21/24 03/12/24  Any of your usual work, housework or school activities 0 0  2. Usual hobbies, recreational or sporting activities 0 0  3. Getting into/out of the bath 1 1  4. Walking between rooms 1 2  5. Putting on socks/shoes 0 1  6. Squatting  0 0  7. Lifting an object, like a bag of groceries from the floor 1 3  8. Performing light activities around your home 2 2  9. Performing heavy activities around your home 0 0  10. Getting into/out of a car 1 2  11. Walking 2 blocks 0 1   12. Walking 1 mile 0 0  13. Going up/down 10 stairs (1 flight) 1 2  14. Standing for 1 hour 0 0  15.  sitting for 1 hour 1 1  16. Running on even ground 0 0  17. Running on uneven ground 0 0  18. Making sharp turns while running fast 0 0  19. Hopping  0 0  20. Rolling over in bed 1 2  Score total:  9/80 17/80    COGNITIVE STATUS: Within functional limits for tasks assessed   SENSATION: Anterior numbness  EDEMA:  Yes: mild as expected post op  GAIT: Comments: EVAL  rollator and cane available, lacking full knee ext at heel strike   Body Part #1 Knee  PALPATION: EVAL: limited scar/skin mobility around knee  LOWER EXTREMITY ROM:     Active  Left eval Left 02/28/24 Left  9/26 04/01/24 04/10/24 04/17/24 04/25/24  Knee flexion 84 95 AAROM 100 AAROM 108 AAROM 90 101 105 110 AAROM 105 after    Knee extension -10  -13 -3 AAROM in supine -10 before edema massage  -7 after -12 before manual, 7 AROM after manual, 3 at end of session  -14, got to -6 resting and -3 AAROM   (Blank rows = not tested)   TREATMENT DATE: 04/25/24 SciFit bike x5 min  Screw home mobilization  Quad set with long arc distraction  Patellar mobilizations  AP mobilization with distraction STM to hamstrings/adductors along with Cupping to hamstrings/ITB  04/17/24 SciFit bike x5 min  AP mobilization with ER/ screw home mobilizations PROM flexion extension with end range holds Seated leg curl 3x10 40#, legs elevated to hyperextension   04/15/24 Recumbent bike half revs and hip hike with full revolutions 6 min Long axis distraction to knee with contract relax into flexion.  Patella mobs with movement.  IASTM to HS, and quads Step up and down off 6 step with cues for proper mechanics  -  Test/ retest following IASTM Active walking between exercises to assess symptoms.    04/10/24: Recumbent bike half revs and hip hike with full revolutions 6 min Edema massage  Long axis distraction to knee  with quad sets x10 AP mobilization with ER/ screw home mobilizations Contract/relax and hold/relax techniques seated/prone Prone quad set  with contralateral overpressure x5  04/08/24: Recumbent bike half revs and hip hip with full revolutions 6 min 1 flight of stairs up/ down with alternating gait pattern 1 lap around track with focus on heel to toe gait pattern.  SL ball throws x15  Hanging from straps with rocking into flexion to tolerance.  OP into extension with contract relax - pt at -10 in neutral today.  Screw home mobs Sit to stand without UE assist x5  Discussion about potential improvements and timeline  10/15  Recumbent bike half revs 5 min Leg press 125lbs with DL Leg press 24oad SL and sidelying  Knee flexion to 112 degrees in supine Knee extension contract relax reaching -10 degrees in supine Toe touch from 6 step  TKE with purple strap  STM to adductors.   04/01/24 Recumbent bike half revs 7 min Knee flexion to 108 degrees with short axis distraction  Contract relax into flexion (short sitting) and extension (supine)  Theragun to TFL and glutes, quad  TFL stretch Modified thomas stretch HS stretch    04/01/24 Recumbent bike half revs 7 min  10/06 Recumbent bike half revs 5 min Knee flexion to 100 degrees with short axis distraction  Contract relax into flexion (short sitting) and extension (supine)  TKE with black theraband Retro walking with focus on knee extension and HS stretch with wall support.  Knee flexion with towel roll under knee onto elevated surface.  Toe touch from step   10/1  Recumbent bike half revs 5 min   Screw home mob grade IV Flexion mob with tibial IR grade IV in max tolerable flexion  Stair step flexion stretch 10s 10x Calf stretch with quad set heel propped 3s 2x10 RTB TKE 2x10 Midthigh height STS 2x10  103 flexion -3 ext   [facility] Adult PT Treatment:                                                DATE:  03/19/24 Pt seen for aquatic therapy today.  Treatment took place in water  3.5-4.75 ft in depth at the [facility] pool. Temp of water  was 91.  Pt entered/exited the pool via stairs using step to pattern with hand rail.  *walking forward and backward wand side stepping * Lt Runner's step ups onto 2nd step x 10 with light UE on R rail * at stairs: forward lunge with Lt foot on 2nd step -> hamstring stretch with pt's overpressure into extension with foot on 3rd step x 3 rounds *L stretch * Lt gastroc stretch with heel off of step * side step into squat with blue hand floats and arm add/abdct *forward split squat * KTC stretch with feet in bottom and 2nd hole in ladder, UE on rails  Pt requires the buoyancy and hydrostatic pressure of water  for support, and to offload joints by unweighting joint load by at least 50 % in navel deep water  and by at least 75-80% in chest to neck deep water .  Viscosity of the water  is needed for  resistance of strengthening. Water  current perturbations provides challenge to standing balance requiring increased core activation.    Left knee AAROM: 103 flex   PATIENT EDUCATION:  Education details:  exercise form/rationale Person educated: Patient Education method: Explanation, Demonstration, Tactile cues, Verbal cues,  Education comprehension: verbalized understanding, returned demonstration, verbal cues required, tactile cues required, and needs further education  HOME EXERCISE PROGRAM: Access Code: [email or web address] URL: [email or web address] Date: 02/05/2024 Prepared by: Harlene Cordon  Program Notes massage skin around knee  Exercises - Seated Hamstring Stretch  - 2-3 x daily - 7 x weekly - 2 sets - 5 breaths hold - Standing Weight Shift  - 5 x daily - 7 x weekly - Stride Stance Weight Shift  - 5 x daily - 7 x weekly - Quadruped Rocking Backward  - 2-3 x daily - 7 x weekly - 1 sets - 10 reps - Standing Gluteal Sets  - Standing Quad  Set    ASSESSMENT:  CLINICAL IMPRESSION: Tolerated session well. Manual utilized to increase knee ROM and decrease stiffness. Benefited a lot from manual, going from lacking 14 deg of full knee extension to lacking 3 degrees. Utilized cupping to help decrease hyperactive hamstrings. Patient benefited from cupping and had less trigger points in muscle afterward. Educated on  knee position at home to improve ROM. Will continue to benefit from therapy to address remaining limitations.   Initial Subjective Patient is a 63 y.o. M who was seen today for physical therapy evaluation and treatment for s/p Lt TKA. He is just over a month out, lacking ROM and wants to do the pool for therapy. Discussed use of aquatics and transition to land-based exercises, once he is independent in the pool, for functional workouts.  Pt is a member at [facility] so he has access to the pool. Will benefit from skilled PT to meet functional goals.     REHAB POTENTIAL: Good  CLINICAL DECISION MAKING: Stable/uncomplicated  EVALUATION COMPLEXITY: Low   GOALS: Goals reviewed with patient? Yes  SHORT TERM GOALS: Target date: 8/30  Knee ROM 0-90+ Baseline: Goal status: Partially Met 02/28/24; Met 03/19/24  2.  Demo proper heel toe gait without cuing Baseline:  Goal status:Met 02/28/24    LONG TERM GOALS: Target date: POC date  Independent with long term exercise program in gym and pool Baseline:  Goal status: Met 03/19/24  2.  Knee ROM 0-120 Baseline:  Goal status: In progress 04/01/24  3.  Able to get in/out of chairs without UE assist, without increase in pain Baseline:  Goal status: Ongoing 04/01/24  4.  Demo SLS controlled balance for at least 10 s on surgical LE Baseline:  Goal status: Ongoing 04/01/24  PLAN:  PT FREQUENCY: 1-2x/week  PT DURATION: POC date  PLANNED INTERVENTIONS: 97164- PT Re-evaluation, 97750- Physical Performance Testing, 97110-Therapeutic exercises, 97530- Therapeutic  activity, 97112- Neuromuscular re-education, 97535- Self Care, 02859- Manual therapy, [phone]- Gait training, [phone]- Aquatic Therapy, [phone] (1-2 muscles), 20561 (3+ muscles)- Dry Needling, Patient/Family education, Balance training, Stair training, Taping, Joint mobilization, Spinal mobilization, Scar mobilization, and Cryotherapy.  PLAN FOR NEXT SESSION: continue progressive LE strengthening and Lt knee ROM.   Stairs - descending; STS without abdct of LLE.    Lili Finder, Student-PT 04/25/2024, 11:08 AM    This entire session was performed under direct supervision and direction of a licensed therapist/therapist assistant . I have personally read, edited and approve of the note as written. 11:30 AM, 04/25/24 Prentice CANDIE Stains PT, DPT Physical Therapist at  [facility]

## 2024-04-30 ENCOUNTER — Encounter (HOSPITAL_BASED_OUTPATIENT_CLINIC_OR_DEPARTMENT_OTHER): Payer: Self-pay | Admitting: Physical Therapy

## 2024-04-30 ENCOUNTER — Ambulatory Visit (HOSPITAL_BASED_OUTPATIENT_CLINIC_OR_DEPARTMENT_OTHER): Admitting: Physical Therapy

## 2024-04-30 DIAGNOSIS — M25562 Pain in left knee: Secondary | ICD-10-CM

## 2024-04-30 DIAGNOSIS — M25662 Stiffness of left knee, not elsewhere classified: Secondary | ICD-10-CM

## 2024-04-30 DIAGNOSIS — M6281 Muscle weakness (generalized): Secondary | ICD-10-CM

## 2024-04-30 DIAGNOSIS — R262 Difficulty in walking, not elsewhere classified: Secondary | ICD-10-CM

## 2024-04-30 NOTE — Therapy (Addendum)
 OUTPATIENT PHYSICAL THERAPY TREATMENT    Patient Name: William Savage MRN: [id number] DOB:1960/08/20, 63 y.o., male Today's Date: 04/30/2024  END OF SESSION:  PT End of Session - 04/30/24 1256     Visit Number 19    Number of Visits 31    Date for Recertification  06/04/24    Authorization Type VA- awaiting auth    Authorization Time Period 15 additional visits approved From 08.18.2025 - 12.16.2025    Authorization - Visit Number 4    Authorization - Number of Visits 15    PT Start Time 1300    PT Stop Time 1348    PT Time Calculation (min) 48 min    Activity Tolerance Patient tolerated treatment well    Behavior During Therapy WFL for tasks assessed/performed           Past Medical History:  Diagnosis Date   Ankle instability    bilateral   Chronic kidney disease    Diabetes (HCC)    Fatty liver    Fatty liver    Hyperlipidemia    Hypertension    IBS (irritable bowel syndrome)    Left shoulder strain    Migraines    Nonobstructive atherosclerosis of coronary artery    Plantar fasciitis    PTSD (post-traumatic stress disorder)    Radiculopathy    Sleep apnea    TIA (transient ischemic attack)    Past Surgical History:  Procedure Laterality Date   BUNIONECTOMY Right    COLONOSCOPY  2021   TOTAL KNEE ARTHROPLASTY Left 01/02/2024   Procedure: ARTHROPLASTY, KNEE, TOTAL;  Surgeon: Ernie Cough, MD;  Location: [facility];  Service: Orthopedics;  Laterality: Left;   Patient Active Problem List   Diagnosis Date Noted   S/P total knee arthroplasty, left 01/02/2024   S/P total knee replacement, left 01/02/2024   Effusion, left knee 03/27/2023   Idiopathic gout of multiple sites 03/27/2023   Radiculitis 03/27/2023   Chronic low back pain 03/27/2023   Osteoarthritis of left knee 03/27/2023   Bilateral plantar fasciitis 03/27/2023   REFERRING PROVIDER:  Ernie Cough, MD   REFERRING DIAG: [phone] (ICD-10-CM) - Presence of left artificial knee joint  S/p Lt  TKA  Rationale for Evaluation and Treatment: Rehabilitation  THERAPY DIAG:  Acute pain of left knee  Stiffness of left knee, not elsewhere classified  Difficulty in walking, not elsewhere classified  Muscle weakness (generalized)  ONSET DATE: DOS 01/02/24   SUBJECTIVE:  SUBJECTIVE STATEMENT: States he is doing well today. About 5/10 pain. Has been massaging muscles and that seems to be helping.   PERTINENT HISTORY:  Bilat feet N/T from time to time  PAIN:  Are you having pain? Yes: NPRS scale: 6/10 Pain location: Lt anterior knee Pain description: numb, tight Aggravating factors: sleeping is rough Relieving factors: movement/exercise  PRECAUTIONS:  None  RED FLAGS: None   WEIGHT BEARING RESTRICTIONS:  No  FALLS:  Has patient fallen in last 6 months? No  LIVING ENVIRONMENT: Stairs in home but has bedroom on first level  OCCUPATION:  retired  PLOF:  Independent  PATIENT GOALS:  Walking over a mile, yoga, upright stationary cycling   OBJECTIVE:  Note: Objective measures were completed at Evaluation unless otherwise noted.   PATIENT SURVEYS:  LEFS  Extreme difficulty/unable (0), Quite a bit of difficulty (1), Moderate difficulty (2), Little difficulty (3), No difficulty (4) Survey date:  02/21/24 03/12/24  Any of your usual work, housework or school activities 0 0  2. Usual hobbies, recreational or sporting activities 0 0  3. Getting into/out of the bath 1 1  4. Walking between rooms 1 2  5. Putting on socks/shoes 0 1  6. Squatting  0 0  7. Lifting an object, like a bag of groceries from the floor 1 3  8. Performing light activities around your home 2 2  9. Performing heavy activities around your home 0 0  10. Getting into/out of a car 1 2  11. Walking 2 blocks 0 1   12. Walking 1 mile 0 0  13. Going up/down 10 stairs (1 flight) 1 2  14. Standing for 1 hour 0 0  15.  sitting for 1 hour 1 1  16. Running on even ground 0 0  17. Running on uneven ground 0 0  18. Making sharp turns while running fast 0 0  19. Hopping  0 0  20. Rolling over in bed 1 2  Score total:  9/80 17/80    COGNITIVE STATUS: Within functional limits for tasks assessed   SENSATION: Anterior numbness  EDEMA:  Yes: mild as expected post op  GAIT: Comments: EVAL  rollator and cane available, lacking full knee ext at heel strike   Body Part #1 Knee  PALPATION: EVAL: limited scar/skin mobility around knee  LOWER EXTREMITY ROM:     Active  Left eval Left 02/28/24 Left  9/26 04/01/24 04/10/24 04/17/24 04/25/24 04/30/24  Knee flexion 84 95 AAROM 100 AAROM 108 AAROM 90 101 105 110 AAROM 105 after   85 supine, 105 AAROM  seated   Knee extension -10  -13 -3 AAROM in supine -10 before edema massage  -7 after -12 before manual, 7 AROM after manual, 3 at end of session  -14, got to -6 resting and -3 AAROM -10, 4 AAROM   (Blank rows = not tested)   TREATMENT DATE: 04/30/24 STM to hamstrings/adductors along with Cupping to hamstrings/ITBScrew home mobilization  Quad set with long arc distraction  Patellar mobilizations  AP mobilization with distraction Screw home mobilization with distraction   04/25/24 SciFit bike x5 min  Screw home mobilization  Quad set with long arc distraction  Patellar mobilizations  AP mobilization with distraction STM to hamstrings/adductors along with Cupping to hamstrings/ITB  04/17/24 SciFit bike x5 min  AP mobilization with ER/ screw home mobilizations PROM flexion extension with end range holds Seated leg curl 3x10 40#, legs elevated to hyperextension   04/15/24 Recumbent  bike half revs and hip hike with full revolutions 6 min Long axis distraction to knee with contract relax into flexion.  Patella mobs with movement.  IASTM to  HS, and quads Step up and down off 6 step with cues for proper mechanics  - Test/ retest following IASTM Active walking between exercises to assess symptoms.    04/10/24: Recumbent bike half revs and hip hike with full revolutions 6 min Edema massage  Long axis distraction to knee with quad sets x10 AP mobilization with ER/ screw home mobilizations Contract/relax and hold/relax techniques seated/prone Prone quad set  with contralateral overpressure x5  04/08/24: Recumbent bike half revs and hip hip with full revolutions 6 min 1 flight of stairs up/ down with alternating gait pattern 1 lap around track with focus on heel to toe gait pattern.  SL ball throws x15  Hanging from straps with rocking into flexion to tolerance.  OP into extension with contract relax - pt at -10 in neutral today.  Screw home mobs Sit to stand without UE assist x5  Discussion about potential improvements and timeline  10/15  Recumbent bike half revs 5 min Leg press 125lbs with DL Leg press 24oad SL and sidelying  Knee flexion to 112 degrees in supine Knee extension contract relax reaching -10 degrees in supine Toe touch from 6 step  TKE with purple strap  STM to adductors.   04/01/24 Recumbent bike half revs 7 min Knee flexion to 108 degrees with short axis distraction  Contract relax into flexion (short sitting) and extension (supine)  Theragun to TFL and glutes, quad  TFL stretch Modified thomas stretch HS stretch   PATIENT EDUCATION:  Education details:  exercise form/rationale Person educated: Patient Education method: Explanation, Demonstration, Tactile cues, Verbal cues,  Education comprehension: verbalized understanding, returned demonstration, verbal cues required, tactile cues required, and needs further education  HOME EXERCISE PROGRAM: Access Code: [email or web address] URL: [email or web address] Date: 02/05/2024 Prepared by: Harlene Cordon  Program Notes massage  skin around knee  Exercises - Seated Hamstring Stretch  - 2-3 x daily - 7 x weekly - 2 sets - 5 breaths hold - Standing Weight Shift  - 5 x daily - 7 x weekly - Stride Stance Weight Shift  - 5 x daily - 7 x weekly - Quadruped Rocking Backward  - 2-3 x daily - 7 x weekly - 1 sets - 10 reps - Standing Gluteal Sets  - Standing Quad Set    ASSESSMENT:  CLINICAL IMPRESSION: Tolerated session well. Manual utilized to increase knee ROM and decrease stiffness. Benefited a lot from manual, going from lacking 10 deg of full knee extension to lacking 4 degrees. Utilized cupping to help decrease hyperactive hamstrings. Patient benefited from cupping and had less trigger points in semimembranous afterward. Educated on  knee position at home to improve ROM. Will continue to benefit from therapy to address remaining limitations.   Initial Subjective Patient is a 63 y.o. M who was seen today for physical therapy evaluation and treatment for s/p Lt TKA. He is just over a month out, lacking ROM and wants to do the pool for therapy. Discussed use of aquatics and transition to land-based exercises, once he is independent in the pool, for functional workouts.  Pt is a member at [facility] so he has access to the pool. Will benefit from skilled PT to meet functional goals.     REHAB POTENTIAL: Good  CLINICAL DECISION MAKING: Stable/uncomplicated  EVALUATION COMPLEXITY: Low   GOALS: Goals  reviewed with patient? Yes  SHORT TERM GOALS: Target date: 8/30  Knee ROM 0-90+ Baseline: Goal status: Partially Met 02/28/24; Met 03/19/24  2.  Demo proper heel toe gait without cuing Baseline:  Goal status:Met 02/28/24    LONG TERM GOALS: Target date: POC date  Independent with long term exercise program in gym and pool Baseline:  Goal status: Met 03/19/24  2.  Knee ROM 0-120 Baseline:  Goal status: In progress 04/01/24  3.  Able to get in/out of chairs without UE assist, without increase in  pain Baseline:  Goal status: Ongoing 04/01/24  4.  Demo SLS controlled balance for at least 10 s on surgical LE Baseline:  Goal status: Ongoing 04/01/24  PLAN:  PT FREQUENCY: 1-2x/week  PT DURATION: POC date  PLANNED INTERVENTIONS: 97164- PT Re-evaluation, 97750- Physical Performance Testing, 97110-Therapeutic exercises, 97530- Therapeutic activity, 97112- Neuromuscular re-education, 97535- Self Care, 02859- Manual therapy, [phone]- Gait training, [phone]- Aquatic Therapy, [phone] (1-2 muscles), 20561 (3+ muscles)- Dry Needling, Patient/Family education, Balance training, Stair training, Taping, Joint mobilization, Spinal mobilization, Scar mobilization, and Cryotherapy.  PLAN FOR NEXT SESSION: continue progressive LE strengthening and Lt knee ROM.   Stairs - descending; STS without abdct of LLE.    Lili Finder, Student-PT 04/30/2024, 1:50 PM    This entire session was performed under direct supervision and direction of a licensed therapist/therapist assistant . I have personally read, edited and approve of the note as written. 3:18 PM, 04/30/24 Prentice CANDIE Stains PT, DPT Physical Therapist at [facility]

## 2024-05-07 ENCOUNTER — Ambulatory Visit (HOSPITAL_BASED_OUTPATIENT_CLINIC_OR_DEPARTMENT_OTHER): Payer: Self-pay | Admitting: Physical Therapy

## 2024-05-07 ENCOUNTER — Encounter (HOSPITAL_BASED_OUTPATIENT_CLINIC_OR_DEPARTMENT_OTHER): Payer: Self-pay | Admitting: Physical Therapy

## 2024-05-07 DIAGNOSIS — M25662 Stiffness of left knee, not elsewhere classified: Secondary | ICD-10-CM

## 2024-05-07 DIAGNOSIS — M25562 Pain in left knee: Secondary | ICD-10-CM

## 2024-05-07 DIAGNOSIS — R262 Difficulty in walking, not elsewhere classified: Secondary | ICD-10-CM

## 2024-05-07 NOTE — Therapy (Signed)
 OUTPATIENT PHYSICAL THERAPY TREATMENT    Patient Name: William Savage MRN: [id number] DOB:Nov 08, 1960, 63 y.o., male Today's Date: 05/07/2024  END OF SESSION:  PT End of Session - 05/07/24 1810     Visit Number 20    Number of Visits 31    Date for Recertification  06/04/24    Authorization Type [facility]    Authorization Time Period 15 additional visits approved From 08.18.2025 - 12.16.2025    Authorization - Visit Number 5    Authorization - Number of Visits 15    PT Start Time 1232    PT Stop Time 1311    PT Time Calculation (min) 39 min    Activity Tolerance Patient tolerated treatment well    Behavior During Therapy WFL for tasks assessed/performed            Past Medical History:  Diagnosis Date   Ankle instability    bilateral   Chronic kidney disease    Diabetes (HCC)    Fatty liver    Fatty liver    Hyperlipidemia    Hypertension    IBS (irritable bowel syndrome)    Left shoulder strain    Migraines    Nonobstructive atherosclerosis of coronary artery    Plantar fasciitis    PTSD (post-traumatic stress disorder)    Radiculopathy    Sleep apnea    TIA (transient ischemic attack)    Past Surgical History:  Procedure Laterality Date   BUNIONECTOMY Right    COLONOSCOPY  2021   TOTAL KNEE ARTHROPLASTY Left 01/02/2024   Procedure: ARTHROPLASTY, KNEE, TOTAL;  Surgeon: Ernie Cough, MD;  Location: [facility];  Service: Orthopedics;  Laterality: Left;   Patient Active Problem List   Diagnosis Date Noted   S/P total knee arthroplasty, left 01/02/2024   S/P total knee replacement, left 01/02/2024   Effusion, left knee 03/27/2023   Idiopathic gout of multiple sites 03/27/2023   Radiculitis 03/27/2023   Chronic low back pain 03/27/2023   Osteoarthritis of left knee 03/27/2023   Bilateral plantar fasciitis 03/27/2023   REFERRING PROVIDER:  Ernie Cough, MD   REFERRING DIAG: [phone] (ICD-10-CM) - Presence of left artificial knee joint  S/p Lt  TKA  Rationale for Evaluation and Treatment: Rehabilitation  THERAPY DIAG:  Acute pain of left knee  Stiffness of left knee, not elsewhere classified  Difficulty in walking, not elsewhere classified  ONSET DATE: DOS 01/02/24   SUBJECTIVE:  SUBJECTIVE STATEMENT: Pt states he is doing about the same; feels stiff.    PERTINENT HISTORY:  Bilat feet N/T from time to time  PAIN:  Are you having pain? Yes: NPRS scale: 5/10 Pain location: Lt anterior knee Pain description: numb, tight Aggravating factors: sleeping is rough Relieving factors: movement/exercise  PRECAUTIONS:  None  RED FLAGS: None   WEIGHT BEARING RESTRICTIONS:  No  FALLS:  Has patient fallen in last 6 months? No  LIVING ENVIRONMENT: Stairs in home but has bedroom on first level  OCCUPATION:  retired  PLOF:  Independent  PATIENT GOALS:  Walking over a mile, yoga, upright stationary cycling   OBJECTIVE:  Note: Objective measures were completed at Evaluation unless otherwise noted.   PATIENT SURVEYS:  LEFS  Extreme difficulty/unable (0), Quite a bit of difficulty (1), Moderate difficulty (2), Little difficulty (3), No difficulty (4) Survey date:  02/21/24 03/12/24  Any of your usual work, housework or school activities 0 0  2. Usual hobbies, recreational or sporting activities 0 0  3. Getting into/out of the bath 1 1  4. Walking between rooms 1 2  5. Putting on socks/shoes 0 1  6. Squatting  0 0  7. Lifting an object, like a bag of groceries from the floor 1 3  8. Performing light activities around your home 2 2  9. Performing heavy activities around your home 0 0  10. Getting into/out of a car 1 2  11. Walking 2 blocks 0 1  12. Walking 1 mile 0 0  13. Going up/down 10 stairs (1 flight) 1 2  14. Standing for 1  hour 0 0  15.  sitting for 1 hour 1 1  16. Running on even ground 0 0  17. Running on uneven ground 0 0  18. Making sharp turns while running fast 0 0  19. Hopping  0 0  20. Rolling over in bed 1 2  Score total:  9/80 17/80    COGNITIVE STATUS: Within functional limits for tasks assessed   SENSATION: Anterior numbness  EDEMA:  Yes: mild as expected post op  GAIT: Comments: EVAL  rollator and cane available, lacking full knee ext at heel strike   Body Part #1 Knee  PALPATION: EVAL: limited scar/skin mobility around knee  LOWER EXTREMITY ROM:     Active  Left eval Left 02/28/24 Left  9/26 04/01/24 04/10/24 04/17/24 04/25/24 04/30/24 05/07/24  Knee flexion 84 95 AAROM 100 AAROM 108 AAROM 90 101 105 110 AAROM 105 after   85 supine, 105 AAROM  seated  105 AAROM  Knee extension -10  -13 -3 AAROM in supine -10 before edema massage  -7 after -12 before manual, 7 AROM after manual, 3 at end of session  -14, got to -6 resting and -3 AAROM -10, 4 AAROM  Lacking 7 AAROM   (Blank rows = not tested)   TREATMENT DATE: 05/07/24 Upright bike partial to full revolutions x 5 min Standing Lt gastroc stretch 20sec x 2 Prone Lt quad stretch with towel above knee, and strap - 30sec x 3 Manual therapy:  LAD with overpressure into Lt knee extension;  PROM into Lt knee flexion in hooklying and sitting position Long sitting Lt hamstring stretch with pt applying over pressure to above knee Standing Lt hamstring stretch with pt applying over pressure to above knee STanding Lt TKE with green band -> blue band, 5 sec hold x 10 each STM to Lt hamstrings, calf   04/30/24  STM to hamstrings/adductors along with Cupping to hamstrings/ITBScrew home mobilization  Quad set with long arc distraction  Patellar mobilizations  AP mobilization with distraction Screw home mobilization with distraction   04/25/24 SciFit bike x5 min  Screw home mobilization  Quad set with long arc distraction   Patellar mobilizations  AP mobilization with distraction STM to hamstrings/adductors along with Cupping to hamstrings/ITB  04/17/24 SciFit bike x5 min  AP mobilization with ER/ screw home mobilizations PROM flexion extension with end range holds Seated leg curl 3x10 40#, legs elevated to hyperextension   04/15/24 Recumbent bike half revs and hip hike with full revolutions 6 min Long axis distraction to knee with contract relax into flexion.  Patella mobs with movement.  IASTM to HS, and quads Step up and down off 6 step with cues for proper mechanics  - Test/ retest following IASTM Active walking between exercises to assess symptoms.    04/10/24: Recumbent bike half revs and hip hike with full revolutions 6 min Edema massage  Long axis distraction to knee with quad sets x10 AP mobilization with ER/ screw home mobilizations Contract/relax and hold/relax techniques seated/prone Prone quad set  with contralateral overpressure x5  04/08/24: Recumbent bike half revs and hip hip with full revolutions 6 min 1 flight of stairs up/ down with alternating gait pattern 1 lap around track with focus on heel to toe gait pattern.  SL ball throws x15  Hanging from straps with rocking into flexion to tolerance.  OP into extension with contract relax - pt at -10 in neutral today.  Screw home mobs Sit to stand without UE assist x5  Discussion about potential improvements and timeline  10/15  Recumbent bike half revs 5 min Leg press 125lbs with DL Leg press 24oad SL and sidelying  Knee flexion to 112 degrees in supine Knee extension contract relax reaching -10 degrees in supine Toe touch from 6 step  TKE with purple strap  STM to adductors.   04/01/24 Recumbent bike half revs 7 min Knee flexion to 108 degrees with short axis distraction  Contract relax into flexion (short sitting) and extension (supine)  Theragun to TFL and glutes, quad  TFL stretch Modified thomas  stretch HS stretch   PATIENT EDUCATION:  Education details:  exercise form/rationale Person educated: Patient Education method: Explanation, Demonstration, Tactile cues, Verbal cues,  Education comprehension: verbalized understanding, returned demonstration, verbal cues required, tactile cues required, and needs further education  HOME EXERCISE PROGRAM: Access Code: [email or web address] URL: [email or web address] Date: 02/05/2024 Prepared by: Harlene Cordon  Program Notes massage skin around knee  Exercises - Seated Hamstring Stretch  - 2-3 x daily - 7 x weekly - 2 sets - 5 breaths hold - Standing Weight Shift  - 5 x daily - 7 x weekly - Stride Stance Weight Shift  - 5 x daily - 7 x weekly - Quadruped Rocking Backward  - 2-3 x daily - 7 x weekly - 1 sets - 10 reps - Standing Gluteal Sets  - Standing Quad Set    ASSESSMENT:  CLINICAL IMPRESSION:  Manual therapy techniques utilized to increase Lt knee ROM and decrease stiffness. Lt knee ROM remains limited.  Encouraged pt to continue STM and ROM exercises at home.  Will continue to benefit from therapy to address remaining limitations. Pt making very gradual progress towards remaining goals.    Initial Subjective Patient is a 63 y.o. M who was seen today for physical therapy evaluation and treatment for s/p Lt TKA. He  is just over a month out, lacking ROM and wants to do the pool for therapy. Discussed use of aquatics and transition to land-based exercises, once he is independent in the pool, for functional workouts.  Pt is a member at [facility] so he has access to the pool. Will benefit from skilled PT to meet functional goals.     REHAB POTENTIAL: Good  CLINICAL DECISION MAKING: Stable/uncomplicated  EVALUATION COMPLEXITY: Low   GOALS: Goals reviewed with patient? Yes  SHORT TERM GOALS: Target date: 8/30  Knee ROM 0-90+ Baseline: Goal status: Partially Met 02/28/24; Met 03/19/24  2.  Demo proper heel toe gait  without cuing Baseline:  Goal status:Met 02/28/24    LONG TERM GOALS: Target date: POC date  Independent with long term exercise program in gym and pool Baseline:  Goal status: Met 03/19/24  2.  Knee ROM 0-120 Baseline:  Goal status: In progress 04/01/24  3.  Able to get in/out of chairs without UE assist, without increase in pain Baseline:  Goal status: Ongoing 04/01/24  4.  Demo SLS controlled balance for at least 10 s on surgical LE Baseline:  Goal status: Ongoing 04/01/24  PLAN:  PT FREQUENCY: 1-2x/week  PT DURATION: POC date  PLANNED INTERVENTIONS: 97164- PT Re-evaluation, 97750- Physical Performance Testing, 97110-Therapeutic exercises, 97530- Therapeutic activity, 97112- Neuromuscular re-education, 97535- Self Care, 02859- Manual therapy, [phone]- Gait training, [phone]- Aquatic Therapy, [phone] (1-2 muscles), 20561 (3+ muscles)- Dry Needling, Patient/Family education, Balance training, Stair training, Taping, Joint mobilization, Spinal mobilization, Scar mobilization, and Cryotherapy.  PLAN FOR NEXT SESSION: continue progressive LE strengthening and Lt knee ROM.   Stairs - descending; STS without abdct of LLE.   Delon Aquas, PTA 05/07/24 6:18 PM [facility] [address] [place], [place], [address] Phone: [phone]   Fax:  [phone]

## 2024-05-14 ENCOUNTER — Encounter (HOSPITAL_BASED_OUTPATIENT_CLINIC_OR_DEPARTMENT_OTHER): Admitting: Physical Therapy

## 2024-05-20 ENCOUNTER — Encounter (HOSPITAL_BASED_OUTPATIENT_CLINIC_OR_DEPARTMENT_OTHER): Payer: Self-pay | Admitting: Physical Therapy

## 2024-05-20 ENCOUNTER — Ambulatory Visit (HOSPITAL_BASED_OUTPATIENT_CLINIC_OR_DEPARTMENT_OTHER): Admitting: Physical Therapy

## 2024-05-20 DIAGNOSIS — R262 Difficulty in walking, not elsewhere classified: Secondary | ICD-10-CM | POA: Insufficient documentation

## 2024-05-20 DIAGNOSIS — M25562 Pain in left knee: Secondary | ICD-10-CM | POA: Diagnosis present

## 2024-05-20 DIAGNOSIS — M25662 Stiffness of left knee, not elsewhere classified: Secondary | ICD-10-CM | POA: Insufficient documentation

## 2024-05-20 DIAGNOSIS — M6281 Muscle weakness (generalized): Secondary | ICD-10-CM | POA: Insufficient documentation

## 2024-05-20 NOTE — Therapy (Signed)
 OUTPATIENT PHYSICAL THERAPY TREATMENT    Patient Name: William Savage MRN: [id number] DOB:1960/07/17, 63 y.o., male Today's Date: 05/20/2024  END OF SESSION:  PT End of Session - 05/20/24 1149     Visit Number 21    Number of Visits 31    Date for Recertification  06/04/24    Authorization Type [facility]    Authorization Time Period 15 additional visits approved From 08.18.2025 - 12.16.2025    Authorization - Number of Visits 15    PT Start Time 1105    PT Stop Time 1145    PT Time Calculation (min) 40 min    Activity Tolerance Patient tolerated treatment well    Behavior During Therapy WFL for tasks assessed/performed             Past Medical History:  Diagnosis Date   Ankle instability    bilateral   Chronic kidney disease    Diabetes (HCC)    Fatty liver    Fatty liver    Hyperlipidemia    Hypertension    IBS (irritable bowel syndrome)    Left shoulder strain    Migraines    Nonobstructive atherosclerosis of coronary artery    Plantar fasciitis    PTSD (post-traumatic stress disorder)    Radiculopathy    Sleep apnea    TIA (transient ischemic attack)    Past Surgical History:  Procedure Laterality Date   BUNIONECTOMY Right    COLONOSCOPY  2021   TOTAL KNEE ARTHROPLASTY Left 01/02/2024   Procedure: ARTHROPLASTY, KNEE, TOTAL;  Surgeon: Ernie Cough, MD;  Location: [facility];  Service: Orthopedics;  Laterality: Left;   Patient Active Problem List   Diagnosis Date Noted   S/P total knee arthroplasty, left 01/02/2024   S/P total knee replacement, left 01/02/2024   Effusion, left knee 03/27/2023   Idiopathic gout of multiple sites 03/27/2023   Radiculitis 03/27/2023   Chronic low back pain 03/27/2023   Osteoarthritis of left knee 03/27/2023   Bilateral plantar fasciitis 03/27/2023   REFERRING PROVIDER:  Ernie Cough, MD   REFERRING DIAG: [phone] (ICD-10-CM) - Presence of left artificial knee joint  S/p Lt TKA  Rationale for Evaluation and Treatment:  Rehabilitation  THERAPY DIAG:  Acute pain of left knee  Stiffness of left knee, not elsewhere classified  Difficulty in walking, not elsewhere classified  Muscle weakness (generalized)  ONSET DATE: DOS 01/02/24   SUBJECTIVE:  SUBJECTIVE STATEMENT: Pt reports the knee is stiff this morning and has pain into the knee. He reports it feels good and it moved well after therapy but has stiffened and tightened again.   PERTINENT HISTORY:  Bilat feet N/T from time to time  PAIN:  Are you having pain? Yes: NPRS scale: 5/10 Pain location: Lt anterior knee Pain description: numb, tight Aggravating factors: sleeping is rough Relieving factors: movement/exercise  PRECAUTIONS:  None  RED FLAGS: None   WEIGHT BEARING RESTRICTIONS:  No  FALLS:  Has patient fallen in last 6 months? No  LIVING ENVIRONMENT: Stairs in home but has bedroom on first level  OCCUPATION:  retired  PLOF:  Independent  PATIENT GOALS:  Walking over a mile, yoga, upright stationary cycling   OBJECTIVE:  Note: Objective measures were completed at Evaluation unless otherwise noted.   PATIENT SURVEYS:  LEFS  Extreme difficulty/unable (0), Quite a bit of difficulty (1), Moderate difficulty (2), Little difficulty (3), No difficulty (4) Survey date:  02/21/24 03/12/24  Any of your usual work, housework or school activities 0 0  2. Usual hobbies, recreational or sporting activities 0 0  3. Getting into/out of the bath 1 1  4. Walking between rooms 1 2  5. Putting on socks/shoes 0 1  6. Squatting  0 0  7. Lifting an object, like a bag of groceries from the floor 1 3  8. Performing light activities around your home 2 2  9. Performing heavy activities around your home 0 0  10. Getting into/out of a car 1 2  11. Walking 2  blocks 0 1  12. Walking 1 mile 0 0  13. Going up/down 10 stairs (1 flight) 1 2  14. Standing for 1 hour 0 0  15.  sitting for 1 hour 1 1  16. Running on even ground 0 0  17. Running on uneven ground 0 0  18. Making sharp turns while running fast 0 0  19. Hopping  0 0  20. Rolling over in bed 1 2  Score total:  9/80 17/80    COGNITIVE STATUS: Within functional limits for tasks assessed   SENSATION: Anterior numbness  EDEMA:  Yes: mild as expected post op  GAIT: Comments: EVAL  rollator and cane available, lacking full knee ext at heel strike   Body Part #1 Knee  PALPATION: EVAL: limited scar/skin mobility around knee  LOWER EXTREMITY ROM:     Active  Left eval Left 02/28/24 Left  9/26 04/01/24 04/10/24 04/17/24 04/25/24 04/30/24 05/07/24  Knee flexion 84 95 AAROM 100 AAROM 108 AAROM 90 101 105 110 AAROM 105 after   85 supine, 105 AAROM  seated  105 AAROM  Knee extension -10  -13 -3 AAROM in supine -10 before edema massage  -7 after -12 before manual, 7 AROM after manual, 3 at end of session  -14, got to -6 resting and -3 AAROM -10, 4 AAROM  Lacking 7 AAROM   (Blank rows = not tested)   TREATMENT DATE: 12/1  Recumbent bike 6 min (seated #15) half revolutions moving to full  TKE extension, screw home grade IV Knee flexion mob with IR (LAD with mob belt and ttable) grade IV  STM L medial HS  LLD stretching, heavy steady pressure for STM, longer duration holds using gym machines for overpressure    05/07/24 Upright bike partial to full revolutions x 5 min Standing Lt gastroc stretch 20sec x 2 Prone Lt quad stretch with towel  above knee, and strap - 30sec x 3 Manual therapy:  LAD with overpressure into Lt knee extension;  PROM into Lt knee flexion in hooklying and sitting position Long sitting Lt hamstring stretch with pt applying over pressure to above knee Standing Lt hamstring stretch with pt applying over pressure to above knee STanding Lt TKE with  green band -> blue band, 5 sec hold x 10 each STM to Lt hamstrings, calf   04/30/24 STM to hamstrings/adductors along with Cupping to hamstrings/ITBScrew home mobilization  Quad set with long arc distraction  Patellar mobilizations  AP mobilization with distraction Screw home mobilization with distraction   04/25/24 SciFit bike x5 min  Screw home mobilization  Quad set with long arc distraction  Patellar mobilizations  AP mobilization with distraction STM to hamstrings/adductors along with Cupping to hamstrings/ITB  04/17/24 SciFit bike x5 min  AP mobilization with ER/ screw home mobilizations PROM flexion extension with end range holds Seated leg curl 3x10 40#, legs elevated to hyperextension   04/15/24 Recumbent bike half revs and hip hike with full revolutions 6 min Long axis distraction to knee with contract relax into flexion.  Patella mobs with movement.  IASTM to HS, and quads Step up and down off 6 step with cues for proper mechanics  - Test/ retest following IASTM Active walking between exercises to assess symptoms.    04/10/24: Recumbent bike half revs and hip hike with full revolutions 6 min Edema massage  Long axis distraction to knee with quad sets x10 AP mobilization with ER/ screw home mobilizations Contract/relax and hold/relax techniques seated/prone Prone quad set  with contralateral overpressure x5  04/08/24: Recumbent bike half revs and hip hip with full revolutions 6 min 1 flight of stairs up/ down with alternating gait pattern 1 lap around track with focus on heel to toe gait pattern.  SL ball throws x15  Hanging from straps with rocking into flexion to tolerance.  OP into extension with contract relax - pt at -10 in neutral today.  Screw home mobs Sit to stand without UE assist x5  Discussion about potential improvements and timeline  10/15  Recumbent bike half revs 5 min Leg press 125lbs with DL Leg press 24oad SL and sidelying   Knee flexion to 112 degrees in supine Knee extension contract relax reaching -10 degrees in supine Toe touch from 6 step  TKE with purple strap  STM to adductors.   04/01/24 Recumbent bike half revs 7 min Knee flexion to 108 degrees with short axis distraction  Contract relax into flexion (short sitting) and extension (supine)  Theragun to TFL and glutes, quad  TFL stretch Modified thomas stretch HS stretch   PATIENT EDUCATION:  Education details:  exercise form/rationale Person educated: Patient Education method: Explanation, Demonstration, Tactile cues, Verbal cues,  Education comprehension: verbalized understanding, returned demonstration, verbal cues required, tactile cues required, and needs further education  HOME EXERCISE PROGRAM: Access Code: [email or web address] URL: [email or web address] Date: 02/05/2024 Prepared by: Harlene Cordon  Program Notes massage skin around knee  Exercises - Seated Hamstring Stretch  - 2-3 x daily - 7 x weekly - 2 sets - 5 breaths hold - Standing Weight Shift  - 5 x daily - 7 x weekly - Stride Stance Weight Shift  - 5 x daily - 7 x weekly - Quadruped Rocking Backward  - 2-3 x daily - 7 x weekly - 1 sets - 10 reps - Standing Gluteal Sets  - Standing Quad Set    ASSESSMENT:  CLINICAL IMPRESSION:  Pt arc of motion today 0-3-103 following manual therapy. Pt with signficant soft tissue restriction on posterior thigh and shank that limits extension ROM in addition to capsular restriction. Pt does have a firm end feel with joint mobs and advised to focus on LLLD stretching at end range in both directions to aid in progressive ROM.  Plan to continue with manual therapy and progressive flexion and ext with overpressure to aid in ROM and strength. Pt will continue to benefit from therapy to address remaining limitations. Pt making very gradual progress towards remaining goals.    Initial Subjective Patient is a 63 y.o. M who was seen  today for physical therapy evaluation and treatment for s/p Lt TKA. He is just over a month out, lacking ROM and wants to do the pool for therapy. Discussed use of aquatics and transition to land-based exercises, once he is independent in the pool, for functional workouts.  Pt is a member at [employer] so he has access to the pool. Will benefit from skilled PT to meet functional goals.     REHAB POTENTIAL: Good  CLINICAL DECISION MAKING: Stable/uncomplicated  EVALUATION COMPLEXITY: Low   GOALS: Goals reviewed with patient? Yes  SHORT TERM GOALS: Target date: 8/30  Knee ROM 0-90+ Baseline: Goal status: Partially Met 02/28/24; Met 03/19/24  2.  Demo proper heel toe gait without cuing Baseline:  Goal status:Met 02/28/24    LONG TERM GOALS: Target date: POC date  Independent with long term exercise program in gym and pool Baseline:  Goal status: Met 03/19/24  2.  Knee ROM 0-120 Baseline:  Goal status: In progress 04/01/24  3.  Able to get in/out of chairs without UE assist, without increase in pain Baseline:  Goal status: Ongoing 04/01/24  4.  Demo SLS controlled balance for at least 10 s on surgical LE Baseline:  Goal status: Ongoing 04/01/24  PLAN:  PT FREQUENCY: 1-2x/week  PT DURATION: POC date  PLANNED INTERVENTIONS: 97164- PT Re-evaluation, 97750- Physical Performance Testing, 97110-Therapeutic exercises, 97530- Therapeutic activity, 97112- Neuromuscular re-education, 97535- Self Care, 02859- Manual therapy, [phone]- Gait training, [phone]- Aquatic Therapy, [phone] (1-2 muscles), 20561 (3+ muscles)- Dry Needling, Patient/Family education, Balance training, Stair training, Taping, Joint mobilization, Spinal mobilization, Scar mobilization, and Cryotherapy.  PLAN FOR NEXT SESSION: continue progressive LE strengthening and Lt knee ROM.   Stairs - descending; STS without abdct of LLE.    Dale Call PT, DPT 05/20/24 11:52 AM

## 2024-05-23 ENCOUNTER — Encounter (HOSPITAL_BASED_OUTPATIENT_CLINIC_OR_DEPARTMENT_OTHER): Admitting: Physical Therapy

## 2024-05-27 ENCOUNTER — Encounter (HOSPITAL_BASED_OUTPATIENT_CLINIC_OR_DEPARTMENT_OTHER): Admitting: Physical Therapy

## 2024-05-29 ENCOUNTER — Encounter (HOSPITAL_BASED_OUTPATIENT_CLINIC_OR_DEPARTMENT_OTHER): Admitting: Physical Therapy

## 2024-06-03 ENCOUNTER — Ambulatory Visit (HOSPITAL_BASED_OUTPATIENT_CLINIC_OR_DEPARTMENT_OTHER): Admitting: Physical Therapy

## 2024-06-06 ENCOUNTER — Ambulatory Visit (HOSPITAL_BASED_OUTPATIENT_CLINIC_OR_DEPARTMENT_OTHER): Admitting: Physical Therapy

## 2024-06-11 ENCOUNTER — Encounter (HOSPITAL_BASED_OUTPATIENT_CLINIC_OR_DEPARTMENT_OTHER): Admitting: Physical Therapy

## 2024-06-18 ENCOUNTER — Other Ambulatory Visit (HOSPITAL_COMMUNITY): Payer: Self-pay

## 2024-06-18 ENCOUNTER — Encounter (HOSPITAL_BASED_OUTPATIENT_CLINIC_OR_DEPARTMENT_OTHER): Payer: Self-pay | Admitting: Physical Therapy

## 2024-06-18 ENCOUNTER — Ambulatory Visit (HOSPITAL_BASED_OUTPATIENT_CLINIC_OR_DEPARTMENT_OTHER): Admitting: Physical Therapy

## 2024-06-18 DIAGNOSIS — M6281 Muscle weakness (generalized): Secondary | ICD-10-CM

## 2024-06-18 DIAGNOSIS — M25562 Pain in left knee: Secondary | ICD-10-CM

## 2024-06-18 DIAGNOSIS — M25662 Stiffness of left knee, not elsewhere classified: Secondary | ICD-10-CM

## 2024-06-18 DIAGNOSIS — R262 Difficulty in walking, not elsewhere classified: Secondary | ICD-10-CM

## 2024-06-18 NOTE — Therapy (Addendum)
 " OUTPATIENT PHYSICAL THERAPY TREATMENT    Patient Name: William Savage MRN: [id number] DOB:12-30-1960, 63 y.o., male Today's Date: 06/18/2024  Progress Note   Reporting Period 02/05/24 to 06/18/24   See note below for Objective Data and Assessment of Progress/Goals    END OF SESSION:  PT End of Session - 06/18/24 1102     Visit Number 22    Number of Visits 47    Date for Recertification  08/13/24    Authorization Type [facility]    Authorization Time Period 15 additional visits approved from 06/17/24 - 03/21/25    Authorization - Visit Number 1    Authorization - Number of Visits 15    PT Start Time 1100    PT Stop Time 1140    PT Time Calculation (min) 40 min    Activity Tolerance Patient tolerated treatment well    Behavior During Therapy WFL for tasks assessed/performed             Past Medical History:  Diagnosis Date   Ankle instability    bilateral   Chronic kidney disease    Diabetes (HCC)    Fatty liver    Fatty liver    Hyperlipidemia    Hypertension    IBS (irritable bowel syndrome)    Left shoulder strain    Migraines    Nonobstructive atherosclerosis of coronary artery    Plantar fasciitis    PTSD (post-traumatic stress disorder)    Radiculopathy    Sleep apnea    TIA (transient ischemic attack)    Past Surgical History:  Procedure Laterality Date   BUNIONECTOMY Right    COLONOSCOPY  2021   TOTAL KNEE ARTHROPLASTY Left 01/02/2024   Procedure: ARTHROPLASTY, KNEE, TOTAL;  Surgeon: Ernie Cough, MD;  Location: [facility];  Service: Orthopedics;  Laterality: Left;   Patient Active Problem List   Diagnosis Date Noted   S/P total knee arthroplasty, left 01/02/2024   S/P total knee replacement, left 01/02/2024   Effusion, left knee 03/27/2023   Idiopathic gout of multiple sites 03/27/2023   Radiculitis 03/27/2023   Chronic low back pain 03/27/2023   Osteoarthritis of left knee 03/27/2023   Bilateral plantar fasciitis 03/27/2023   REFERRING  PROVIDER:  Ernie Cough, MD   REFERRING DIAG: [phone] (ICD-10-CM) - Presence of left artificial knee joint  S/p Lt TKA  Rationale for Evaluation and Treatment: Rehabilitation  THERAPY DIAG:  Acute pain of left knee  Stiffness of left knee, not elsewhere classified  Difficulty in walking, not elsewhere classified  Muscle weakness (generalized)  ONSET DATE: DOS 01/02/24   SUBJECTIVE:  SUBJECTIVE STATEMENT: Pt reports continued knee stiffness. About 70% functional status. Remains limited by stiffness. HEP going well. Back has been bothering him more over the last month. Sitting tends to aggravate symptoms and so does limping on LLE.   PERTINENT HISTORY:  Bilat feet N/T from time to time  PAIN:  Are you having pain? Yes: NPRS scale: 5/10 Pain location: Lt anterior knee Pain description: numb, tight Aggravating factors: sleeping is rough Relieving factors: movement/exercise  PRECAUTIONS:  None  RED FLAGS: None   WEIGHT BEARING RESTRICTIONS:  No  FALLS:  Has patient fallen in last 6 months? No  LIVING ENVIRONMENT: Stairs in home but has bedroom on first level  OCCUPATION:  retired  PLOF:  Independent  PATIENT GOALS:  Walking over a mile, yoga, upright stationary cycling   OBJECTIVE:  Note: Objective measures were completed at Evaluation unless otherwise noted.   PATIENT SURVEYS:  LEFS  Extreme difficulty/unable (0), Quite a bit of difficulty (1), Moderate difficulty (2), Little difficulty (3), No difficulty (4) Survey date:  02/21/24 03/12/24 06/18/24  Any of your usual work, housework or school activities 0 0   2. Usual hobbies, recreational or sporting activities 0 0   3. Getting into/out of the bath 1 1   4. Walking between rooms 1 2   5. Putting on socks/shoes 0 1    6. Squatting  0 0   7. Lifting an object, like a bag of groceries from the floor 1 3   8. Performing light activities around your home 2 2   9. Performing heavy activities around your home 0 0   10. Getting into/out of a car 1 2   11. Walking 2 blocks 0 1   12. Walking 1 mile 0 0   13. Going up/down 10 stairs (1 flight) 1 2   14. Standing for 1 hour 0 0   15.  sitting for 1 hour 1 1   16. Running on even ground 0 0   17. Running on uneven ground 0 0   18. Making sharp turns while running fast 0 0   19. Hopping  0 0   20. Rolling over in bed 1 2   Score total:  9/80 17/80     COGNITIVE STATUS: Within functional limits for tasks assessed   SENSATION: Anterior numbness  EDEMA:  Yes: mild as expected post op  GAIT: Comments: EVAL  rollator and cane available, lacking full knee ext at heel strike   Body Part #1 Knee  PALPATION: EVAL: limited scar/skin mobility around knee  LOWER EXTREMITY ROM:     Active  Left eval Left 02/28/24 Left  9/26 04/01/24 04/10/24 04/17/24 04/25/24 04/30/24 05/07/24 06/18/24  Knee flexion 84 95 AAROM 100 AAROM 108 AAROM 90 101 105 110 AAROM 105 after   85 supine, 105 AAROM  seated  105 AAROM 102 AAROM Improves to 105  Knee extension -10  -13 -3 AAROM in supine -10 before edema massage  -7 after -12 before manual, 7 AROM after manual, 3 at end of session  -14, got to -6 resting and -3 AAROM -10, 4 AAROM  Lacking 7 AAROM  Lacking 7 AAROM Improves to lacking 6   (Blank rows = not tested)  Reassessment 06/18/24 Stairs: 7 inch alternating, increased effort required with ascending, decreased strength and motor control descending, lacking TKE on L Gait: ambulates lacking TKE    TREATMENT DATE: 06/18/24 Recumbent bike 6 min (seated #15) half revolutions moving to  full Reassessment  Manual: patellar mobs, STM adductors, hamstrings, TKE extension, screw home grade IV; LAD with quad set   12/1  Recumbent bike 6 min (seated #15) half  revolutions moving to full  TKE extension, screw home grade IV Knee flexion mob with IR (LAD with mob belt and ttable) grade IV  STM L medial HS  LLD stretching, heavy steady pressure for STM, longer duration holds using gym machines for overpressure    05/07/24 Upright bike partial to full revolutions x 5 min Standing Lt gastroc stretch 20sec x 2 Prone Lt quad stretch with towel above knee, and strap - 30sec x 3 Manual therapy:  LAD with overpressure into Lt knee extension;  PROM into Lt knee flexion in hooklying and sitting position Long sitting Lt hamstring stretch with pt applying over pressure to above knee Standing Lt hamstring stretch with pt applying over pressure to above knee STanding Lt TKE with green band -> blue band, 5 sec hold x 10 each STM to Lt hamstrings, calf   04/30/24 STM to hamstrings/adductors along with Cupping to hamstrings/ITBScrew home mobilization  Quad set with long arc distraction  Patellar mobilizations  AP mobilization with distraction Screw home mobilization with distraction   04/25/24 SciFit bike x5 min  Screw home mobilization  Quad set with long arc distraction  Patellar mobilizations  AP mobilization with distraction STM to hamstrings/adductors along with Cupping to hamstrings/ITB  04/17/24 SciFit bike x5 min  AP mobilization with ER/ screw home mobilizations PROM flexion extension with end range holds Seated leg curl 3x10 40#, legs elevated to hyperextension   04/15/24 Recumbent bike half revs and hip hike with full revolutions 6 min Long axis distraction to knee with contract relax into flexion.  Patella mobs with movement.  IASTM to HS, and quads Step up and down off 6 step with cues for proper mechanics  - Test/ retest following IASTM Active walking between exercises to assess symptoms.    04/10/24: Recumbent bike half revs and hip hike with full revolutions 6 min Edema massage  Long axis distraction to knee with quad  sets x10 AP mobilization with ER/ screw home mobilizations Contract/relax and hold/relax techniques seated/prone Prone quad set  with contralateral overpressure x5  04/08/24: Recumbent bike half revs and hip hip with full revolutions 6 min 1 flight of stairs up/ down with alternating gait pattern 1 lap around track with focus on heel to toe gait pattern.  SL ball throws x15  Hanging from straps with rocking into flexion to tolerance.  OP into extension with contract relax - pt at -10 in neutral today.  Screw home mobs Sit to stand without UE assist x5  Discussion about potential improvements and timeline  10/15  Recumbent bike half revs 5 min Leg press 125lbs with DL Leg press 24oad SL and sidelying  Knee flexion to 112 degrees in supine Knee extension contract relax reaching -10 degrees in supine Toe touch from 6 step  TKE with purple strap  STM to adductors.   04/01/24 Recumbent bike half revs 7 min Knee flexion to 108 degrees with short axis distraction  Contract relax into flexion (short sitting) and extension (supine)  Theragun to TFL and glutes, quad  TFL stretch Modified thomas stretch HS stretch   PATIENT EDUCATION:  Education details:  exercise form/rationale Person educated: Patient Education method: Explanation, Demonstration, Tactile cues, Verbal cues,  Education comprehension: verbalized understanding, returned demonstration, verbal cues required, tactile cues required, and needs further education  HOME  EXERCISE PROGRAM: Access Code: [email or web address] URL: [email or web address] Date: 02/05/2024 Prepared by: Harlene Cordon  Program Notes massage skin around knee  Exercises - Seated Hamstring Stretch  - 2-3 x daily - 7 x weekly - 2 sets - 5 breaths hold - Standing Weight Shift  - 5 x daily - 7 x weekly - Stride Stance Weight Shift  - 5 x daily - 7 x weekly - Quadruped Rocking Backward  - 2-3 x daily - 7 x weekly - 1 sets - 10 reps -  Standing Gluteal Sets  - Standing Quad Set    ASSESSMENT:  CLINICAL IMPRESSION: Patient has met 1/2 short term goals and 1/4 long term goals with ability to complete HEP and improvement in symptoms, strength, and functional mobility. Other short term goal partially met with improving ROM. Remaining goals not met due to continued deficits in symptoms, ROM, strength, activity tolerance, gait, balance. Patient has made good progress toward remaining goals. Extending POC 1-2x/week for 8 weeks. Patient will continue to benefit from skilled physical therapy in order to improve function and reduce impairment.     Initial Subjective Patient is a 63 y.o. M who was seen today for physical therapy evaluation and treatment for s/p Lt TKA. He is just over a month out, lacking ROM and wants to do the pool for therapy. Discussed use of aquatics and transition to land-based exercises, once he is independent in the pool, for functional workouts.  Pt is a member at [facility] so he has access to the pool. Will benefit from skilled PT to meet functional goals.     REHAB POTENTIAL: Good  CLINICAL DECISION MAKING: Stable/uncomplicated  EVALUATION COMPLEXITY: Low   GOALS: Goals reviewed with patient? Yes  SHORT TERM GOALS: Target date: 8/30  Knee ROM 0-90+ Baseline: Goal status: Partially Met 02/28/24; Met 03/19/24  2.  Demo proper heel toe gait without cuing Baseline:  Goal status:Met 02/28/24    LONG TERM GOALS: Target date: POC date  Independent with long term exercise program in gym and pool Baseline:  Goal status: Met 03/19/24  2.  Knee ROM 0-120 Baseline:  Goal status: In progress 04/01/24  3.  Able to get in/out of chairs without UE assist, without increase in pain Baseline:  Goal status: Ongoing 04/01/24  4.  Demo SLS controlled balance for at least 10 s on surgical LE Baseline:  Goal status: Ongoing 04/01/24  PLAN:  PT FREQUENCY: 1-2x/week  PT DURATION: POC date  PLANNED  INTERVENTIONS: 97164- PT Re-evaluation, 97750- Physical Performance Testing, 97110-Therapeutic exercises, 97530- Therapeutic activity, 97112- Neuromuscular re-education, 97535- Self Care, 02859- Manual therapy, [phone]- Gait training, [phone]- Aquatic Therapy, [phone] (1-2 muscles), 20561 (3+ muscles)- Dry Needling, Patient/Family education, Balance training, Stair training, Taping, Joint mobilization, Spinal mobilization, Scar mobilization, and Cryotherapy.  PLAN FOR NEXT SESSION: continue progressive LE strengthening and Lt knee ROM.   Stairs - descending; STS without abdct of LLE.     William Savage, PT 06/18/2024, 3:51 PM    "

## 2024-06-18 NOTE — Addendum Note (Signed)
 Addended by: Ashaya Raftery S on: 06/18/2024 03:53 PM   Modules accepted: Orders

## 2024-06-26 ENCOUNTER — Encounter (HOSPITAL_BASED_OUTPATIENT_CLINIC_OR_DEPARTMENT_OTHER): Payer: Self-pay | Admitting: Physical Therapy

## 2024-06-26 ENCOUNTER — Ambulatory Visit (HOSPITAL_BASED_OUTPATIENT_CLINIC_OR_DEPARTMENT_OTHER): Attending: Orthopedic Surgery | Admitting: Physical Therapy

## 2024-06-26 DIAGNOSIS — M25561 Pain in right knee: Secondary | ICD-10-CM | POA: Insufficient documentation

## 2024-06-26 DIAGNOSIS — G8929 Other chronic pain: Secondary | ICD-10-CM | POA: Insufficient documentation

## 2024-06-26 DIAGNOSIS — M5459 Other low back pain: Secondary | ICD-10-CM | POA: Insufficient documentation

## 2024-06-26 DIAGNOSIS — M25562 Pain in left knee: Secondary | ICD-10-CM | POA: Insufficient documentation

## 2024-06-26 DIAGNOSIS — M25662 Stiffness of left knee, not elsewhere classified: Secondary | ICD-10-CM | POA: Insufficient documentation

## 2024-06-26 DIAGNOSIS — M6281 Muscle weakness (generalized): Secondary | ICD-10-CM | POA: Insufficient documentation

## 2024-06-26 DIAGNOSIS — R262 Difficulty in walking, not elsewhere classified: Secondary | ICD-10-CM | POA: Insufficient documentation

## 2024-06-26 NOTE — Therapy (Signed)
 " OUTPATIENT PHYSICAL THERAPY TREATMENT    Patient Name: Merville Hijazi MRN: [id number] DOB:March 16, 1961, 64 y.o., male Today's Date: 06/26/2024    END OF SESSION:  PT End of Session - 06/26/24 1023     Visit Number 23    Number of Visits 47    Date for Recertification  08/13/24    Authorization Type [facility]    Authorization Time Period 14 additional visits approved from 05/20/24-07/05/24    Authorization - Visit Number 3    Authorization - Number of Visits 14    PT Start Time 0930    PT Stop Time 1014    PT Time Calculation (min) 44 min    Activity Tolerance Patient tolerated treatment well    Behavior During Therapy WFL for tasks assessed/performed              Past Medical History:  Diagnosis Date   Ankle instability    bilateral   Chronic kidney disease    Diabetes (HCC)    Fatty liver    Fatty liver    Hyperlipidemia    Hypertension    IBS (irritable bowel syndrome)    Left shoulder strain    Migraines    Nonobstructive atherosclerosis of coronary artery    Plantar fasciitis    PTSD (post-traumatic stress disorder)    Radiculopathy    Sleep apnea    TIA (transient ischemic attack)    Past Surgical History:  Procedure Laterality Date   BUNIONECTOMY Right    COLONOSCOPY  2021   TOTAL KNEE ARTHROPLASTY Left 01/02/2024   Procedure: ARTHROPLASTY, KNEE, TOTAL;  Surgeon: Ernie Cough, MD;  Location: [facility];  Service: Orthopedics;  Laterality: Left;   Patient Active Problem List   Diagnosis Date Noted   S/P total knee arthroplasty, left 01/02/2024   S/P total knee replacement, left 01/02/2024   Effusion, left knee 03/27/2023   Idiopathic gout of multiple sites 03/27/2023   Radiculitis 03/27/2023   Chronic low back pain 03/27/2023   Osteoarthritis of left knee 03/27/2023   Bilateral plantar fasciitis 03/27/2023   REFERRING PROVIDER:  Ernie Cough, MD   REFERRING DIAG: [phone] (ICD-10-CM) - Presence of left artificial knee joint  S/p Lt  TKA  Rationale for Evaluation and Treatment: Rehabilitation  THERAPY DIAG:  Acute pain of left knee  Stiffness of left knee, not elsewhere classified  Difficulty in walking, not elsewhere classified  Muscle weakness (generalized)  Chronic pain of both knees  Other low back pain  Muscle weakness-general  ONSET DATE: DOS 01/02/24   SUBJECTIVE:  SUBJECTIVE STATEMENT: I am having a lot of stiffness when I wake up.   PERTINENT HISTORY:  Bilat feet N/T from time to time  PAIN:  Are you having pain? Yes: NPRS scale: 5/10 Pain location: Lt anterior knee Pain description: numb, tight Aggravating factors: sleeping is rough Relieving factors: movement/exercise  PRECAUTIONS:  None  RED FLAGS: None   WEIGHT BEARING RESTRICTIONS:  No  FALLS:  Has patient fallen in last 6 months? No  LIVING ENVIRONMENT: Stairs in home but has bedroom on first level  OCCUPATION:  retired  PLOF:  Independent  PATIENT GOALS:  Walking over a mile, yoga, upright stationary cycling   OBJECTIVE:  Note: Objective measures were completed at Evaluation unless otherwise noted.   PATIENT SURVEYS:  LEFS  Extreme difficulty/unable (0), Quite a bit of difficulty (1), Moderate difficulty (2), Little difficulty (3), No difficulty (4) Survey date:  02/21/24 03/12/24 06/18/24  Any of your usual work, housework or school activities 0 0   2. Usual hobbies, recreational or sporting activities 0 0   3. Getting into/out of the bath 1 1   4. Walking between rooms 1 2   5. Putting on socks/shoes 0 1   6. Squatting  0 0   7. Lifting an object, like a bag of groceries from the floor 1 3   8. Performing light activities around your home 2 2   9. Performing heavy activities around your home 0 0   10. Getting into/out of a  car 1 2   11. Walking 2 blocks 0 1   12. Walking 1 mile 0 0   13. Going up/down 10 stairs (1 flight) 1 2   14. Standing for 1 hour 0 0   15.  sitting for 1 hour 1 1   16. Running on even ground 0 0   17. Running on uneven ground 0 0   18. Making sharp turns while running fast 0 0   19. Hopping  0 0   20. Rolling over in bed 1 2   Score total:  9/80 17/80     COGNITIVE STATUS: Within functional limits for tasks assessed   SENSATION: Anterior numbness  EDEMA:  Yes: mild as expected post op  GAIT: Comments: EVAL  rollator and cane available, lacking full knee ext at heel strike   Body Part #1 Knee  PALPATION: EVAL: limited scar/skin mobility around knee  LOWER EXTREMITY ROM:     Active  Left eval Left 02/28/24 Left  9/26 04/01/24 04/10/24 04/17/24 04/25/24 04/30/24 05/07/24 06/18/24  Knee flexion 84 95 AAROM 100 AAROM 108 AAROM 90 101 105 110 AAROM 105 after   85 supine, 105 AAROM  seated  105 AAROM 102 AAROM Improves to 105  Knee extension -10  -13 -3 AAROM in supine -10 before edema massage  -7 after -12 before manual, 7 AROM after manual, 3 at end of session  -14, got to -6 resting and -3 AAROM -10, 4 AAROM  Lacking 7 AAROM  Lacking 7 AAROM Improves to lacking 6   (Blank rows = not tested)  Reassessment 06/18/24 Stairs: 7 inch alternating, increased effort required with ascending, decreased strength and motor control descending, lacking TKE on L Gait: ambulates lacking TKE    TREATMENT DATE: 06/26/24 Recumbent bike 6 min (seated #15) half revolutions moving to full Long axis distraction with Quad contraction Short sitting with OP into flexion Measurements throughout session starting at -12 extension, Reaching -8 in supine and -6  in standing with quad contraction Prone laying for 3 min with 7.5lb weight on ankle Prone laying with IASTYM to medial HS  Leg extension with increased weight with cues to maintain quad contraction with PT assist into  extension Patella mobs Contract relax into ext with OP   06/18/24 Recumbent bike 6 min (seated #15) half revolutions moving to full Reassessment  Manual: patellar mobs, STM adductors, hamstrings, TKE extension, screw home grade IV; LAD with quad set   12/1  Recumbent bike 6 min (seated #15) half revolutions moving to full  TKE extension, screw home grade IV Knee flexion mob with IR (LAD with mob belt and ttable) grade IV  STM L medial HS  LLD stretching, heavy steady pressure for STM, longer duration holds using gym machines for overpressure    05/07/24 Upright bike partial to full revolutions x 5 min Standing Lt gastroc stretch 20sec x 2 Prone Lt quad stretch with towel above knee, and strap - 30sec x 3 Manual therapy:  LAD with overpressure into Lt knee extension;  PROM into Lt knee flexion in hooklying and sitting position Long sitting Lt hamstring stretch with pt applying over pressure to above knee Standing Lt hamstring stretch with pt applying over pressure to above knee STanding Lt TKE with green band -> blue band, 5 sec hold x 10 each STM to Lt hamstrings, calf   04/30/24 STM to hamstrings/adductors along with Cupping to hamstrings/ITBScrew home mobilization  Quad set with long arc distraction  Patellar mobilizations  AP mobilization with distraction Screw home mobilization with distraction   04/25/24 SciFit bike x5 min  Screw home mobilization  Quad set with long arc distraction  Patellar mobilizations  AP mobilization with distraction STM to hamstrings/adductors along with Cupping to hamstrings/ITB  04/17/24 SciFit bike x5 min  AP mobilization with ER/ screw home mobilizations PROM flexion extension with end range holds Seated leg curl 3x10 40#, legs elevated to hyperextension   04/15/24 Recumbent bike half revs and hip hike with full revolutions 6 min Long axis distraction to knee with contract relax into flexion.  Patella mobs with movement.   IASTM to HS, and quads Step up and down off 6 step with cues for proper mechanics  - Test/ retest following IASTM Active walking between exercises to assess symptoms.    04/10/24: Recumbent bike half revs and hip hike with full revolutions 6 min Edema massage  Long axis distraction to knee with quad sets x10 AP mobilization with ER/ screw home mobilizations Contract/relax and hold/relax techniques seated/prone Prone quad set  with contralateral overpressure x5  04/08/24: Recumbent bike half revs and hip hip with full revolutions 6 min 1 flight of stairs up/ down with alternating gait pattern 1 lap around track with focus on heel to toe gait pattern.  SL ball throws x15  Hanging from straps with rocking into flexion to tolerance.  OP into extension with contract relax - pt at -10 in neutral today.  Screw home mobs Sit to stand without UE assist x5  Discussion about potential improvements and timeline  10/15  Recumbent bike half revs 5 min Leg press 125lbs with DL Leg press 24oad SL and sidelying  Knee flexion to 112 degrees in supine Knee extension contract relax reaching -10 degrees in supine Toe touch from 6 step  TKE with purple strap  STM to adductors.   04/01/24 Recumbent bike half revs 7 min Knee flexion to 108 degrees with short axis distraction  Contract relax into flexion (  short sitting) and extension (supine)  Theragun to TFL and glutes, quad  TFL stretch Modified thomas stretch HS stretch   PATIENT EDUCATION:  Education details:  exercise form/rationale Person educated: Patient Education method: Explanation, Demonstration, Tactile cues, Verbal cues,  Education comprehension: verbalized understanding, returned demonstration, verbal cues required, tactile cues required, and needs further education  HOME EXERCISE PROGRAM: Access Code: [email or web address] URL: [email or web address] Date: 02/05/2024 Prepared by: Harlene Cordon  Program  Notes massage skin around knee  Exercises - Seated Hamstring Stretch  - 2-3 x daily - 7 x weekly - 2 sets - 5 breaths hold - Standing Weight Shift  - 5 x daily - 7 x weekly - Stride Stance Weight Shift  - 5 x daily - 7 x weekly - Quadruped Rocking Backward  - 2-3 x daily - 7 x weekly - 1 sets - 10 reps - Standing Gluteal Sets  - Standing Quad Set    ASSESSMENT:  CLINICAL IMPRESSION: Tolerated session well. Continues to remain limited into flex/ ext. Discussed outcomes of a manipulation if pt doesn't achieve goals of full functional ROM. He states that he would like to give it a year before making any further decisions. He continues to have most limitations in his medial HS. Pt will continue to benefit from skilled PT to address continued deficits.      Initial Subjective Patient is a 64 y.o. M who was seen today for physical therapy evaluation and treatment for s/p Lt TKA. He is just over a month out, lacking ROM and wants to do the pool for therapy. Discussed use of aquatics and transition to land-based exercises, once he is independent in the pool, for functional workouts.  Pt is a member at [facility] so he has access to the pool. Will benefit from skilled PT to meet functional goals.     REHAB POTENTIAL: Good  CLINICAL DECISION MAKING: Stable/uncomplicated  EVALUATION COMPLEXITY: Low   GOALS: Goals reviewed with patient? Yes  SHORT TERM GOALS: Target date: 8/30  Knee ROM 0-90+ Baseline: Goal status: Partially Met 02/28/24; Met 03/19/24  2.  Demo proper heel toe gait without cuing Baseline:  Goal status:Met 02/28/24    LONG TERM GOALS: Target date: POC date  Independent with long term exercise program in gym and pool Baseline:  Goal status: Met 03/19/24  2.  Knee ROM 0-120 Baseline:  Goal status: In progress 04/01/24  3.  Able to get in/out of chairs without UE assist, without increase in pain Baseline:  Goal status: Ongoing 04/01/24  4.  Demo SLS controlled  balance for at least 10 s on surgical LE Baseline:  Goal status: Ongoing 04/01/24  PLAN:  PT FREQUENCY: 1-2x/week  PT DURATION: POC date  PLANNED INTERVENTIONS: 97164- PT Re-evaluation, 97750- Physical Performance Testing, 97110-Therapeutic exercises, 97530- Therapeutic activity, 97112- Neuromuscular re-education, 97535- Self Care, 02859- Manual therapy, [phone]- Gait training, [phone]- Aquatic Therapy, [phone] (1-2 muscles), 20561 (3+ muscles)- Dry Needling, Patient/Family education, Balance training, Stair training, Taping, Joint mobilization, Spinal mobilization, Scar mobilization, and Cryotherapy.  PLAN FOR NEXT SESSION: continue progressive LE strengthening and Lt knee ROM.   Stairs - descending; STS without abdct of LLE.     Rojean JONELLE Batten, PT 06/26/2024, 11:50 AM    "

## 2024-07-03 ENCOUNTER — Encounter (HOSPITAL_BASED_OUTPATIENT_CLINIC_OR_DEPARTMENT_OTHER): Payer: Self-pay

## 2024-07-03 ENCOUNTER — Ambulatory Visit (HOSPITAL_BASED_OUTPATIENT_CLINIC_OR_DEPARTMENT_OTHER): Payer: Self-pay

## 2024-07-03 DIAGNOSIS — M25562 Pain in left knee: Secondary | ICD-10-CM

## 2024-07-03 DIAGNOSIS — M25662 Stiffness of left knee, not elsewhere classified: Secondary | ICD-10-CM

## 2024-07-03 NOTE — Therapy (Signed)
 " OUTPATIENT PHYSICAL THERAPY TREATMENT    Patient Name: William Savage MRN: [id number] DOB:1961-02-21, 64 y.o., male Today's Date: 07/03/2024    END OF SESSION:  PT End of Session - 07/03/24 1347     Visit Number 24    Number of Visits 47    Date for Recertification  08/13/24    Authorization Type [facility]    Authorization Time Period 14 additional visits approved from 05/20/24-07/05/24    Authorization - Visit Number 4    Authorization - Number of Visits 14    PT Start Time 1345    PT Stop Time 1430    PT Time Calculation (min) 45 min    Activity Tolerance Patient tolerated treatment well    Behavior During Therapy WFL for tasks assessed/performed               Past Medical History:  Diagnosis Date   Ankle instability    bilateral   Chronic kidney disease    Diabetes (HCC)    Fatty liver    Fatty liver    Hyperlipidemia    Hypertension    IBS (irritable bowel syndrome)    Left shoulder strain    Migraines    Nonobstructive atherosclerosis of coronary artery    Plantar fasciitis    PTSD (post-traumatic stress disorder)    Radiculopathy    Sleep apnea    TIA (transient ischemic attack)    Past Surgical History:  Procedure Laterality Date   BUNIONECTOMY Right    COLONOSCOPY  2021   TOTAL KNEE ARTHROPLASTY Left 01/02/2024   Procedure: ARTHROPLASTY, KNEE, TOTAL;  Surgeon: Ernie Cough, MD;  Location: [facility];  Service: Orthopedics;  Laterality: Left;   Patient Active Problem List   Diagnosis Date Noted   S/P total knee arthroplasty, left 01/02/2024   S/P total knee replacement, left 01/02/2024   Effusion, left knee 03/27/2023   Idiopathic gout of multiple sites 03/27/2023   Radiculitis 03/27/2023   Chronic low back pain 03/27/2023   Osteoarthritis of left knee 03/27/2023   Bilateral plantar fasciitis 03/27/2023   REFERRING PROVIDER:  Ernie Cough, MD   REFERRING DIAG: [phone] (ICD-10-CM) - Presence of left artificial knee joint  S/p Lt  TKA  Rationale for Evaluation and Treatment: Rehabilitation  THERAPY DIAG:  Acute pain of left knee  Stiffness of left knee, not elsewhere classified  ONSET DATE: DOS 01/02/24   SUBJECTIVE:  SUBJECTIVE STATEMENT: Pt with ongoing knee stiffness. Not too sore after PT sessions.   PERTINENT HISTORY:  Bilat feet N/T from time to time  PAIN:  Are you having pain? Yes: NPRS scale: 6/10 Pain location: Lt anterior knee Pain description: numb, tight Aggravating factors: sleeping is rough Relieving factors: movement/exercise  PRECAUTIONS:  None  RED FLAGS: None   WEIGHT BEARING RESTRICTIONS:  No  FALLS:  Has patient fallen in last 6 months? No  LIVING ENVIRONMENT: Stairs in home but has bedroom on first level  OCCUPATION:  retired  PLOF:  Independent  PATIENT GOALS:  Walking over a mile, yoga, upright stationary cycling   OBJECTIVE:  Note: Objective measures were completed at Evaluation unless otherwise noted.   PATIENT SURVEYS:  LEFS  Extreme difficulty/unable (0), Quite a bit of difficulty (1), Moderate difficulty (2), Little difficulty (3), No difficulty (4) Survey date:  02/21/24 03/12/24 06/18/24  Any of your usual work, housework or school activities 0 0   2. Usual hobbies, recreational or sporting activities 0 0   3. Getting into/out of the bath 1 1   4. Walking between rooms 1 2   5. Putting on socks/shoes 0 1   6. Squatting  0 0   7. Lifting an object, like a bag of groceries from the floor 1 3   8. Performing light activities around your home 2 2   9. Performing heavy activities around your home 0 0   10. Getting into/out of a car 1 2   11. Walking 2 blocks 0 1   12. Walking 1 mile 0 0   13. Going up/down 10 stairs (1 flight) 1 2   14. Standing for 1 hour 0 0   15.   sitting for 1 hour 1 1   16. Running on even ground 0 0   17. Running on uneven ground 0 0   18. Making sharp turns while running fast 0 0   19. Hopping  0 0   20. Rolling over in bed 1 2   Score total:  9/80 17/80     COGNITIVE STATUS: Within functional limits for tasks assessed   SENSATION: Anterior numbness  EDEMA:  Yes: mild as expected post op  GAIT: Comments: EVAL  rollator and cane available, lacking full knee ext at heel strike   Body Part #1 Knee  PALPATION: EVAL: limited scar/skin mobility around knee  LOWER EXTREMITY ROM:     Active  Left eval Left 02/28/24 Left  9/26 04/01/24 04/10/24 04/17/24 04/25/24 04/30/24 05/07/24 06/18/24 07/03/24  Knee flexion 84 95 AAROM 100 AAROM 108 AAROM 90 101 105 110 AAROM 105 after   85 supine, 105 AAROM  seated  105 AAROM 102 AAROM Improves to 105 102 passive  Knee extension -10  -13 -3 AAROM in supine -10 before edema massage  -7 after -12 before manual, 7 AROM after manual, 3 at end of session  -14, got to -6 resting and -3 AAROM -10, 4 AAROM  Lacking 7 AAROM  Lacking 7 AAROM Improves to lacking 6    (Blank rows = not tested)  Reassessment 06/18/24 Stairs: 7 inch alternating, increased effort required with ascending, decreased strength and motor control descending, lacking TKE on L Gait: ambulates lacking TKE    TREATMENT DATE:  07/03/24 Recumbent bike 6 min (seated #15) half revolutions moving to full P ROM left knee flexion with overpressure Heel prop with manual overpressure Patellar mobilizations Tibiofemoral mobilizations with posterior glide Roller  to distal quad Prone knee flexion passive measured 102 knee flexion passive Squats with hold at endrange 5 seconds x 5 Quadruped kneeling on plinth 15 seconds x 5 HEP update and review  06/26/24 Recumbent bike 6 min (seated #15) half revolutions moving to full Long axis distraction with Quad contraction Short sitting with OP into flexion Measurements  throughout session starting at -12 extension, Reaching -8 in supine and -6 in standing with quad contraction Prone laying for 3 min with 7.5lb weight on ankle Prone laying with IASTYM to medial HS  Leg extension with increased weight with cues to maintain quad contraction with PT assist into extension Patella mobs Contract relax into ext with OP   06/18/24 Recumbent bike 6 min (seated #15) half revolutions moving to full Reassessment  Manual: patellar mobs, STM adductors, hamstrings, TKE extension, screw home grade IV; LAD with quad set   12/1  Recumbent bike 6 min (seated #15) half revolutions moving to full  TKE extension, screw home grade IV Knee flexion mob with IR (LAD with mob belt and ttable) grade IV  STM L medial HS  LLD stretching, heavy steady pressure for STM, longer duration holds using gym machines for overpressure     PATIENT EDUCATION:  Education details:  exercise form/rationale Person educated: Patient Education method: Explanation, Demonstration, Tactile cues, Verbal cues,  Education comprehension: verbalized understanding, returned demonstration, verbal cues required, tactile cues required, and needs further education  HOME EXERCISE PROGRAM: Access Code: [email or web address] URL: [email or web address] Date: 02/05/2024 Prepared by: Harlene Cordon  Program Notes massage skin around knee  Exercises - Seated Hamstring Stretch  - 2-3 x daily - 7 x weekly - 2 sets - 5 breaths hold - Standing Weight Shift  - 5 x daily - 7 x weekly - Stride Stance Weight Shift  - 5 x daily - 7 x weekly - Quadruped Rocking Backward  - 2-3 x daily - 7 x weekly - 1 sets - 10 reps - Standing Gluteal Sets  - Standing Quad Set    ASSESSMENT:  CLINICAL IMPRESSION: Continued to focus on left knee range of motion especially flexion.  Continued with aggressive mobilizations and overpressure to improve range of motion.  Patient instructed in how to improve at home.   Updated HEP to include squats and kneeling exercise to improve knee flexion.  patient was measured at 102 degrees flexion passively today.  Instructed patient to continue to use ice at home to manage swelling inflammation prn.  Will continue to focus on knee flexion and extension in clinic.    Initial Subjective Patient is a 64 y.o. M who was seen today for physical therapy evaluation and treatment for s/p Lt TKA. He is just over a month out, lacking ROM and wants to do the pool for therapy. Discussed use of aquatics and transition to land-based exercises, once he is independent in the pool, for functional workouts.  Pt is a member at [employer] so he has access to the pool. Will benefit from skilled PT to meet functional goals.     REHAB POTENTIAL: Good  CLINICAL DECISION MAKING: Stable/uncomplicated  EVALUATION COMPLEXITY: Low   GOALS: Goals reviewed with patient? Yes  SHORT TERM GOALS: Target date: 8/30  Knee ROM 0-90+ Baseline: Goal status: Partially Met 02/28/24; Met 03/19/24  2.  Demo proper heel toe gait without cuing Baseline:  Goal status:Met 02/28/24    LONG TERM GOALS: Target date: POC date  Independent with long term exercise program in gym and pool Baseline:  Goal status: Met 03/19/24  2.  Knee ROM 0-120 Baseline:  Goal status: In progress 04/01/24  3.  Able to get in/out of chairs without UE assist, without increase in pain Baseline:  Goal status: Ongoing 04/01/24  4.  Demo SLS controlled balance for at least 10 s on surgical LE Baseline:  Goal status: Ongoing 04/01/24  PLAN:  PT FREQUENCY: 1-2x/week  PT DURATION: POC date  PLANNED INTERVENTIONS: 97164- PT Re-evaluation, 97750- Physical Performance Testing, 97110-Therapeutic exercises, 97530- Therapeutic activity, 97112- Neuromuscular re-education, 97535- Self Care, 02859- Manual therapy, [phone]- Gait training, [phone]- Aquatic Therapy, [phone] (1-2 muscles), 20561 (3+ muscles)- Dry Needling, Patient/Family  education, Balance training, Stair training, Taping, Joint mobilization, Spinal mobilization, Scar mobilization, and Cryotherapy.  PLAN FOR NEXT SESSION: continue progressive LE strengthening and Lt knee ROM.   Stairs - descending; STS without abdct of LLE.     Asberry BRAVO Heath Tesler, PTA 07/03/2024, 3:56 PM    "

## 2024-07-04 ENCOUNTER — Encounter (HOSPITAL_BASED_OUTPATIENT_CLINIC_OR_DEPARTMENT_OTHER): Payer: Self-pay | Admitting: Physical Therapy

## 2024-07-04 ENCOUNTER — Ambulatory Visit (HOSPITAL_BASED_OUTPATIENT_CLINIC_OR_DEPARTMENT_OTHER): Payer: Self-pay | Admitting: Physical Therapy

## 2024-07-04 DIAGNOSIS — R262 Difficulty in walking, not elsewhere classified: Secondary | ICD-10-CM

## 2024-07-04 DIAGNOSIS — M25662 Stiffness of left knee, not elsewhere classified: Secondary | ICD-10-CM

## 2024-07-04 DIAGNOSIS — M6281 Muscle weakness (generalized): Secondary | ICD-10-CM

## 2024-07-04 DIAGNOSIS — M25562 Pain in left knee: Secondary | ICD-10-CM

## 2024-07-04 NOTE — Therapy (Signed)
 " OUTPATIENT PHYSICAL THERAPY TREATMENT    Patient Name: William Savage MRN: [id number] DOB:[birth year], 64 y.o., male Today's Date: 07/04/2024    END OF SESSION:  PT End of Session - 07/04/24 0847     Visit Number 25    Number of Visits 47    Date for Recertification  08/13/24    Authorization Type [facility]    Authorization Time Period 14 additional visits approved from 05/20/24-07/05/24    Authorization - Visit Number 5    Authorization - Number of Visits 14    PT Start Time [phone]    PT Stop Time 0930    PT Time Calculation (min) 43 min    Activity Tolerance Patient tolerated treatment well    Behavior During Therapy [facility] for tasks assessed/performed               Past Medical History:  Diagnosis Date   Ankle instability    bilateral   Chronic kidney disease    Diabetes (HCC)    Fatty liver    Fatty liver    Hyperlipidemia    Hypertension    IBS (irritable bowel syndrome)    Left shoulder strain    Migraines    Nonobstructive atherosclerosis of coronary artery    Plantar fasciitis    PTSD (post-traumatic stress disorder)    Radiculopathy    Sleep apnea    TIA (transient ischemic attack)    Past Surgical History:  Procedure Laterality Date   BUNIONECTOMY Right    COLONOSCOPY  2021   TOTAL KNEE ARTHROPLASTY Left 01/02/2024   Procedure: ARTHROPLASTY, KNEE, TOTAL;  Surgeon: Ernie Cough, MD;  Location: [facility];  Service: Orthopedics;  Laterality: Left;   Patient Active Problem List   Diagnosis Date Noted   S/P total knee arthroplasty, left 01/02/2024   S/P total knee replacement, left 01/02/2024   Effusion, left knee 03/27/2023   Idiopathic gout of multiple sites 03/27/2023   Radiculitis 03/27/2023   Chronic low back pain 03/27/2023   Osteoarthritis of left knee 03/27/2023   Bilateral plantar fasciitis 03/27/2023   REFERRING PROVIDER:  Ernie Cough, MD   REFERRING DIAG: [phone] (ICD-10-CM) - Presence of left artificial knee joint  S/p Lt  TKA  Rationale for Evaluation and Treatment: Rehabilitation  THERAPY DIAG:  Acute pain of left knee  Stiffness of left knee, not elsewhere classified  Difficulty in walking, not elsewhere classified  Muscle weakness (generalized)  ONSET DATE: DOS 01/02/24   SUBJECTIVE:  SUBJECTIVE STATEMENT: Pt states sore in quad today.   PERTINENT HISTORY:  Bilat feet N/T from time to time  PAIN:  Are you having pain? Yes: NPRS scale: 6/10 Pain location: Lt anterior knee Pain description: numb, tight Aggravating factors: sleeping is rough Relieving factors: movement/exercise  PRECAUTIONS:  None  RED FLAGS: None   WEIGHT BEARING RESTRICTIONS:  No  FALLS:  Has patient fallen in last 6 months? No  LIVING ENVIRONMENT: Stairs in home but has bedroom on first level  OCCUPATION:  retired  PLOF:  Independent  PATIENT GOALS:  Walking over a mile, yoga, upright stationary cycling   OBJECTIVE:  Note: Objective measures were completed at Evaluation unless otherwise noted.   PATIENT SURVEYS:  LEFS  Extreme difficulty/unable (0), Quite a bit of difficulty (1), Moderate difficulty (2), Little difficulty (3), No difficulty (4) Survey date:  02/21/24 03/12/24 06/18/24  Any of your usual work, housework or school activities 0 0   2. Usual hobbies, recreational or sporting activities 0 0   3. Getting into/out of the bath 1 1   4. Walking between rooms 1 2   5. Putting on socks/shoes 0 1   6. Squatting  0 0   7. Lifting an object, like a bag of groceries from the floor 1 3   8. Performing light activities around your home 2 2   9. Performing heavy activities around your home 0 0   10. Getting into/out of a car 1 2   11. Walking 2 blocks 0 1   12. Walking 1 mile 0 0   13. Going up/down 10 stairs (1  flight) 1 2   14. Standing for 1 hour 0 0   15.  sitting for 1 hour 1 1   16. Running on even ground 0 0   17. Running on uneven ground 0 0   18. Making sharp turns while running fast 0 0   19. Hopping  0 0   20. Rolling over in bed 1 2   Score total:  9/80 17/80     COGNITIVE STATUS: Within functional limits for tasks assessed   SENSATION: Anterior numbness  EDEMA:  Yes: mild as expected post op  GAIT: Comments: EVAL  rollator and cane available, lacking full knee ext at heel strike   Body Part #1 Knee  PALPATION: EVAL: limited scar/skin mobility around knee  LOWER EXTREMITY ROM:     Active  Left eval Left 02/28/24 Left  9/26 04/01/24 04/10/24 04/17/24 04/25/24 04/30/24 05/07/24 06/18/24 07/03/24 07/04/24  Knee flexion 84 95 AAROM 100 AAROM 108 AAROM 90 101 105 110 AAROM 105 after   85 supine, 105 AAROM  seated  105 AAROM 102 AAROM Improves to 105 102 passive 100 improves to 105  Knee extension -10  -13 -3 AAROM in supine -10 before edema massage  -7 after -12 before manual, 7 AROM after manual, 3 at end of session  -14, got to -6 resting and -3 AAROM -10, 4 AAROM  Lacking 7 AAROM  Lacking 7 AAROM Improves to lacking 6  Lacking 5 improves to lacking 4   (Blank rows = not tested)  Reassessment 06/18/24 Stairs: 7 inch alternating, increased effort required with ascending, decreased strength and motor control descending, lacking TKE on L Gait: ambulates lacking TKE    TREATMENT DATE: 07/04/24 Recumbent bike 6 min (seated #15) half revolutions moving to full Manual: patellar mobs, STM adductors, hamstrings, TKE extension, screw home grade IV; LAD with quad  set TRX squat 2 x 10 TRX lunge 1 x 10    07/03/24 Recumbent bike 6 min (seated #15) half revolutions moving to full P ROM left knee flexion with overpressure Heel prop with manual overpressure Patellar mobilizations Tibiofemoral mobilizations with posterior glide Roller to distal quad Prone knee flexion  passive measured 102 knee flexion passive Squats with hold at endrange 5 seconds x 5 Quadruped kneeling on plinth 15 seconds x 5 HEP update and review  06/26/24 Recumbent bike 6 min (seated #15) half revolutions moving to full Long axis distraction with Quad contraction Short sitting with OP into flexion Measurements throughout session starting at -12 extension, Reaching -8 in supine and -6 in standing with quad contraction Prone laying for 3 min with 7.5lb weight on ankle Prone laying with IASTYM to medial HS  Leg extension with increased weight with cues to maintain quad contraction with PT assist into extension Patella mobs Contract relax into ext with OP   06/18/24 Recumbent bike 6 min (seated #15) half revolutions moving to full Reassessment  Manual: patellar mobs, STM adductors, hamstrings, TKE extension, screw home grade IV; LAD with quad set   12/1  Recumbent bike 6 min (seated #15) half revolutions moving to full  TKE extension, screw home grade IV Knee flexion mob with IR (LAD with mob belt and ttable) grade IV  STM L medial HS  LLD stretching, heavy steady pressure for STM, longer duration holds using gym machines for overpressure     PATIENT EDUCATION:  Education details:  exercise form/rationale Person educated: Patient Education method: Explanation, Demonstration, Tactile cues, Verbal cues,  Education comprehension: verbalized understanding, returned demonstration, verbal cues required, tactile cues required, and needs further education  HOME EXERCISE PROGRAM: Access Code: [email or web address] URL: [email or web address] Date: 02/05/2024 Prepared by: Harlene Cordon  Program Notes massage skin around knee  Exercises - Seated Hamstring Stretch  - 2-3 x daily - 7 x weekly - 2 sets - 5 breaths hold - Standing Weight Shift  - 5 x daily - 7 x weekly - Stride Stance Weight Shift  - 5 x daily - 7 x weekly - Quadruped Rocking Backward  - 2-3 x daily -  7 x weekly - 1 sets - 10 reps - Standing Gluteal Sets  - Standing Quad Set    ASSESSMENT:  CLINICAL IMPRESSION: Began session on bike for dynamic warm up. Patient lacking 5 to 100 at beginning of session which improves to lacking 4 to 105. Trial of TRX exercises today to work into deeper flexion positions. Patient will continue to benefit from physical therapy in order to improve function and reduce impairment.     Initial Subjective Patient is a 64 y.o. M who was seen today for physical therapy evaluation and treatment for s/p Lt TKA. He is just over a month out, lacking ROM and wants to do the pool for therapy. Discussed use of aquatics and transition to land-based exercises, once he is independent in the pool, for functional workouts.  Pt is a member at [employer] so he has access to the pool. Will benefit from skilled PT to meet functional goals.     REHAB POTENTIAL: Good  CLINICAL DECISION MAKING: Stable/uncomplicated  EVALUATION COMPLEXITY: Low   GOALS: Goals reviewed with patient? Yes  SHORT TERM GOALS: Target date: 8/30  Knee ROM 0-90+ Baseline: Goal status: Partially Met 02/28/24; Met 03/19/24  2.  Demo proper heel toe gait without cuing Baseline:  Goal status:Met 02/28/24    LONG TERM GOALS:  Target date: POC date  Independent with long term exercise program in gym and pool Baseline:  Goal status: Met 03/19/24  2.  Knee ROM 0-120 Baseline:  Goal status: In progress 04/01/24  3.  Able to get in/out of chairs without UE assist, without increase in pain Baseline:  Goal status: Ongoing 04/01/24  4.  Demo SLS controlled balance for at least 10 s on surgical LE Baseline:  Goal status: Ongoing 04/01/24  PLAN:  PT FREQUENCY: 1-2x/week  PT DURATION: POC date  PLANNED INTERVENTIONS: 97164- PT Re-evaluation, 97750- Physical Performance Testing, 97110-Therapeutic exercises, 97530- Therapeutic activity, 97112- Neuromuscular re-education, 97535- Self Care, 02859-  Manual therapy, [phone]- Gait training, [phone]- Aquatic Therapy, [phone] (1-2 muscles), 20561 (3+ muscles)- Dry Needling, Patient/Family education, Balance training, Stair training, Taping, Joint mobilization, Spinal mobilization, Scar mobilization, and Cryotherapy.  PLAN FOR NEXT SESSION: continue progressive LE strengthening and Lt knee ROM.   Stairs - descending; STS without abdct of LLE.     Prentice GORMAN Stains, PT, DPT 07/04/2024, 9:31 AM    "

## 2024-07-12 ENCOUNTER — Encounter (HOSPITAL_BASED_OUTPATIENT_CLINIC_OR_DEPARTMENT_OTHER): Payer: Self-pay | Admitting: Physical Therapy

## 2024-07-12 ENCOUNTER — Ambulatory Visit (HOSPITAL_BASED_OUTPATIENT_CLINIC_OR_DEPARTMENT_OTHER): Payer: Self-pay | Admitting: Physical Therapy

## 2024-07-12 DIAGNOSIS — M25562 Pain in left knee: Secondary | ICD-10-CM

## 2024-07-12 DIAGNOSIS — M25662 Stiffness of left knee, not elsewhere classified: Secondary | ICD-10-CM

## 2024-07-12 DIAGNOSIS — M6281 Muscle weakness (generalized): Secondary | ICD-10-CM

## 2024-07-12 DIAGNOSIS — R262 Difficulty in walking, not elsewhere classified: Secondary | ICD-10-CM

## 2024-07-12 NOTE — Therapy (Signed)
 " OUTPATIENT PHYSICAL THERAPY TREATMENT    Patient Name: William Savage MRN: [id number] DOB:28-Mar-1961, 64 y.o., male Today's Date: 07/12/2024    END OF SESSION:  PT End of Session - 07/12/24 1351     Visit Number 26    Number of Visits 47    Date for Recertification  08/13/24    Authorization Type [facility]    Authorization Time Period 15 visits approved From 12.29.2025 - 10.02.2026    Authorization - Visit Number 5    Authorization - Number of Visits 15    PT Start Time 1349    PT Stop Time 1435    PT Time Calculation (min) 46 min    Activity Tolerance Patient tolerated treatment well    Behavior During Therapy WFL for tasks assessed/performed               Past Medical History:  Diagnosis Date   Ankle instability    bilateral   Chronic kidney disease    Diabetes (HCC)    Fatty liver    Fatty liver    Hyperlipidemia    Hypertension    IBS (irritable bowel syndrome)    Left shoulder strain    Migraines    Nonobstructive atherosclerosis of coronary artery    Plantar fasciitis    PTSD (post-traumatic stress disorder)    Radiculopathy    Sleep apnea    TIA (transient ischemic attack)    Past Surgical History:  Procedure Laterality Date   BUNIONECTOMY Right    COLONOSCOPY  2021   TOTAL KNEE ARTHROPLASTY Left 01/02/2024   Procedure: ARTHROPLASTY, KNEE, TOTAL;  Surgeon: Ernie Cough, MD;  Location: [facility];  Service: Orthopedics;  Laterality: Left;   Patient Active Problem List   Diagnosis Date Noted   S/P total knee arthroplasty, left 01/02/2024   S/P total knee replacement, left 01/02/2024   Effusion, left knee 03/27/2023   Idiopathic gout of multiple sites 03/27/2023   Radiculitis 03/27/2023   Chronic low back pain 03/27/2023   Osteoarthritis of left knee 03/27/2023   Bilateral plantar fasciitis 03/27/2023   REFERRING PROVIDER:  Ernie Cough, MD   REFERRING DIAG: [phone] (ICD-10-CM) - Presence of left artificial knee joint  S/p Lt  TKA  Rationale for Evaluation and Treatment: Rehabilitation  THERAPY DIAG:  Acute pain of left knee  Stiffness of left knee, not elsewhere classified  Difficulty in walking, not elsewhere classified  Muscle weakness (generalized)  ONSET DATE: DOS 01/02/24   SUBJECTIVE:  SUBJECTIVE STATEMENT: Pt states sore in quad today.   PERTINENT HISTORY:  Bilat feet N/T from time to time  PAIN:  Are you having pain? Yes: NPRS scale: 6/10 Pain location: Lt anterior knee Pain description: numb, tight Aggravating factors: sleeping is rough Relieving factors: movement/exercise  PRECAUTIONS:  None  RED FLAGS: None   WEIGHT BEARING RESTRICTIONS:  No  FALLS:  Has patient fallen in last 6 months? No  LIVING ENVIRONMENT: Stairs in home but has bedroom on first level  OCCUPATION:  retired  PLOF:  Independent  PATIENT GOALS:  Walking over a mile, yoga, upright stationary cycling   OBJECTIVE:  Note: Objective measures were completed at Evaluation unless otherwise noted.   PATIENT SURVEYS:  LEFS  Extreme difficulty/unable (0), Quite a bit of difficulty (1), Moderate difficulty (2), Little difficulty (3), No difficulty (4) Survey date:  02/21/24 03/12/24 06/18/24  Any of your usual work, housework or school activities 0 0   2. Usual hobbies, recreational or sporting activities 0 0   3. Getting into/out of the bath 1 1   4. Walking between rooms 1 2   5. Putting on socks/shoes 0 1   6. Squatting  0 0   7. Lifting an object, like a bag of groceries from the floor 1 3   8. Performing light activities around your home 2 2   9. Performing heavy activities around your home 0 0   10. Getting into/out of a car 1 2   11. Walking 2 blocks 0 1   12. Walking 1 mile 0 0   13. Going up/down 10 stairs (1  flight) 1 2   14. Standing for 1 hour 0 0   15.  sitting for 1 hour 1 1   16. Running on even ground 0 0   17. Running on uneven ground 0 0   18. Making sharp turns while running fast 0 0   19. Hopping  0 0   20. Rolling over in bed 1 2   Score total:  9/80 17/80     COGNITIVE STATUS: Within functional limits for tasks assessed   SENSATION: Anterior numbness  EDEMA:  Yes: mild as expected post op  GAIT: Comments: EVAL  rollator and cane available, lacking full knee ext at heel strike   Body Part #1 Knee  PALPATION: EVAL: limited scar/skin mobility around knee  LOWER EXTREMITY ROM:     Active  Left eval Left 02/28/24 Left  9/26 04/01/24 04/10/24 04/17/24 04/25/24 04/30/24 05/07/24 06/18/24 07/03/24 07/04/24 07/12/24  Knee flexion 84 95 AAROM 100 AAROM 108 AAROM 90 101 105 110 AAROM 105 after   85 supine, 105 AAROM  seated  105 AAROM 102 AAROM Improves to 105 102 passive 100 improves to 105 100 improves to 108  Knee extension -10  -13 -3 AAROM in supine -10 before edema massage  -7 after -12 before manual, 7 AROM after manual, 3 at end of session  -14, got to -6 resting and -3 AAROM -10, 4 AAROM  Lacking 7 AAROM  Lacking 7 AAROM Improves to lacking 6  Lacking 5 improves to lacking 4 Lacking 6   (Blank rows = not tested)  Reassessment 06/18/24 Stairs: 7 inch alternating, increased effort required with ascending, decreased strength and motor control descending, lacking TKE on L Gait: ambulates lacking TKE    TREATMENT DATE: 07/04/24 Recumbent bike 6 min (seated #15) half revolutions moving to full Manual: patellar mobs, STM adductors, hamstrings, TKE extension,  screw home grade IV; LAD with quad set Prone contract relax for flexion     07/04/24 Recumbent bike 6 min (seated #15) half revolutions moving to full Manual: patellar mobs, STM adductors, hamstrings, TKE extension, screw home grade IV; LAD with quad set TRX squat 2 x 10 TRX lunge 1 x  10    07/03/24 Recumbent bike 6 min (seated #15) half revolutions moving to full P ROM left knee flexion with overpressure Heel prop with manual overpressure Patellar mobilizations Tibiofemoral mobilizations with posterior glide Roller to distal quad Prone knee flexion passive measured 102 knee flexion passive Squats with hold at endrange 5 seconds x 5 Quadruped kneeling on plinth 15 seconds x 5 HEP update and review  06/26/24 Recumbent bike 6 min (seated #15) half revolutions moving to full Long axis distraction with Quad contraction Short sitting with OP into flexion Measurements throughout session starting at -12 extension, Reaching -8 in supine and -6 in standing with quad contraction Prone laying for 3 min with 7.5lb weight on ankle Prone laying with IASTYM to medial HS  Leg extension with increased weight with cues to maintain quad contraction with PT assist into extension Patella mobs Contract relax into ext with OP   06/18/24 Recumbent bike 6 min (seated #15) half revolutions moving to full Reassessment  Manual: patellar mobs, STM adductors, hamstrings, TKE extension, screw home grade IV; LAD with quad set   12/1  Recumbent bike 6 min (seated #15) half revolutions moving to full  TKE extension, screw home grade IV Knee flexion mob with IR (LAD with mob belt and ttable) grade IV  STM L medial HS  LLD stretching, heavy steady pressure for STM, longer duration holds using gym machines for overpressure     PATIENT EDUCATION:  Education details:  exercise form/rationale Person educated: Patient Education method: Explanation, Demonstration, Tactile cues, Verbal cues,  Education comprehension: verbalized understanding, returned demonstration, verbal cues required, tactile cues required, and needs further education  HOME EXERCISE PROGRAM: Access Code: [email or web address] URL: [email or web address] Date: 02/05/2024 Prepared by: Harlene Cordon  Program  Notes massage skin around knee  Exercises - Seated Hamstring Stretch  - 2-3 x daily - 7 x weekly - 2 sets - 5 breaths hold - Standing Weight Shift  - 5 x daily - 7 x weekly - Stride Stance Weight Shift  - 5 x daily - 7 x weekly - Quadruped Rocking Backward  - 2-3 x daily - 7 x weekly - 1 sets - 10 reps - Standing Gluteal Sets  - Standing Quad Set    ASSESSMENT:  CLINICAL IMPRESSION: Began session on bike for dynamic warm up. Patient lacking 6 to 100 at beginning of session which improves to lacking 5 to 108. Discussed current limitations and functional mobility and ROM and expectations. Patient will continue to benefit from physical therapy in order to improve function and reduce impairment.     Initial Subjective Patient is a 64 y.o. M who was seen today for physical therapy evaluation and treatment for s/p Lt TKA. He is just over a month out, lacking ROM and wants to do the pool for therapy. Discussed use of aquatics and transition to land-based exercises, once he is independent in the pool, for functional workouts.  Pt is a member at [facility] so he has access to the pool. Will benefit from skilled PT to meet functional goals.     REHAB POTENTIAL: Good  CLINICAL DECISION MAKING: Stable/uncomplicated  EVALUATION COMPLEXITY: Low   GOALS: Goals reviewed  with patient? Yes  SHORT TERM GOALS: Target date: 8/30  Knee ROM 0-90+ Baseline: Goal status: Partially Met 02/28/24; Met 03/19/24  2.  Demo proper heel toe gait without cuing Baseline:  Goal status:Met 02/28/24    LONG TERM GOALS: Target date: POC date  Independent with long term exercise program in gym and pool Baseline:  Goal status: Met 03/19/24  2.  Knee ROM 0-120 Baseline:  Goal status: In progress 04/01/24  3.  Able to get in/out of chairs without UE assist, without increase in pain Baseline:  Goal status: Ongoing 04/01/24  4.  Demo SLS controlled balance for at least 10 s on surgical LE Baseline:  Goal  status: Ongoing 04/01/24  PLAN:  PT FREQUENCY: 1-2x/week  PT DURATION: POC date  PLANNED INTERVENTIONS: 97164- PT Re-evaluation, 97750- Physical Performance Testing, 97110-Therapeutic exercises, 97530- Therapeutic activity, 97112- Neuromuscular re-education, 97535- Self Care, 02859- Manual therapy, [phone]- Gait training, [phone]- Aquatic Therapy, [phone] (1-2 muscles), [phone] (3+ muscles)- Dry Needling, Patient/Family education, Balance training, Stair training, Taping, Joint mobilization, Spinal mobilization, Scar mobilization, and Cryotherapy.  PLAN FOR NEXT SESSION: continue progressive LE strengthening and Lt knee ROM.   Stairs - descending; STS without abdct of LLE.     Prentice GORMAN Stains, PT, DPT 07/12/2024, 2:38 PM    "

## 2024-07-16 ENCOUNTER — Encounter (HOSPITAL_BASED_OUTPATIENT_CLINIC_OR_DEPARTMENT_OTHER): Payer: Self-pay | Admitting: Physical Therapy

## 2024-07-23 ENCOUNTER — Encounter (HOSPITAL_BASED_OUTPATIENT_CLINIC_OR_DEPARTMENT_OTHER): Admitting: Physical Therapy

## 2024-07-26 ENCOUNTER — Encounter (HOSPITAL_BASED_OUTPATIENT_CLINIC_OR_DEPARTMENT_OTHER): Payer: Self-pay | Admitting: Physical Therapy

## 2024-07-26 ENCOUNTER — Ambulatory Visit (HOSPITAL_BASED_OUTPATIENT_CLINIC_OR_DEPARTMENT_OTHER): Attending: Orthopedic Surgery | Admitting: Physical Therapy

## 2024-07-26 DIAGNOSIS — M6281 Muscle weakness (generalized): Secondary | ICD-10-CM

## 2024-07-26 DIAGNOSIS — R262 Difficulty in walking, not elsewhere classified: Secondary | ICD-10-CM

## 2024-07-26 DIAGNOSIS — M25662 Stiffness of left knee, not elsewhere classified: Secondary | ICD-10-CM

## 2024-07-26 DIAGNOSIS — M25562 Pain in left knee: Secondary | ICD-10-CM

## 2024-07-26 NOTE — Therapy (Signed)
 " OUTPATIENT PHYSICAL THERAPY TREATMENT    Patient Name: William Savage MRN: [id number] DOB:08/26/1960, 64 y.o., male Today's Date: 07/26/2024    END OF SESSION:  PT End of Session - 07/26/24 1153     Visit Number 27    Number of Visits 47    Date for Recertification  08/13/24    Authorization Type [facility]    Authorization Time Period 15 visits approved From 12.29.2025 - 10.02.2026    Authorization - Number of Visits 15    PT Start Time 1152    PT Stop Time 1234    PT Time Calculation (min) 42 min    Activity Tolerance Patient tolerated treatment well    Behavior During Therapy WFL for tasks assessed/performed               Past Medical History:  Diagnosis Date   Ankle instability    bilateral   Chronic kidney disease    Diabetes (HCC)    Fatty liver    Fatty liver    Hyperlipidemia    Hypertension    IBS (irritable bowel syndrome)    Left shoulder strain    Migraines    Nonobstructive atherosclerosis of coronary artery    Plantar fasciitis    PTSD (post-traumatic stress disorder)    Radiculopathy    Sleep apnea    TIA (transient ischemic attack)    Past Surgical History:  Procedure Laterality Date   BUNIONECTOMY Right    COLONOSCOPY  2021   TOTAL KNEE ARTHROPLASTY Left 01/02/2024   Procedure: ARTHROPLASTY, KNEE, TOTAL;  Surgeon: Ernie Cough, MD;  Location: [facility];  Service: Orthopedics;  Laterality: Left;   Patient Active Problem List   Diagnosis Date Noted   S/P total knee arthroplasty, left 01/02/2024   S/P total knee replacement, left 01/02/2024   Effusion, left knee 03/27/2023   Idiopathic gout of multiple sites 03/27/2023   Radiculitis 03/27/2023   Chronic low back pain 03/27/2023   Osteoarthritis of left knee 03/27/2023   Bilateral plantar fasciitis 03/27/2023   REFERRING PROVIDER:  Ernie Cough, MD   REFERRING DIAG: [phone] (ICD-10-CM) - Presence of left artificial knee joint  S/p Lt TKA  Rationale for Evaluation and Treatment:  Rehabilitation  THERAPY DIAG:  Acute pain of left knee  Stiffness of left knee, not elsewhere classified  Difficulty in walking, not elsewhere classified  Muscle weakness (generalized)  ONSET DATE: DOS 01/02/24   SUBJECTIVE:  SUBJECTIVE STATEMENT: Pt states continued tightness.   PERTINENT HISTORY:  Bilat feet N/T from time to time  PAIN:  Are you having pain? Yes: NPRS scale: 6/10 Pain location: Lt anterior knee Pain description: numb, tight Aggravating factors: sleeping is rough Relieving factors: movement/exercise  PRECAUTIONS:  None  RED FLAGS: None   WEIGHT BEARING RESTRICTIONS:  No  FALLS:  Has patient fallen in last 6 months? No  LIVING ENVIRONMENT: Stairs in home but has bedroom on first level  OCCUPATION:  retired  PLOF:  Independent  PATIENT GOALS:  Walking over a mile, yoga, upright stationary cycling   OBJECTIVE:  Note: Objective measures were completed at Evaluation unless otherwise noted.   PATIENT SURVEYS:  LEFS  Extreme difficulty/unable (0), Quite a bit of difficulty (1), Moderate difficulty (2), Little difficulty (3), No difficulty (4) Survey date:  02/21/24 03/12/24 06/18/24  Any of your usual work, housework or school activities 0 0   2. Usual hobbies, recreational or sporting activities 0 0   3. Getting into/out of the bath 1 1   4. Walking between rooms 1 2   5. Putting on socks/shoes 0 1   6. Squatting  0 0   7. Lifting an object, like a bag of groceries from the floor 1 3   8. Performing light activities around your home 2 2   9. Performing heavy activities around your home 0 0   10. Getting into/out of a car 1 2   11. Walking 2 blocks 0 1   12. Walking 1 mile 0 0   13. Going up/down 10 stairs (1 flight) 1 2   14. Standing for 1 hour 0 0    15.  sitting for 1 hour 1 1   16. Running on even ground 0 0   17. Running on uneven ground 0 0   18. Making sharp turns while running fast 0 0   19. Hopping  0 0   20. Rolling over in bed 1 2   Score total:  9/80 17/80     COGNITIVE STATUS: Within functional limits for tasks assessed   SENSATION: Anterior numbness  EDEMA:  Yes: mild as expected post op  GAIT: Comments: EVAL  rollator and cane available, lacking full knee ext at heel strike   Body Part #1 Knee  PALPATION: EVAL: limited scar/skin mobility around knee  LOWER EXTREMITY ROM:     Active  Left eval Left 02/28/24 Left  9/26 04/01/24 04/10/24 04/17/24 04/25/24 04/30/24 05/07/24 06/18/24 07/03/24 07/04/24 07/12/24 07/26/24  Knee flexion 84 95 AAROM 100 AAROM 108 AAROM 90 101 105 110 AAROM 105 after   85 supine, 105 AAROM  seated  105 AAROM 102 AAROM Improves to 105 102 passive 100 improves to 105 100 improves to 108 100 improves to 105 with manual  Knee extension -10  -13 -3 AAROM in supine -10 before edema massage  -7 after -12 before manual, 7 AROM after manual, 3 at end of session  -14, got to -6 resting and -3 AAROM -10, 4 AAROM  Lacking 7 AAROM  Lacking 7 AAROM Improves to lacking 6  Lacking 5 improves to lacking 4 Lacking 6 Lacking 6   (Blank rows = not tested)  Reassessment 06/18/24 Stairs: 7 inch alternating, increased effort required with ascending, decreased strength and motor control descending, lacking TKE on L Gait: ambulates lacking TKE    TREATMENT DATE: 07/26/24 Recumbent bike 6 min (seated #15) half revolutions moving to full Manual:  patellar mobs, STM adductors, hamstrings, TKE extension, screw home grade IV; LAD with quad set, knee flexion with overpressure and light distraction; pin and stretch adductors Lateral step down 4 inch 2 x 10 Forward step down 1 x 5 difficult SL Mini squat 3 way slide out 1 x 5   07/12/24 Recumbent bike 6 min (seated #15) half revolutions moving to  full Manual: patellar mobs, STM adductors, hamstrings, TKE extension, screw home grade IV; LAD with quad set Prone contract relax for flexion     07/04/24 Recumbent bike 6 min (seated #15) half revolutions moving to full Manual: patellar mobs, STM adductors, hamstrings, TKE extension, screw home grade IV; LAD with quad set TRX squat 2 x 10 TRX lunge 1 x 10    07/03/24 Recumbent bike 6 min (seated #15) half revolutions moving to full P ROM left knee flexion with overpressure Heel prop with manual overpressure Patellar mobilizations Tibiofemoral mobilizations with posterior glide Roller to distal quad Prone knee flexion passive measured 102 knee flexion passive Squats with hold at endrange 5 seconds x 5 Quadruped kneeling on plinth 15 seconds x 5 HEP update and review  06/26/24 Recumbent bike 6 min (seated #15) half revolutions moving to full Long axis distraction with Quad contraction Short sitting with OP into flexion Measurements throughout session starting at -12 extension, Reaching -8 in supine and -6 in standing with quad contraction Prone laying for 3 min with 7.5lb weight on ankle Prone laying with IASTYM to medial HS  Leg extension with increased weight with cues to maintain quad contraction with PT assist into extension Patella mobs Contract relax into ext with OP   06/18/24 Recumbent bike 6 min (seated #15) half revolutions moving to full Reassessment  Manual: patellar mobs, STM adductors, hamstrings, TKE extension, screw home grade IV; LAD with quad set   12/1  Recumbent bike 6 min (seated #15) half revolutions moving to full  TKE extension, screw home grade IV Knee flexion mob with IR (LAD with mob belt and ttable) grade IV  STM L medial HS  LLD stretching, heavy steady pressure for STM, longer duration holds using gym machines for overpressure     PATIENT EDUCATION:  Education details:  exercise form/rationale Person educated: Patient Education  method: Explanation, Demonstration, Tactile cues, Verbal cues,  Education comprehension: verbalized understanding, returned demonstration, verbal cues required, tactile cues required, and needs further education  HOME EXERCISE PROGRAM: Access Code: [email or web address] URL: [email or web address] Date: 02/05/2024 Prepared by: Harlene Cordon  Program Notes massage skin around knee  Exercises - Seated Hamstring Stretch  - 2-3 x daily - 7 x weekly - 2 sets - 5 breaths hold - Standing Weight Shift  - 5 x daily - 7 x weekly - Stride Stance Weight Shift  - 5 x daily - 7 x weekly - Quadruped Rocking Backward  - 2-3 x daily - 7 x weekly - 1 sets - 10 reps - Standing Gluteal Sets  - Standing Quad Set    ASSESSMENT:  CLINICAL IMPRESSION: Began session on bike for dynamic warm up. Patient lacking 6 to 100 at beginning of session which improves to lacking 5 to 105. Performed more functional strengthening due to continued deficits with stairs.  Patient will continue to benefit from physical therapy in order to improve function and reduce impairment.     Initial Subjective Patient is a 64 y.o. M who was seen today for physical therapy evaluation and treatment for s/p Lt TKA. He is just over a month  out, lacking ROM and wants to do the pool for therapy. Discussed use of aquatics and transition to land-based exercises, once he is independent in the pool, for functional workouts.  Pt is a member at [facility] so he has access to the pool. Will benefit from skilled PT to meet functional goals.     REHAB POTENTIAL: Good  CLINICAL DECISION MAKING: Stable/uncomplicated  EVALUATION COMPLEXITY: Low   GOALS: Goals reviewed with patient? Yes  SHORT TERM GOALS: Target date: 8/30  Knee ROM 0-90+ Baseline: Goal status: Partially Met 02/28/24; Met 03/19/24  2.  Demo proper heel toe gait without cuing Baseline:  Goal status:Met 02/28/24    LONG TERM GOALS: Target date: POC  date  Independent with long term exercise program in gym and pool Baseline:  Goal status: Met 03/19/24  2.  Knee ROM 0-120 Baseline:  Goal status: In progress 04/01/24  3.  Able to get in/out of chairs without UE assist, without increase in pain Baseline:  Goal status: Ongoing 04/01/24  4.  Demo SLS controlled balance for at least 10 s on surgical LE Baseline:  Goal status: Ongoing 04/01/24  PLAN:  PT FREQUENCY: 1-2x/week  PT DURATION: POC date  PLANNED INTERVENTIONS: 97164- PT Re-evaluation, 97750- Physical Performance Testing, 97110-Therapeutic exercises, 97530- Therapeutic activity, 97112- Neuromuscular re-education, 97535- Self Care, 02859- Manual therapy, [phone]- Gait training, [phone]- Aquatic Therapy, [phone] (1-2 muscles), 20561 (3+ muscles)- Dry Needling, Patient/Family education, Balance training, Stair training, Taping, Joint mobilization, Spinal mobilization, Scar mobilization, and Cryotherapy.  PLAN FOR NEXT SESSION: continue progressive LE strengthening and Lt knee ROM.   Stairs - descending; STS without abdct of LLE.     Prentice GORMAN Stains, PT, DPT 07/26/2024, 12:35 PM    "

## 2024-07-30 ENCOUNTER — Encounter (HOSPITAL_BASED_OUTPATIENT_CLINIC_OR_DEPARTMENT_OTHER): Admitting: Physical Therapy

## 2024-07-31 ENCOUNTER — Encounter (HOSPITAL_BASED_OUTPATIENT_CLINIC_OR_DEPARTMENT_OTHER)

## 2024-08-02 ENCOUNTER — Encounter (HOSPITAL_BASED_OUTPATIENT_CLINIC_OR_DEPARTMENT_OTHER): Admitting: Physical Therapy

## 2024-08-06 ENCOUNTER — Encounter (HOSPITAL_BASED_OUTPATIENT_CLINIC_OR_DEPARTMENT_OTHER): Admitting: Physical Therapy

## 2024-08-09 ENCOUNTER — Encounter (HOSPITAL_BASED_OUTPATIENT_CLINIC_OR_DEPARTMENT_OTHER): Admitting: Physical Therapy

## 2024-08-13 ENCOUNTER — Encounter (HOSPITAL_BASED_OUTPATIENT_CLINIC_OR_DEPARTMENT_OTHER): Admitting: Physical Therapy

## 2024-08-16 ENCOUNTER — Encounter (HOSPITAL_BASED_OUTPATIENT_CLINIC_OR_DEPARTMENT_OTHER): Admitting: Physical Therapy

## 2024-08-20 ENCOUNTER — Encounter (HOSPITAL_BASED_OUTPATIENT_CLINIC_OR_DEPARTMENT_OTHER)

## 2024-08-27 ENCOUNTER — Encounter (HOSPITAL_BASED_OUTPATIENT_CLINIC_OR_DEPARTMENT_OTHER)

## 2024-09-10 ENCOUNTER — Encounter (HOSPITAL_BASED_OUTPATIENT_CLINIC_OR_DEPARTMENT_OTHER): Admitting: Physical Therapy
